# Patient Record
Sex: Female | Born: 1980
Health system: Southern US, Community
[De-identification: ages and names within clinical notes are randomized; demographics above are authoritative.]

## PROBLEM LIST (undated history)

## (undated) ENCOUNTER — Inpatient Hospital Stay (HOSPITAL_COMMUNITY): Payer: Self-pay

## (undated) DIAGNOSIS — R87629 Unspecified abnormal cytological findings in specimens from vagina: Secondary | ICD-10-CM

## (undated) DIAGNOSIS — R51 Headache: Secondary | ICD-10-CM

## (undated) DIAGNOSIS — O165 Unspecified maternal hypertension, complicating the puerperium: Secondary | ICD-10-CM

## (undated) DIAGNOSIS — E569 Vitamin deficiency, unspecified: Secondary | ICD-10-CM

## (undated) DIAGNOSIS — O26899 Other specified pregnancy related conditions, unspecified trimester: Secondary | ICD-10-CM

## (undated) DIAGNOSIS — R12 Heartburn: Secondary | ICD-10-CM

## (undated) DIAGNOSIS — D649 Anemia, unspecified: Secondary | ICD-10-CM

## (undated) DIAGNOSIS — E119 Type 2 diabetes mellitus without complications: Secondary | ICD-10-CM

## (undated) DIAGNOSIS — I498 Other specified cardiac arrhythmias: Secondary | ICD-10-CM

## (undated) DIAGNOSIS — G35 Multiple sclerosis: Secondary | ICD-10-CM

## (undated) DIAGNOSIS — K8020 Calculus of gallbladder without cholecystitis without obstruction: Secondary | ICD-10-CM

## (undated) DIAGNOSIS — I471 Supraventricular tachycardia, unspecified: Secondary | ICD-10-CM

## (undated) DIAGNOSIS — I951 Orthostatic hypotension: Secondary | ICD-10-CM

## (undated) DIAGNOSIS — D62 Acute posthemorrhagic anemia: Secondary | ICD-10-CM

## (undated) DIAGNOSIS — G90A Postural orthostatic tachycardia syndrome (POTS): Secondary | ICD-10-CM

## (undated) DIAGNOSIS — IMO0001 Reserved for inherently not codable concepts without codable children: Secondary | ICD-10-CM

## (undated) DIAGNOSIS — K589 Irritable bowel syndrome without diarrhea: Secondary | ICD-10-CM

## (undated) DIAGNOSIS — R000 Tachycardia, unspecified: Secondary | ICD-10-CM

## (undated) DIAGNOSIS — I499 Cardiac arrhythmia, unspecified: Secondary | ICD-10-CM

## (undated) DIAGNOSIS — O24419 Gestational diabetes mellitus in pregnancy, unspecified control: Secondary | ICD-10-CM

## (undated) HISTORY — DX: Irritable bowel syndrome, unspecified: K58.9

## (undated) HISTORY — DX: Calculus of gallbladder without cholecystitis without obstruction: K80.20

## (undated) HISTORY — DX: Type 2 diabetes mellitus without complications: E11.9

## (undated) HISTORY — PX: COLPOSCOPY: SHX161

## (undated) HISTORY — PX: WISDOM TOOTH EXTRACTION: SHX21

---
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
## 2000-07-03 ENCOUNTER — Encounter: Payer: Self-pay | Admitting: Emergency Medicine

## 2000-07-03 ENCOUNTER — Emergency Department (HOSPITAL_COMMUNITY): Admission: EM | Admit: 2000-07-03 | Discharge: 2000-07-03 | Payer: Self-pay | Admitting: *Deleted

## 2000-07-10 ENCOUNTER — Emergency Department (HOSPITAL_COMMUNITY): Admission: EM | Admit: 2000-07-10 | Discharge: 2000-07-10 | Payer: Self-pay | Admitting: Obstetrics and Gynecology

## 2001-08-27 HISTORY — PX: ATRIAL ABLATION SURGERY: SHX560

## 2001-10-03 ENCOUNTER — Ambulatory Visit (HOSPITAL_COMMUNITY): Admission: RE | Admit: 2001-10-03 | Discharge: 2001-10-03 | Payer: Self-pay | Admitting: Family Medicine

## 2001-10-03 ENCOUNTER — Encounter: Payer: Self-pay | Admitting: Family Medicine

## 2001-10-20 ENCOUNTER — Encounter: Admission: RE | Admit: 2001-10-20 | Discharge: 2002-01-18 | Payer: Self-pay | Admitting: Family Medicine

## 2001-12-14 ENCOUNTER — Encounter: Payer: Self-pay | Admitting: Emergency Medicine

## 2001-12-14 ENCOUNTER — Emergency Department (HOSPITAL_COMMUNITY): Admission: EM | Admit: 2001-12-14 | Discharge: 2001-12-14 | Payer: Self-pay | Admitting: Emergency Medicine

## 2002-01-20 ENCOUNTER — Other Ambulatory Visit: Admission: RE | Admit: 2002-01-20 | Discharge: 2002-01-20 | Payer: Self-pay | Admitting: Obstetrics and Gynecology

## 2002-12-24 ENCOUNTER — Other Ambulatory Visit: Admission: RE | Admit: 2002-12-24 | Discharge: 2002-12-24 | Payer: Self-pay | Admitting: *Deleted

## 2003-09-12 ENCOUNTER — Emergency Department (HOSPITAL_COMMUNITY): Admission: AD | Admit: 2003-09-12 | Discharge: 2003-09-12 | Payer: Self-pay | Admitting: Family Medicine

## 2004-01-06 ENCOUNTER — Other Ambulatory Visit: Admission: RE | Admit: 2004-01-06 | Discharge: 2004-01-06 | Payer: Self-pay | Admitting: Obstetrics and Gynecology

## 2004-02-04 ENCOUNTER — Ambulatory Visit (HOSPITAL_COMMUNITY): Admission: RE | Admit: 2004-02-04 | Discharge: 2004-02-06 | Payer: Self-pay | Admitting: Internal Medicine

## 2004-02-10 ENCOUNTER — Ambulatory Visit (HOSPITAL_COMMUNITY): Admission: RE | Admit: 2004-02-10 | Discharge: 2004-02-10 | Payer: Self-pay | Admitting: Internal Medicine

## 2005-02-18 ENCOUNTER — Inpatient Hospital Stay (HOSPITAL_COMMUNITY): Admission: AD | Admit: 2005-02-18 | Discharge: 2005-02-18 | Payer: Self-pay | Admitting: Obstetrics and Gynecology

## 2005-04-23 ENCOUNTER — Inpatient Hospital Stay (HOSPITAL_COMMUNITY): Admission: RE | Admit: 2005-04-23 | Discharge: 2005-04-26 | Payer: Self-pay | Admitting: Obstetrics and Gynecology

## 2005-04-27 ENCOUNTER — Encounter: Admission: RE | Admit: 2005-04-27 | Discharge: 2005-05-26 | Payer: Self-pay | Admitting: Obstetrics and Gynecology

## 2005-05-27 ENCOUNTER — Encounter: Admission: RE | Admit: 2005-05-27 | Discharge: 2005-06-26 | Payer: Self-pay | Admitting: Obstetrics and Gynecology

## 2005-06-27 ENCOUNTER — Encounter: Admission: RE | Admit: 2005-06-27 | Discharge: 2005-07-26 | Payer: Self-pay | Admitting: Obstetrics and Gynecology

## 2005-07-27 ENCOUNTER — Encounter: Admission: RE | Admit: 2005-07-27 | Discharge: 2005-08-26 | Payer: Self-pay | Admitting: Obstetrics and Gynecology

## 2005-08-27 ENCOUNTER — Encounter: Admission: RE | Admit: 2005-08-27 | Discharge: 2005-09-26 | Payer: Self-pay | Admitting: Obstetrics and Gynecology

## 2005-09-27 ENCOUNTER — Encounter: Admission: RE | Admit: 2005-09-27 | Discharge: 2005-10-24 | Payer: Self-pay | Admitting: Obstetrics and Gynecology

## 2005-10-25 ENCOUNTER — Encounter: Admission: RE | Admit: 2005-10-25 | Discharge: 2005-11-24 | Payer: Self-pay | Admitting: Obstetrics and Gynecology

## 2005-11-25 ENCOUNTER — Encounter: Admission: RE | Admit: 2005-11-25 | Discharge: 2005-12-24 | Payer: Self-pay | Admitting: Obstetrics and Gynecology

## 2005-12-25 ENCOUNTER — Encounter: Admission: RE | Admit: 2005-12-25 | Discharge: 2006-01-24 | Payer: Self-pay | Admitting: Obstetrics and Gynecology

## 2006-01-25 ENCOUNTER — Encounter: Admission: RE | Admit: 2006-01-25 | Discharge: 2006-02-24 | Payer: Self-pay | Admitting: Obstetrics and Gynecology

## 2006-05-05 ENCOUNTER — Emergency Department (HOSPITAL_COMMUNITY): Admission: EM | Admit: 2006-05-05 | Discharge: 2006-05-05 | Payer: Self-pay | Admitting: Emergency Medicine

## 2006-12-16 ENCOUNTER — Inpatient Hospital Stay (HOSPITAL_COMMUNITY): Admission: EM | Admit: 2006-12-16 | Discharge: 2006-12-16 | Payer: Self-pay | Admitting: Emergency Medicine

## 2007-01-02 ENCOUNTER — Emergency Department (HOSPITAL_COMMUNITY): Admission: EM | Admit: 2007-01-02 | Discharge: 2007-01-02 | Payer: Self-pay | Admitting: Emergency Medicine

## 2008-05-17 ENCOUNTER — Ambulatory Visit (HOSPITAL_COMMUNITY): Admission: RE | Admit: 2008-05-17 | Discharge: 2008-05-17 | Payer: Self-pay | Admitting: Neurology

## 2008-06-02 ENCOUNTER — Ambulatory Visit (HOSPITAL_COMMUNITY): Admission: RE | Admit: 2008-06-02 | Discharge: 2008-06-02 | Payer: Self-pay | Admitting: Neurology

## 2008-06-05 ENCOUNTER — Emergency Department (HOSPITAL_COMMUNITY): Admission: EM | Admit: 2008-06-05 | Discharge: 2008-06-05 | Payer: Self-pay | Admitting: Emergency Medicine

## 2008-06-07 ENCOUNTER — Encounter: Admission: RE | Admit: 2008-06-07 | Discharge: 2008-06-07 | Payer: Self-pay | Admitting: Neurology

## 2009-07-01 ENCOUNTER — Emergency Department (HOSPITAL_COMMUNITY): Admission: EM | Admit: 2009-07-01 | Discharge: 2009-07-02 | Payer: Self-pay | Admitting: Emergency Medicine

## 2009-08-16 ENCOUNTER — Ambulatory Visit (HOSPITAL_COMMUNITY): Admission: RE | Admit: 2009-08-16 | Discharge: 2009-08-16 | Payer: Self-pay | Admitting: Gastroenterology

## 2010-07-11 ENCOUNTER — Ambulatory Visit (HOSPITAL_COMMUNITY): Admission: RE | Admit: 2010-07-11 | Discharge: 2010-07-11 | Payer: Self-pay | Admitting: Neurology

## 2010-10-16 ENCOUNTER — Other Ambulatory Visit (HOSPITAL_COMMUNITY): Payer: Self-pay | Admitting: Neurology

## 2010-10-16 DIAGNOSIS — R42 Dizziness and giddiness: Secondary | ICD-10-CM

## 2010-10-20 ENCOUNTER — Ambulatory Visit (HOSPITAL_COMMUNITY)
Admission: RE | Admit: 2010-10-20 | Discharge: 2010-10-20 | Disposition: A | Discharge status: Home / Self Care | Payer: 59 | Source: Ambulatory Visit | Attending: Neurology | Admitting: Neurology

## 2010-10-20 DIAGNOSIS — R42 Dizziness and giddiness: Secondary | ICD-10-CM

## 2010-10-20 DIAGNOSIS — G35 Multiple sclerosis: Secondary | ICD-10-CM | POA: Insufficient documentation

## 2010-10-20 DIAGNOSIS — R209 Unspecified disturbances of skin sensation: Secondary | ICD-10-CM | POA: Insufficient documentation

## 2010-10-20 DIAGNOSIS — M5020 Other cervical disc displacement, unspecified cervical region: Secondary | ICD-10-CM | POA: Insufficient documentation

## 2010-10-20 MED ORDER — GADOBENATE DIMEGLUMINE 529 MG/ML IV SOLN
13.0000 mL | Freq: Once | INTRAVENOUS | Status: AC | PRN
  Administered 2010-10-20: 13 mL via INTRAVENOUS

## 2010-10-20 MED ORDER — GADOBENATE DIMEGLUMINE 529 MG/ML IV SOLN: 13.0000 mL | Freq: Once | INTRAVENOUS | Status: DC | PRN

## 2010-11-29 LAB — CBC
HCT: 41.6 % (ref 36.0–46.0)
Hemoglobin: 14.4 g/dL (ref 12.0–15.0)
MCHC: 34.7 g/dL (ref 30.0–36.0)
MCV: 94.7 fL (ref 78.0–100.0)
Platelets: 144 10*3/uL — ABNORMAL LOW (ref 150–400)
RBC: 4.39 MIL/uL (ref 3.87–5.11)
RDW: 12.6 % (ref 11.5–15.5)
WBC: 7.8 10*3/uL (ref 4.0–10.5)

## 2010-11-29 LAB — URINALYSIS, ROUTINE W REFLEX MICROSCOPIC
Bilirubin Urine: NEGATIVE
Glucose, UA: NEGATIVE mg/dL
Hgb urine dipstick: NEGATIVE
Nitrite: NEGATIVE
Protein, ur: NEGATIVE mg/dL
Specific Gravity, Urine: 1.018 (ref 1.005–1.030)
Urobilinogen, UA: 1 mg/dL (ref 0.0–1.0)
pH: 5.5 (ref 5.0–8.0)

## 2010-11-29 LAB — DIFFERENTIAL
Basophils Absolute: 0 10*3/uL (ref 0.0–0.1)
Basophils Relative: 0 % (ref 0–1)
Eosinophils Absolute: 0.2 10*3/uL (ref 0.0–0.7)
Eosinophils Relative: 2 % (ref 0–5)
Lymphocytes Relative: 13 % (ref 12–46)
Lymphs Abs: 1 10*3/uL (ref 0.7–4.0)
Monocytes Absolute: 0.5 10*3/uL (ref 0.1–1.0)
Monocytes Relative: 7 % (ref 3–12)
Neutro Abs: 6.1 10*3/uL (ref 1.7–7.7)
Neutrophils Relative %: 78 % — ABNORMAL HIGH (ref 43–77)

## 2010-11-29 LAB — COMPREHENSIVE METABOLIC PANEL
ALT: 14 U/L (ref 0–35)
AST: 18 U/L (ref 0–37)
Albumin: 4.1 g/dL (ref 3.5–5.2)
Alkaline Phosphatase: 49 U/L (ref 39–117)
BUN: 8 mg/dL (ref 6–23)
CO2: 27 mEq/L (ref 19–32)
Calcium: 8.7 mg/dL (ref 8.4–10.5)
Chloride: 101 mEq/L (ref 96–112)
Creatinine, Ser: 0.78 mg/dL (ref 0.4–1.2)
GFR calc non Af Amer: >60 mL/min (ref >60)
Glucose, Bld: 108 mg/dL — ABNORMAL HIGH (ref 70–99)
Potassium: 3.8 mEq/L (ref 3.5–5.1)
Sodium: 135 mEq/L (ref 135–145)
Total Bilirubin: 2.1 mg/dL — ABNORMAL HIGH (ref 0.3–1.2)
Total Protein: 6.9 g/dL (ref 6.0–8.3)

## 2010-11-29 LAB — URINE MICROSCOPIC-ADD ON

## 2010-11-29 LAB — PREGNANCY, URINE

## 2011-01-12 NOTE — Discharge Summary (Signed)
NAME:  Karen Powers, Karen Powers NO.:  [id number]   MEDICAL RECORD NO.:  [id number]          PATIENT TYPE:  INP   LOCATION:  9118                          FACILITY:  [facility]   PHYSICIAN:  Lenoard Aden, M.D.DATE OF BIRTH:  12/28/1980   DATE OF ADMISSION:  04/23/2005  DATE OF DISCHARGE:  04/26/2005                                 DISCHARGE SUMMARY   The patient underwent an uncomplicated primary cesarean section for  persistent breech at term on April 21, 2005. Postoperative course  uncomplicated.  Tolerated diet well.  Discharged home on day #3.  Discharge  teaching done.  Tylox, prenatal vitamins and iron given.  Hospital course  uncomplicated.  Follow up in the office in four to six weeks.      Lenoard Aden, M.D.  Electronically Signed     RJT/MEDQ  D:  06/24/2005  T:  06/25/2005  Job:  [id number]

## 2011-01-12 NOTE — Op Note (Signed)
NAME:  Karen Powers, Karen Powers NO.:  [id number]   MEDICAL RECORD NO.:  [id number]                   PATIENT TYPE:  OIB   LOCATION:  2027                                 FACILITY:  [facility]   PHYSICIAN:  Duke Salvia, M.D.               DATE OF BIRTH:  02-May-1981   DATE OF PROCEDURE:  02/04/2004  DATE OF DISCHARGE:                                 OPERATIVE REPORT   PREOPERATIVE DIAGNOSIS:  Long RP tachycardia.   POSTOPERATIVE DIAGNOSIS:  No inducible arrhythmias.   PROCEDURE:  Invasive electrophysiological study, isoproterenol infusion and  arrhythmia mapping.   SURGEON:  Duke Salvia, M.D.   Following obtaining informed consent, the patient was brought to the  electrophysiology laboratory and placed on the fluoroscopic table in supine  position.  After routine prep and drape, cardiac catheterization was  performed with local anesthesia and conscious sedation.  Noninvasive blood  pressure monitoring, transcutaneous oxygen saturation monitoring and  nontidal CO2 monitoring were performed and remained continued stable  throughout the procedure.   Following the procedure, the catheters were removed, hemostasis was obtained  and the patient was transferred to the floor in stable condition.   CATHETERS:  1. The catheters used were 5-French catheter inserted into the left femoral     vein via the AV junction to measure the His electrogram.  2. A 9-French ESI endocardial mapping array was inserted via the left     femoral vein to the right atrium.  3. A 6-French octapolar catheter was inserted via the right femoral vein to     the coronary sinus.  4. A 7-French 5-mm deflectable tip ablation catheter was then inserted into     the right atrium for creation of a geometry and mapping.  5. A 5-French His catheter was subsequently removed to the right ventricular     apex.   Service leads I, AVF and V1 were monitored continuously throughout the  procedure.   Following insertion of the catheters, a stimulation protocol was  initiated incremental atrial pacing.   Incremental ventricular pacing.   Single atrial extrastimuli base cycling length of 600 milliseconds.   Double atriotomy stimuli the presence/absence of isoproterenol at pacing  cycle length of 403, 50 milliseconds.   Burst atrial pacing from 300 to 200 milliseconds.   Single ventricular extrastimuli pace cycle length of 400 milliseconds.   RESULTS:  1. Service electrocardiogram basic intervals.     A. Sinus.     B. Cycle length 584 milliseconds.     C. P-R interval 140 milliseconds.     D. QRS duration is 77 milliseconds.     E. Q-T interval is 353 milliseconds.     F. P-wave duration is 115 milliseconds.     G. Bundle branch block was absent.     H. Pre-excitation was absent.  2. A-H interval was 57 milliseconds.  3. H-V interval was 44 milliseconds.  4. His duration was 14 milliseconds.   AV NODAL FUNCTION:  1. The AV Wenckebach cycle length was 350 milliseconds.  2. VA Wenckebach cycle length was less than 400 milliseconds.  3. The AV nodal cycle length for a fraction appeared base cycle 600     milliseconds with 250 milliseconds and AV conduction was continuous, both     single and double atrial extrastimuli.  4. No accessory pathway was evident.   ARRHYTHMIAS INDUCED:  No sustained arrhythmia could be induced in the  presence or in the absence of isoproterenol with the above-mentioned  stimulation protocol with isoproterenol graded from 1 to 4 mcg per minute.   IMPRESSION:  1. Arrhythmias were induced, appeared to have quite limited to 3-4 beats and     appeared to have an origin in this sinus node area.  This was     demonstrated by both electrogram mapping as well as endocardial array     mapping.  2. Inappropriate sinus tachycardias.  3. Normal atrial function without inducible arrhythmia.  4. Normal AV nodal function without dual AV nodal physiology.  5.  No accessory pathways.  6. Normal __________ system function.  7. No normal ventricular response to program stimulation.   CONCLUSION:  The results of the electrophysiologic testing failed to  identify an arrhythmia mechanism, the patient's tachy palpitations  notwithstanding the fact that the event monitor had shown the transition of  heart rates from cycle length of about 800 to 450 milliseconds over 5 to 10  seconds.  No specific abnormality could be identified on the testing.   RECOMMENDATIONS:  1. Salt and water repletion.  2. Assess for dysautonomia.  3. Beta blockers as necessary.                                               Duke Salvia, M.D.    SCK/MEDQ  D:  02/04/2004  T:  02/05/2004  Job:  [id number]   cc:   Cristy Hilts. Jacinto Halim, M.D.  [address]  [place]  [place] [address]  Fax: [phone]   Electrophysiology Lab

## 2011-01-12 NOTE — Op Note (Signed)
NAME:  Karen Powers, Karen Powers NO.:  [id number]   MEDICAL RECORD NO.:  [id number]                   PATIENT TYPE:  OIB   LOCATION:  2899                                 FACILITY:  [facility]   PHYSICIAN:  Duke Salvia, M.D.               DATE OF BIRTH:  04-Jun-1981   DATE OF PROCEDURE:  02/11/2004  DATE OF DISCHARGE:  02/10/2004                                 OPERATIVE REPORT   POSTOPERATIVE DIAGNOSES:  1. Syncope.  2. Tachypalpitations.   POSTOPERATIVE DIAGNOSIS:  Postural orthostatic tachycardia.   Following equilibration in the supine position with blood pressures about  110 with a pulse of about 75, the patient was subjected to head-up tilt  table testing.  After about 10 to 12 minutes, her heart rate had escalated  to the 120 range without significant change in blood pressure.  The  patient's heart rate then gradually returned to normal.  These symptoms  associated with the tachycardia reproduced her clinical symptoms.   IMPRESSION:  Postural orthostatic tachycardia.   RECOMMENDATIONS:  Salt and water repletion and consideration of beta  blockade.                                               Duke Salvia, M.D.    SCK/MEDQ  D:  02/10/2004  T:  02/11/2004  Job:  [id number]   cc:   Cristy Hilts. Jacinto Halim, M.D.  [address]  [place]  [place] [address]  Fax: [phone]

## 2011-01-12 NOTE — Op Note (Signed)
NAME:  Karen Powers, Karen Powers NO.:  [id number]   MEDICAL RECORD NO.:  [id number]          PATIENT TYPE:  INP   LOCATION:  9118                          [facility]   PHYSICIAN:  Lenoard Aden, M.D.DATE OF BIRTH:  01/05/1981   DATE OF PROCEDURE:  DATE OF DISCHARGE:                                 OPERATIVE REPORT   PREOPERATIVE DIAGNOSIS:  A 39-week intrauterine pregnancy with breech mal  presentation.   POSTOPERATIVE DIAGNOSIS:  A 39-week intrauterine pregnancy with breech mal  presentation.   PROCEDURE:  Primary low segment transverse cesarean section.   ASSISTANT:  Pershing Cox, M.D.   ANESTHESIA:  Spinal by Dr. Arby Barrette.   ESTIMATED BLOOD LOSS:  1000 mL.   COMPLICATIONS:  None.   DRAINS:  Foley.   DISPOSITION:  Patient went to recovery in good condition.   FINDINGS:  Full-term living female 8 pounds 14 ounces in frank breech  position.  Cord blood collected.  Placenta posterior.  Normal tubes, normal  ovaries.   BRIEF OPERATIVE NOTE:  Being apprised of the risks of anesthesia, infection,  bleeding, intra-abdominal injury with need for repair, the delayed risks,  and immediate complications to include bowel and bladder injury, the patient  is brought to the operating room.  She is administered a spinal anesthetic  without complications.  Prepped and draped in the usual sterile fashion.  Foley catheter placed.  After achieving adequate anesthesia, dilute Marcaine  solution placed in the area of the Pfannenstiel skin incision, which was  made with the scalpel and carried down to the fascia which was nicked in the  midline and opened transversely using the Mayo scissors.  Rectus muscles  dissected sharply in the midline and bladder flap created using sharp  dissection and dissected sharply off the lower uterine segment.  Curved  hysterotomy incision made.  Atraumatic delivery using the usual maneuvers  with a full-term living female handed to the  pediatricians in attendance.  Apgars 8 and 9.  Cord blood collected.  Placenta remained intact.  Three-  vessel cord noted.  Uterus is closed in two layers after being curetted and  a dry laparotomy pad used.  It is closed in two layers using the 0 Monocryl  suture.  Normal tubes and ovaries noted as previously  noted.  The bladder flap was inspected and found to be hemostatic.  Irrigation accomplished.  The fascia closed using a 0 Monocryl.  Skin closed  with staples.  The patient tolerated the procedure well and was transferred  to the recovery room in good condition.      Lenoard Aden, M.D.  Electronically Signed     RJT/MEDQ  D:  04/23/2005  T:  04/23/2005  Job:  [id number]   cc:   Marvel Plan

## 2011-01-12 NOTE — H&P (Signed)
NAME:  Karen Powers, Karen Powers NO.:  [id number]   MEDICAL RECORD NO.:  [id number]          PATIENT TYPE:  INP   LOCATION:  NA                            [facility]   PHYSICIAN:  Lenoard Aden, M.D.DATE OF BIRTH:  1981/01/09   DATE OF ADMISSION:  DATE OF DISCHARGE:                                HISTORY & PHYSICAL   CHIEF COMPLAINT:  Breech presentation at 39 weeks, desire for primary  cesarean section.   HISTORY OF PRESENT ILLNESS:  The patient is a 30 year old white female,  gravida 2, para 2, EDD April 28, 2005, at 39+ weeks with persistent  breech presentation at term for primary cesarean section.   MEDICATIONS:  Prenatal vitamins and prednisone.   PAST MEDICAL HISTORY:  Remarkable for supraventricular tachycardia currently  on no medications.  History of third trimester pregnancy-related itching  currently on prednisone Dosepak.  The patient has a personal of an SAB with  no D&C.  She is a nonsmoker and nondrinker.  She denies domestic or physical  violence.  She has a personal history of cardiac ablation.  She has a family  history of alcohol abuse, colon and lung cancer, cerebral hemorrhage,  multiple sclerosis, adult onset diabetes mellitus.  She has a personal  history of postural orthostatic hypotension.   PRENATAL LABORATORY DATA:  Blood type O negative, Rh antibody negative,  rubella immune, hepatitis and HIV nonreactive, GBS is negative.   PREGNANCY COURSE:  Complicated by preterm contractions and persistent breech  presentation.   PHYSICAL EXAMINATION:  GENERAL:  She is a well-developed, well-nourished  white female in no acute distress.  HEENT:  Normal.  LUNGS:  Clear.  HEART:  Regular rate and rhythm.  ABDOMEN:  Soft, gravid, and nontender.  Estimated fetal weight of 7-1/2 to 8  pounds.  PELVIC:  Cervix is closed, 2 cm long, breech, and -2.  EXTREMITIES:  No cords.  NEUROLOGY:  Nonfocal.   IMPRESSION:  1.  39-week obstetric.  2.   Persistent breech presentation.  3.  History of postural orthostatic tachycardia, status post cardiac      ablation.  4.  Rh negative.  5.  History of third trimester itching, currently on prednisone.   PLAN:  Proceed with primary cesarean section.  Risks, benefits discussed.  Risks of anesthesia, infection, bleeding, injury to abdominal organs with  need for repair noted, delayed versus immediate complications to include  bowel and bladder injury noted.  The patient acknowledges and declines  external cephalic version and wishes to proceed.      Lenoard Aden, M.D.  Electronically Signed     RJT/MEDQ  D:  04/22/2005  T:  04/22/2005  Job:  [id number]

## 2011-03-22 ENCOUNTER — Ambulatory Visit: Payer: No Typology Code available for payment source | Attending: Family Medicine | Admitting: Physical Therapy

## 2011-03-22 DIAGNOSIS — IMO0001 Reserved for inherently not codable concepts without codable children: Secondary | ICD-10-CM | POA: Insufficient documentation

## 2011-03-22 DIAGNOSIS — M545 Low back pain, unspecified: Secondary | ICD-10-CM | POA: Insufficient documentation

## 2011-03-22 DIAGNOSIS — M2560 Stiffness of unspecified joint, not elsewhere classified: Secondary | ICD-10-CM | POA: Insufficient documentation

## 2011-03-22 DIAGNOSIS — R5381 Other malaise: Secondary | ICD-10-CM | POA: Insufficient documentation

## 2011-03-26 ENCOUNTER — Ambulatory Visit: Payer: No Typology Code available for payment source | Admitting: Physical Therapy

## 2011-03-29 ENCOUNTER — Ambulatory Visit: Payer: No Typology Code available for payment source | Attending: Family Medicine | Admitting: Physical Therapy

## 2011-03-29 DIAGNOSIS — R5381 Other malaise: Secondary | ICD-10-CM | POA: Insufficient documentation

## 2011-03-29 DIAGNOSIS — M2560 Stiffness of unspecified joint, not elsewhere classified: Secondary | ICD-10-CM | POA: Insufficient documentation

## 2011-03-29 DIAGNOSIS — IMO0001 Reserved for inherently not codable concepts without codable children: Secondary | ICD-10-CM | POA: Insufficient documentation

## 2011-03-29 DIAGNOSIS — M545 Low back pain, unspecified: Secondary | ICD-10-CM | POA: Insufficient documentation

## 2011-04-02 ENCOUNTER — Ambulatory Visit: Payer: No Typology Code available for payment source | Admitting: *Deleted

## 2011-04-05 ENCOUNTER — Ambulatory Visit: Payer: No Typology Code available for payment source | Admitting: Physical Therapy

## 2011-04-09 ENCOUNTER — Ambulatory Visit: Payer: No Typology Code available for payment source | Admitting: Physical Therapy

## 2011-04-12 ENCOUNTER — Ambulatory Visit: Payer: No Typology Code available for payment source | Admitting: Physical Therapy

## 2011-04-16 ENCOUNTER — Ambulatory Visit: Payer: No Typology Code available for payment source | Admitting: Physical Therapy

## 2011-04-19 ENCOUNTER — Encounter: Payer: Commercial Managed Care - PPO | Admitting: Physical Therapy

## 2011-05-28 LAB — OLIGOCLONAL BANDS, CSF + SERM
Albumin Index: 4 ratio (ref 0.0–9.0)
Albumin, CSF: 17 mg/dL (ref 0–35)
Albumin, Serum(Neph): 4260 mg/dL (ref 3500–5200)
CSF Oligoclonal Bands: POSITIVE
IgG Index, CSF: 1.41 ratio — ABNORMAL HIGH (ref 0.28–0.66)
IgG, CSF: 5.6 mg/dL (ref 0.0–6.0)
IgG, Serum: 995 mg/dL (ref 768–1632)
IgG/Albumin Ratio, CSF: 0.33 ratio — ABNORMAL HIGH (ref 0.09–0.25)
MS CNS IgG Synthesis Rate: 15.4 mg/d — ABNORMAL HIGH (ref <=8.0)

## 2011-05-28 LAB — VDRL, CSF: VDRL Quant, CSF: NONREACTIVE

## 2011-05-28 LAB — CSF CELL COUNT WITH DIFFERENTIAL
Eosinophils, CSF: 0
Other Cells, CSF: 0
RBC Count, CSF: 270 — ABNORMAL HIGH
Tube #: 2
WBC, CSF: 4

## 2011-05-28 LAB — PROTEIN AND GLUCOSE, CSF
Glucose, CSF: 44
Total  Protein, CSF: 39

## 2011-08-20 ENCOUNTER — Emergency Department (INDEPENDENT_AMBULATORY_CARE_PROVIDER_SITE_OTHER): Payer: 59

## 2011-08-20 ENCOUNTER — Encounter: Payer: Self-pay | Admitting: Emergency Medicine

## 2011-08-20 ENCOUNTER — Emergency Department (HOSPITAL_COMMUNITY)
Admission: EM | Admit: 2011-08-20 | Discharge: 2011-08-20 | Disposition: A | Discharge status: Home / Self Care | Payer: 59 | Source: Home / Self Care | Attending: Family Medicine | Admitting: Family Medicine

## 2011-08-20 DIAGNOSIS — J111 Influenza due to unidentified influenza virus with other respiratory manifestations: Secondary | ICD-10-CM

## 2011-08-20 HISTORY — DX: Supraventricular tachycardia: I47.1

## 2011-08-20 HISTORY — DX: Multiple sclerosis: G35

## 2011-08-20 HISTORY — DX: Supraventricular tachycardia, unspecified: I47.10

## 2011-08-20 MED ORDER — GUAIFENESIN-CODEINE 100-10 MG/5ML PO SYRP: 10.0000 mL | ORAL_SOLUTION | Freq: Three times a day (TID) | ORAL | Status: AC | PRN

## 2011-08-20 MED ORDER — AZITHROMYCIN 250 MG PO TABS: ORAL_TABLET | ORAL | Status: AC

## 2011-08-20 NOTE — ED Notes (Signed)
PT HERE WITH COLD SX THAT HAS PROGRESSED WITH COUGH WITH GREENISH PHLEGM,R FLANK PAIN FROM COUGHING AND SORE THROAT,FEVER.TEMP ON ARRIVAL 100.5 H/R 167

## 2011-08-20 NOTE — ED Provider Notes (Signed)
History     CSN: [id number]  Arrival date & time 08/20/11  4098   First MD Initiated Contact with Patient 08/20/11 1032      Chief Complaint  Patient presents with  . URI  . Influenza    (Consider location/radiation/quality/duration/timing/severity/associated sxs/prior treatment) Patient is a 30 y.o. female presenting with URI and flu symptoms. The history is provided by the patient.  URI The primary symptoms include fever and cough. The current episode started 3 to 5 days ago. This is a new problem. The problem has not changed since onset. Symptoms associated with the illness include chills, congestion and rhinorrhea.  Influenza    Past Medical History  Diagnosis Date  . SVT (supraventricular tachycardia)   . MS (multiple sclerosis)     Past Surgical History  Procedure Date  . Atrial ablation surgery 2003    FAILED    No family history on file.  History  Substance Use Topics  . Smoking status: Never Smoker   . Smokeless tobacco: Not on file  . Alcohol Use: No    OB History    Grav Para Term Preterm Abortions TAB SAB Ect Mult Living                  Review of Systems  Constitutional: Positive for fever and chills.  HENT: Positive for congestion, rhinorrhea and postnasal drip.   Eyes: Negative.   Respiratory: Positive for cough.   Cardiovascular: Negative.   Gastrointestinal: Negative.   Skin: Negative.     Allergies  Review of patient's allergies indicates no known allergies.  Home Medications   Current Outpatient Rx  Name Route Sig Dispense Refill  . GLATIRAMER ACETATE 20 MG/ML [place] KIT Subcutaneous Inject 20 mg into the skin daily. LAST INJECTION X 2WKS AGO     . METHYLPHENIDATE HCL 10 MG PO TABS Oral Take 10 mg by mouth 2 (two) times daily.      . AZITHROMYCIN 250 MG PO TABS  Take as directed on pack 6 each 0  . GUAIFENESIN-CODEINE 100-10 MG/5ML PO SYRP Oral Take 10 mLs by mouth 3 (three) times daily as needed for cough. 180 mL 0    BP  104/75  Pulse 167  Temp(Src) 100.5 F (38.1 C) (Oral)  Resp 20  SpO2 99%  LMP 08/01/2011  Physical Exam  Nursing note and vitals reviewed. Constitutional: She is oriented to person, place, and time. She appears well-developed and well-nourished.  HENT:  Head: Normocephalic.  Right Ear: External ear normal.  Left Ear: External ear normal.  Mouth/Throat: Oropharynx is clear and moist.  Eyes: Conjunctivae are normal. Pupils are equal, round, and reactive to light.  Neck: Normal range of motion. Neck supple.  Cardiovascular: Normal rate, normal heart sounds and intact distal pulses.   Pulmonary/Chest: Effort normal. She has rales.  Abdominal: Soft. Bowel sounds are normal.  Lymphadenopathy:    She has no cervical adenopathy.  Neurological: She is alert and oriented to person, place, and time.  Skin: Skin is warm and dry.    ED Course  Procedures (including critical care time)  Labs Reviewed - No data to display Dg Chest 2 View  08/20/2011  *RADIOLOGY REPORT*  Clinical Data: Cough, fever and chest pain.  CHEST - 2 VIEW  Comparison: None.  Findings: Lungs are clear.  Heart size is normal.  No pneumothorax or effusion.  IMPRESSION: Negative chest.  Original Report Authenticated By: Bernadene Bell. D'ALESSIO, M.D.     1. Bronchitis with  influenza       MDM  X-rays reviewed and report per radiologist.         Barkley Bruns, MD 08/20/11 [phone]

## 2011-09-07 ENCOUNTER — Ambulatory Visit (INDEPENDENT_AMBULATORY_CARE_PROVIDER_SITE_OTHER): Payer: Self-pay | Admitting: Pharmacist

## 2011-09-07 ENCOUNTER — Encounter: Payer: Self-pay | Admitting: Pharmacist

## 2011-09-07 DIAGNOSIS — N390 Urinary tract infection, site not specified: Secondary | ICD-10-CM | POA: Insufficient documentation

## 2011-09-07 DIAGNOSIS — I471 Supraventricular tachycardia: Secondary | ICD-10-CM | POA: Insufficient documentation

## 2011-09-07 DIAGNOSIS — G35 Multiple sclerosis: Secondary | ICD-10-CM | POA: Insufficient documentation

## 2011-09-07 DIAGNOSIS — G43909 Migraine, unspecified, not intractable, without status migrainosus: Secondary | ICD-10-CM | POA: Insufficient documentation

## 2011-09-07 NOTE — Assessment & Plan Note (Signed)
Following medication review, no suggestions for change.  Complete medication list provided to patient.  Total time in face to face medication review: 30 minutes.  Patient seen with: Nick Reid, PharmD Candidate and Eddie Saito, Pharmacy Resident 

## 2011-09-07 NOTE — Progress Notes (Signed)
  Subjective:    Patient ID: Karen Powers, female    DOB: 03-28-1981, 31 y.o.   MRN: [id number]  HPI  Patient arrives in good spirits for medication review.  Reports seeing Dr. Elias Else [facility]) as primary care provider, Dr. Yates Decamp as cardiologist, Dr. Harlen Labs as neurologist.  Reports being diagnosed with MS in 2009 and states she is under acceptable level of control.     Review of Systems     Objective:   Physical Exam        Assessment & Plan:  Following medication review, no suggestions for change.  Complete medication list provided to patient.  Total time in face to face medication review: 30 minutes.  Patient seen with: Tamala Bari, PharmD Candidate and Albertine Grates, Pharmacy Resident

## 2011-09-07 NOTE — Progress Notes (Signed)
  Subjective:    Patient ID: Karen Powers, female    DOB: [birth year], 31 y.o.   MRN: [id number]  HPI Reviewed and agree with Dr. Macky Lower management    Review of Systems     Objective:   Physical Exam        Assessment & Plan:

## 2012-12-25 ENCOUNTER — Emergency Department (HOSPITAL_COMMUNITY)
Admission: EM | Admit: 2012-12-25 | Discharge: 2012-12-26 | Disposition: A | Discharge status: Home / Self Care | Payer: 59 | Attending: Emergency Medicine | Admitting: Emergency Medicine

## 2012-12-25 ENCOUNTER — Emergency Department (HOSPITAL_COMMUNITY): Payer: 59

## 2012-12-25 ENCOUNTER — Encounter (HOSPITAL_COMMUNITY): Payer: Self-pay | Admitting: Emergency Medicine

## 2012-12-25 DIAGNOSIS — Z79899 Other long term (current) drug therapy: Secondary | ICD-10-CM | POA: Insufficient documentation

## 2012-12-25 DIAGNOSIS — Z8669 Personal history of other diseases of the nervous system and sense organs: Secondary | ICD-10-CM | POA: Insufficient documentation

## 2012-12-25 DIAGNOSIS — R509 Fever, unspecified: Secondary | ICD-10-CM | POA: Insufficient documentation

## 2012-12-25 DIAGNOSIS — Z7982 Long term (current) use of aspirin: Secondary | ICD-10-CM | POA: Insufficient documentation

## 2012-12-25 DIAGNOSIS — R300 Dysuria: Secondary | ICD-10-CM | POA: Insufficient documentation

## 2012-12-25 DIAGNOSIS — Z3202 Encounter for pregnancy test, result negative: Secondary | ICD-10-CM | POA: Insufficient documentation

## 2012-12-25 DIAGNOSIS — I498 Other specified cardiac arrhythmias: Secondary | ICD-10-CM | POA: Insufficient documentation

## 2012-12-25 LAB — URINALYSIS, ROUTINE W REFLEX MICROSCOPIC
Bilirubin Urine: NEGATIVE
Glucose, UA: NEGATIVE mg/dL
Ketones, ur: NEGATIVE mg/dL
Nitrite: NEGATIVE
Protein, ur: 30 mg/dL — AB
Specific Gravity, Urine: 1.019 (ref 1.005–1.030)
Urobilinogen, UA: 0.2 mg/dL (ref 0.0–1.0)
pH: 5.5 (ref 5.0–8.0)

## 2012-12-25 LAB — BASIC METABOLIC PANEL
BUN: 6 mg/dL (ref 6–23)
CO2: 22 mEq/L (ref 19–32)
Calcium: 8.8 mg/dL (ref 8.4–10.5)
Chloride: 98 mEq/L (ref 96–112)
Creatinine, Ser: 0.74 mg/dL (ref 0.50–1.10)
GFR calc Af Amer: >90 mL/min (ref >90)
GFR calc non Af Amer: >90 mL/min (ref >90)
Glucose, Bld: 91 mg/dL (ref 70–99)
Potassium: 3.4 mEq/L — ABNORMAL LOW (ref 3.5–5.1)
Sodium: 133 mEq/L — ABNORMAL LOW (ref 135–145)

## 2012-12-25 LAB — URINE MICROSCOPIC-ADD ON

## 2012-12-25 LAB — CBC WITH DIFFERENTIAL/PLATELET
Basophils Absolute: 0 10*3/uL (ref 0.0–0.1)
Basophils Relative: 0 % (ref 0–1)
Eosinophils Absolute: 0 10*3/uL (ref 0.0–0.7)
Eosinophils Relative: 0 % (ref 0–5)
HCT: 38.5 % (ref 36.0–46.0)
Hemoglobin: 13.4 g/dL (ref 12.0–15.0)
Lymphocytes Relative: 13 % (ref 12–46)
Lymphs Abs: 0.7 10*3/uL (ref 0.7–4.0)
MCH: 31.6 pg (ref 26.0–34.0)
MCHC: 34.8 g/dL (ref 30.0–36.0)
MCV: 90.8 fL (ref 78.0–100.0)
Monocytes Absolute: 0.7 10*3/uL (ref 0.1–1.0)
Monocytes Relative: 12 % (ref 3–12)
Neutro Abs: 4.3 10*3/uL (ref 1.7–7.7)
Neutrophils Relative %: 75 % (ref 43–77)
Platelets: 138 10*3/uL — ABNORMAL LOW (ref 150–400)
RBC: 4.24 MIL/uL (ref 3.87–5.11)
RDW: 13 % (ref 11.5–15.5)
WBC: 5.8 10*3/uL (ref 4.0–10.5)

## 2012-12-25 LAB — LACTIC ACID, PLASMA: Lactic Acid, Venous: 0.9 mmol/L (ref 0.5–2.2)

## 2012-12-25 LAB — PREGNANCY, URINE: Preg Test, Ur: NEGATIVE

## 2012-12-25 MED ORDER — ACETAMINOPHEN 325 MG PO TABS
650.0000 mg | ORAL_TABLET | Freq: Once | ORAL | Status: AC
  Administered 2012-12-25: 650 mg via ORAL
  Filled 2012-12-25: qty 2

## 2012-12-25 MED ORDER — DEXTROSE 5 % IV SOLN
1.0000 g | Freq: Once | INTRAVENOUS | Status: AC
  Administered 2012-12-25: 1 g via INTRAVENOUS
  Filled 2012-12-25: qty 10

## 2012-12-25 MED ORDER — SODIUM CHLORIDE 0.9 % IV SOLN
1000.0000 mL | INTRAVENOUS | Status: DC
  Administered 2012-12-26: 1000 mL via INTRAVENOUS

## 2012-12-25 MED ORDER — SODIUM CHLORIDE 0.9 % IV SOLN
1000.0000 mL | Freq: Once | INTRAVENOUS | Status: AC
  Administered 2012-12-25: 1000 mL via INTRAVENOUS

## 2012-12-25 MED ORDER — CEPHALEXIN 500 MG PO CAPS: 500.0000 mg | ORAL_CAPSULE | Freq: Four times a day (QID) | ORAL | Status: DC

## 2012-12-25 NOTE — ED Notes (Signed)
Pt.came in with complaint of fever of 102.72F , stated that she was seen by her Primary MD and was diagnosed of UTI  Two days ago and on antibiotic.

## 2012-12-25 NOTE — ED Provider Notes (Addendum)
History     CSN: [id number]  Arrival date & time 12/25/12  2043   First MD Initiated Contact with Patient 12/25/12 2052      Chief Complaint  Patient presents with  . Fever  . Urinary Tract Infection    (Consider location/radiation/quality/duration/timing/severity/associated sxs/prior treatment) HPI 32 y.o. Female with history of ms symptoms of numbness who presents today with 6 days history of uti symptoms.  Patient started macrobid on Sunday evening and Monday.  Tuesday saw np at [facility] and changed to cipro and given im rocephin- urine culture done.  Told today that culture was negative.  Second dose of rocephin today in dr. Damian Leavell office and continues cipro now changed to seven day course.  This evening felt warm and had temp at home to 100.2, nausea, lightheaded, headache.  Feels like lymph nodes tender and swollen in groin.  No rashes, mild cough, taking po well, patient works as Psychologist, sport and exercise at [employer].  Past Medical History  Diagnosis Date  . SVT (supraventricular tachycardia)   . MS (multiple sclerosis)     Past Surgical History  Procedure Laterality Date  . Atrial ablation surgery  2003    FAILED    No family history on file.  History  Substance Use Topics  . Smoking status: Never Smoker   . Smokeless tobacco: Never Used  . Alcohol Use: No     Comment: occasional    OB History   Grav Para Term Preterm Abortions TAB SAB Ect Mult Living                  Review of Systems  All other systems reviewed and are negative.    Allergies  Review of patient's allergies indicates no known allergies.  Home Medications   Current Outpatient Rx  Name  Route  Sig  Dispense  Refill  . aspirin-acetaminophen-caffeine (EXCEDRIN MIGRAINE) 250-250-65 MG per tablet   Oral   Take 1 tablet by mouth every 6 (six) hours as needed (headache).         . diltiazem (CARDIZEM) 30 MG tablet   Oral   Take 1 tablet (30 mg total) by mouth 4 (four) times daily as needed.        Marland Kitchen glatiramer (COPAXONE) 20 MG/ML injection   Subcutaneous   Inject 20 mg into the skin daily. LAST INJECTION X 2WKS AGO          . methylphenidate (RITALIN) 10 MG tablet   Oral   Take 10 mg by mouth 2 (two) times daily.           . nitrofurantoin, macrocrystal-monohydrate, (MACROBID) 100 MG capsule   Oral   Take 1 capsule (100 mg total) by mouth 2 (two) times daily.         . norgestimate-ethinyl estradiol (SPRINTEC 28) 0.25-35 MG-MCG tablet   Oral   Take 1 tablet by mouth daily.         . pindolol (VISKEN) 10 MG tablet   Oral   Take 1 tablet (10 mg total) by mouth 2 (two) times daily.           BP 102/69  Pulse 125  Temp(Src) 100.6 F (38.1 C) (Oral)  Resp 16  Wt 130 lb (58.968 kg)  BMI 19.19 kg/m2  SpO2 97%  LMP 12/01/2012  Physical Exam  Nursing note and vitals reviewed. Constitutional: She is oriented to person, place, and time. She appears well-developed and well-nourished.  HENT:  Head: Normocephalic and  atraumatic.  Eyes: Conjunctivae and EOM are normal. Pupils are equal, round, and reactive to light.  Neck: Normal range of motion. Neck supple.  Cardiovascular: Normal rate, regular rhythm, normal heart sounds and intact distal pulses.   Pulmonary/Chest: Effort normal and breath sounds normal.  Abdominal: Soft.  Musculoskeletal: Normal range of motion.  Neurological: She is alert and oriented to person, place, and time. She has normal reflexes.  Skin: Skin is warm and dry.  Psychiatric: She has a normal mood and affect. Her behavior is normal.    ED Course  Procedures (including critical care time)  Labs Reviewed  CBC WITH DIFFERENTIAL  BASIC METABOLIC PANEL  PREGNANCY, URINE  URINALYSIS, ROUTINE W REFLEX MICROSCOPIC   No results found.   No diagnosis found.  Results for orders placed during the hospital encounter of 12/25/12  CBC WITH DIFFERENTIAL      Result Value Range   WBC 5.8  4.0 - 10.5 K/uL   RBC 4.24  3.87 - 5.11  MIL/uL   Hemoglobin 13.4  12.0 - 15.0 g/dL   HCT 91.4  78.2 - 95.6 %   MCV 90.8  78.0 - 100.0 fL   MCH 31.6  26.0 - 34.0 pg   MCHC 34.8  30.0 - 36.0 g/dL   RDW 21.3  08.6 - 57.8 %   Platelets 138 (*) 150 - 400 K/uL   Neutrophils Relative 75  43 - 77 %   Neutro Abs 4.3  1.7 - 7.7 K/uL   Lymphocytes Relative 13  12 - 46 %   Lymphs Abs 0.7  0.7 - 4.0 K/uL   Monocytes Relative 12  3 - 12 %   Monocytes Absolute 0.7  0.1 - 1.0 K/uL   Eosinophils Relative 0  0 - 5 %   Eosinophils Absolute 0.0  0.0 - 0.7 K/uL   Basophils Relative 0  0 - 1 %   Basophils Absolute 0.0  0.0 - 0.1 K/uL  BASIC METABOLIC PANEL      Result Value Range   Sodium 133 (*) 135 - 145 mEq/L   Potassium 3.4 (*) 3.5 - 5.1 mEq/L   Chloride 98  96 - 112 mEq/L   CO2 22  19 - 32 mEq/L   Glucose, Bld 91  70 - 99 mg/dL   BUN 6  6 - 23 mg/dL   Creatinine, Ser 4.69  0.50 - 1.10 mg/dL   Calcium 8.8  8.4 - 62.9 mg/dL   GFR calc non Af Amer >90  >90 mL/min   GFR calc Af Amer >90  >90 mL/min  PREGNANCY, URINE      Result Value Range   Preg Test, Ur NEGATIVE  NEGATIVE  URINALYSIS, ROUTINE W REFLEX MICROSCOPIC      Result Value Range   Color, Urine AMBER (*) YELLOW   APPearance CLOUDY (*) CLEAR   Specific Gravity, Urine 1.019  1.005 - 1.030   pH 5.5  5.0 - 8.0   Glucose, UA NEGATIVE  NEGATIVE mg/dL   Hgb urine dipstick SMALL (*) NEGATIVE   Bilirubin Urine NEGATIVE  NEGATIVE   Ketones, ur NEGATIVE  NEGATIVE mg/dL   Protein, ur 30 (*) NEGATIVE mg/dL   Urobilinogen, UA 0.2  0.0 - 1.0 mg/dL   Nitrite NEGATIVE  NEGATIVE   Leukocytes, UA TRACE (*) NEGATIVE  LACTIC ACID, PLASMA      Result Value Range   Lactic Acid, Venous 0.9  0.5 - 2.2 mmol/L  URINE MICROSCOPIC-ADD ON  Result Value Range   Squamous Epithelial / LPF FEW (*) RARE   WBC, UA 7-10  <3 WBC/hpf   RBC / HPF 3-6  <3 RBC/hpf   Urine-Other MUCOUS PRESENT       MDM  Patient with urinary pain and reports negative urine culture.  Urine here with 3-6 rbc.   Fever tonight but does not appear septic.  CT done to rule out stone as cause of pain and nidus infection.    Patient given fluid and antipyretics and hr down and fever resolved.    No evidence of stone or acute abnormality on ct.  Discussed with Dr. Lynann Bologna.  Will change to keflex.   Hilario Quarry, MD 12/25/12 4098  Hilario Quarry, MD 01/07/13 [phone]

## 2012-12-26 ENCOUNTER — Encounter (HOSPITAL_COMMUNITY): Payer: Self-pay | Admitting: *Deleted

## 2012-12-26 ENCOUNTER — Emergency Department (HOSPITAL_COMMUNITY)
Admission: EM | Admit: 2012-12-26 | Discharge: 2012-12-26 | Disposition: A | Discharge status: Home / Self Care | Payer: 59 | Attending: Emergency Medicine | Admitting: Emergency Medicine

## 2012-12-26 DIAGNOSIS — Z79899 Other long term (current) drug therapy: Secondary | ICD-10-CM | POA: Insufficient documentation

## 2012-12-26 DIAGNOSIS — R319 Hematuria, unspecified: Secondary | ICD-10-CM | POA: Insufficient documentation

## 2012-12-26 DIAGNOSIS — G35 Multiple sclerosis: Secondary | ICD-10-CM | POA: Insufficient documentation

## 2012-12-26 DIAGNOSIS — R21 Rash and other nonspecific skin eruption: Secondary | ICD-10-CM | POA: Insufficient documentation

## 2012-12-26 DIAGNOSIS — R110 Nausea: Secondary | ICD-10-CM | POA: Insufficient documentation

## 2012-12-26 DIAGNOSIS — R509 Fever, unspecified: Secondary | ICD-10-CM | POA: Insufficient documentation

## 2012-12-26 DIAGNOSIS — I498 Other specified cardiac arrhythmias: Secondary | ICD-10-CM | POA: Insufficient documentation

## 2012-12-26 DIAGNOSIS — R300 Dysuria: Secondary | ICD-10-CM | POA: Insufficient documentation

## 2012-12-26 LAB — URINE MICROSCOPIC-ADD ON

## 2012-12-26 LAB — URINALYSIS, ROUTINE W REFLEX MICROSCOPIC
Bilirubin Urine: NEGATIVE
Glucose, UA: NEGATIVE mg/dL
Ketones, ur: >80 mg/dL — AB
Leukocytes, UA: NEGATIVE
Nitrite: NEGATIVE
Protein, ur: 30 mg/dL — AB
Specific Gravity, Urine: 1.019 (ref 1.005–1.030)
Urobilinogen, UA: 0.2 mg/dL (ref 0.0–1.0)
pH: 6.5 (ref 5.0–8.0)

## 2012-12-26 MED ORDER — IBUPROFEN 800 MG PO TABS
800.0000 mg | ORAL_TABLET | Freq: Once | ORAL | Status: AC
  Administered 2012-12-26: 800 mg via ORAL
  Filled 2012-12-26: qty 1

## 2012-12-26 MED ORDER — HYDROCODONE-ACETAMINOPHEN 5-325 MG PO TABS: 1.0000 | ORAL_TABLET | ORAL | Status: DC | PRN

## 2012-12-26 MED ORDER — ONDANSETRON 4 MG PO TBDP
4.0000 mg | ORAL_TABLET | Freq: Once | ORAL | Status: AC
  Administered 2012-12-26: 4 mg via ORAL
  Filled 2012-12-26: qty 1

## 2012-12-26 MED ORDER — UROGESIC-BLUE 81.6 MG PO TABS: 1.0000 | ORAL_TABLET | Freq: Four times a day (QID) | ORAL | Status: DC | PRN

## 2012-12-26 MED ORDER — ONDANSETRON 4 MG PO TBDP: 4.0000 mg | ORAL_TABLET | Freq: Three times a day (TID) | ORAL | Status: DC | PRN

## 2012-12-26 NOTE — ED Notes (Signed)
Pt c/o dysuria and hematuria since discharge on yesterday. Reports has been treated with PO antibiotics and IV antibiotics without improvement.

## 2012-12-26 NOTE — ED Provider Notes (Signed)
History     CSN: [id number]  Arrival date & time 12/26/12  1551   First MD Initiated Contact with Patient 12/26/12 1556      Chief Complaint  Patient presents with  . Dysuria  . Rash    (Consider location/radiation/quality/duration/timing/severity/associated sxs/prior treatment) HPI Comments: Pt presents to the ED for dysuria x 6 following dx of UTI.  Initially started on macrobid, culture negative.  Symptoms persisted, IM rocephin given at FU and PO abx switched to cipro which gave her a rash.  Evaluated in the ED last night for the same, CT negative for urinary calculi or complications, labs WNL, u/a small blood and trace leuks.  1g IV Rocephin given last night and started on PO Keflex.  Pt continues to endorse severe dysuria, hematuria, and now has intermittent nausea without vomiting.  Continues to have fevers at home, highest recorded 102. Works at [employer] and urology consult was placed for her but she has not been seen yet.  Denies any vaginal discharge, new sexual partners, or concern for STDs at this time.  Has taken pyridium without relief of dysuria- states this normally works for her.  Denies any sweats, chills, pelvic pain, or flank pain.  The history is provided by the patient.    Past Medical History  Diagnosis Date  . SVT (supraventricular tachycardia)   . MS (multiple sclerosis)     Past Surgical History  Procedure Laterality Date  . Atrial ablation surgery  2003    FAILED    No family history on file.  History  Substance Use Topics  . Smoking status: Never Smoker   . Smokeless tobacco: Never Used  . Alcohol Use: No     Comment: occasional    OB History   Grav Para Term Preterm Abortions TAB SAB Ect Mult Living                  Review of Systems  Genitourinary: Positive for dysuria.  All other systems reviewed and are negative.    Allergies  Review of patient's allergies indicates no known allergies.  Home Medications   Current  Outpatient Rx  Name  Route  Sig  Dispense  Refill  . cephALEXin (KEFLEX) 500 MG capsule   Oral   Take 1 capsule (500 mg total) by mouth 4 (four) times daily.   40 capsule   0   . glatiramer (COPAXONE) 20 MG/ML injection   Subcutaneous   Inject 20 mg into the skin daily. LAST INJECTION X 2WKS AGO          . HYDROcodone-acetaminophen (NORCO/VICODIN) 5-325 MG per tablet   Oral   Take 2 tablets by mouth every 6 (six) hours as needed for pain (pain).         . methylphenidate (RITALIN) 10 MG tablet   Oral   Take 10 mg by mouth 2 (two) times daily.           . metoprolol (LOPRESSOR) 50 MG tablet   Oral   Take 50 mg by mouth 2 (two) times daily.         . norgestimate-ethinyl estradiol (SPRINTEC 28) 0.25-35 MG-MCG tablet   Oral   Take 1 tablet by mouth daily.           BP 132/92  Pulse 118  Temp(Src) 101.6 F (38.7 C) (Oral)  Resp 20  SpO2 96%  LMP 12/01/2012  Physical Exam  Nursing note and vitals reviewed. Constitutional: She is oriented to  person, place, and time. She appears well-developed and well-nourished.  HENT:  Head: Normocephalic and atraumatic.  Mouth/Throat: Oropharynx is clear and moist.  Eyes: Conjunctivae and EOM are normal. Pupils are equal, round, and reactive to light.  Neck: Normal range of motion.  Cardiovascular: Normal rate, regular rhythm and normal heart sounds.   Pulmonary/Chest: Effort normal and breath sounds normal.  Abdominal: Soft. Bowel sounds are normal.  Genitourinary: There is no lesion on the right labia. There is no lesion on the left labia.  Musculoskeletal: Normal range of motion.  Neurological: She is alert and oriented to person, place, and time.  Skin: Skin is warm and dry. Rash noted. Rash is macular.  Diffuse macular rash, non-pruritic  Psychiatric: She has a normal mood and affect.    ED Course  Procedures (including critical care time)  Labs Reviewed  URINALYSIS, ROUTINE W REFLEX MICROSCOPIC - Abnormal;  Notable for the following:    APPearance CLOUDY (*)    Hgb urine dipstick MODERATE (*)    Ketones, ur >80 (*)    Protein, ur 30 (*)    All other components within normal limits  URINE MICROSCOPIC-ADD ON - Abnormal; Notable for the following:    Squamous Epithelial / LPF FEW (*)    All other components within normal limits  URINE CULTURE   Ct Abdomen Pelvis Wo Contrast  12/25/2012  *RADIOLOGY REPORT*  Clinical Data: Hematuria.  Fever.  Urinary tract infection.  Renal calculi.  CT ABDOMEN AND PELVIS WITHOUT CONTRAST  Technique:  Multidetector CT imaging of the abdomen and pelvis was performed following the standard protocol without intravenous contrast.  Comparison: None.  Findings: Lung Bases: Dependent atelectasis.  Liver:  Unenhanced CT was performed per clinician order.  Lack of IV contrast limits sensitivity and specificity, especially for evaluation of abdominal/pelvic solid viscera.  Grossly normal.  Spleen:  Normal.  Gallbladder:  Distended, likely secondary to fasting state.  Common bile duct:  5 mm, upper limits normal for age.  Pancreas:  Normal.  Adrenal glands:  Normal.  Kidneys:  No renal calculi.  The ureters appear within normal limits.  Phleboliths in the pelvis.  Distal ureters are difficult to visualize due to paucity of abdominal fat.  Stomach:  Grossly normal.  Small bowel:  Normal.  No mesenteric adenopathy identified.  Colon:   Appendix not identified.  No inflammatory changes colon. The rectum appears within normal limits.  Pelvic Genitourinary:  Normal appearance of the urinary bladder. No calculi.  A physiologic amount of free fluid.  Uterus and adnexa appear within normal limits.  There may be a small amount of fluid in the lower uterine segment.  Bones:  No aggressive osseous lesions.  Vasculature: Normal.  IMPRESSION: No acute abnormality.  Findings discussed with Dr. Rosalia Hammers at the time of interpretation.   Original Report Authenticated By: Andreas Newport, M.D.      1. Dysuria    2. Hematuria       MDM   31 y.o.F presenting to the ED for recurrent dysuria. Patient with complete workup last night including CT scan which was negative for urinary calculi. Labs largely within normal limits, no leukocytosis.   UA without definitive infection, small blood.  1g IV rocephin given and prescribed keflex.  Mildly febrile on arrival, 101.6.  UA repeated today, moderate blood. No definitive infection, culture pending.  Fever resolved with PO Motrin. Instructed to continue Keflex, followup with [facility]- most likely will need cystoscopy as a source for her  dysuria and hematuria has not been identified. Rx urogesic blue, vicodin, and zofran.  Discussed plan with pt- she agreed.  Return precautions advised.       Garlon Hatchet, PA-C 12/27/12 0106

## 2012-12-28 LAB — URINE CULTURE
Colony Count: NO GROWTH
Culture: NO GROWTH

## 2012-12-29 NOTE — ED Provider Notes (Signed)
Medical screening examination/treatment/procedure(s) were performed by non-physician practitioner and as supervising physician I was immediately available for consultation/collaboration.   Lyanne Co, MD 12/29/12 0001

## 2013-01-01 LAB — CULTURE, BLOOD (ROUTINE X 2)
Culture: NO GROWTH
Culture: NO GROWTH

## 2013-02-16 ENCOUNTER — Emergency Department (HOSPITAL_COMMUNITY)
Admission: EM | Admit: 2013-02-16 | Discharge: 2013-02-16 | Disposition: A | Discharge status: Home / Self Care | Payer: 59 | Source: Home / Self Care | Attending: Emergency Medicine | Admitting: Emergency Medicine

## 2013-02-16 ENCOUNTER — Encounter (HOSPITAL_COMMUNITY): Payer: Self-pay | Admitting: Emergency Medicine

## 2013-02-16 DIAGNOSIS — L98491 Non-pressure chronic ulcer of skin of other sites limited to breakdown of skin: Secondary | ICD-10-CM

## 2013-02-16 DIAGNOSIS — L98499 Non-pressure chronic ulcer of skin of other sites with unspecified severity: Secondary | ICD-10-CM

## 2013-02-16 MED ORDER — FLUCONAZOLE 150 MG PO TABS: 150.0000 mg | ORAL_TABLET | Freq: Once | ORAL | Status: DC

## 2013-02-16 MED ORDER — VALACYCLOVIR HCL 1 G PO TABS: 1000.0000 mg | ORAL_TABLET | Freq: Two times a day (BID) | ORAL | Status: DC

## 2013-02-16 MED ORDER — MUPIROCIN 2 % EX OINT: TOPICAL_OINTMENT | Freq: Three times a day (TID) | CUTANEOUS | Status: DC

## 2013-02-16 MED ORDER — CEPHALEXIN 500 MG PO CAPS: 500.0000 mg | ORAL_CAPSULE | Freq: Three times a day (TID) | ORAL | Status: DC

## 2013-02-16 NOTE — ED Notes (Signed)
Blisters under left arm.  History of the same. Patient believes this to be shingles.  Patient reports blisters 2-3 days.  Patient reports the blisters filled with fluid to the point of looking like one large blister.  Reports blister popped and has been draining for one day.  Patient had tegaderm on site, removed .

## 2013-02-16 NOTE — ED Provider Notes (Signed)
Chief Complaint:   Chief Complaint  Patient presents with  . Blister    History of Present Illness:   Karen Powers is a 32 year old female with a three-day history of a painful blister in her left axilla. She had a similar blister there a month ago and 3 years ago. She was told she might have shingles but no cultures were obtained. She took a course of Valtrex and this got better and dried up. She denies any fever or chills. Has been draining a little bit of yellowish drainage. She denies any other skin lesions.  Review of Systems:  Other than noted above, the patient denies any of the following symptoms: Systemic:  No fever, chills, sweats, weight loss, or fatigue. ENT:  No nasal congestion, rhinorrhea, sore throat, swelling of lips, tongue or throat. Resp:  No cough, wheezing, or shortness of breath. Skin:  No rash, itching, nodules, or suspicious lesions.  PMFSH:  Past medical history, family history, social history, meds, and allergies were reviewed. She has a history of SVT and multiple sclerosis. She takes Ritalin, Copaxone, and Sprintec.  Physical Exam:   Vital signs:  BP 126/70  Pulse 84  Temp(Src) 98.1 F (36.7 C) (Oral)  Resp 16  SpO2 100%  LMP 01/24/2013 Gen:  Alert, oriented, in no distress. ENT:  Pharynx clear, no intraoral lesions, moist mucous membranes. Lungs:  Clear to auscultation. Skin:  There is a 1 cm ulcerated lesion in the left axilla. This is mildly tender to palpation. There is no surrounding erythema or induration. Skin is otherwise clear.  Course in Urgent Care Center:   Routine cultures were obtained for this as well as viral cultures for herpes simplex and herpes zoster.  Assessment:  The encounter diagnosis was Skin ulcer, limited to breakdown of skin.  This does not appear to be shingles. It could be herpes simplex since it has been a recurring issue or could be a nonspecific skin ulceration. Cultures are pending, and we'll go ahead and treat with  Valtrex and cephalexin both.  Plan:   1.  The following meds were prescribed:   Discharge Medication List as of 02/16/2013 10:46 AM    START taking these medications   Details  !! cephALEXin (KEFLEX) 500 MG capsule Take 1 capsule (500 mg total) by mouth 3 (three) times daily., Starting 02/16/2013, Until Discontinued, Normal    fluconazole (DIFLUCAN) 150 MG tablet Take 1 tablet (150 mg total) by mouth once., Starting 02/16/2013, Normal    mupirocin ointment (BACTROBAN) 2 % Apply topically 3 (three) times daily., Starting 02/16/2013, Until Discontinued, Normal    valACYclovir (VALTREX) 1000 MG tablet Take 1 tablet (1,000 mg total) by mouth 2 (two) times daily., Starting 02/16/2013, Until Discontinued, Normal     !! - Potential duplicate medications found. Please discuss with provider.     2.  The patient was instructed in symptomatic care and handouts were given. 3.  The patient was told to return if becoming worse in any way, if no better in 3 or 4 days, and given some red flag symptoms such as fever or worsening pain that would indicate earlier return. 4.  Follow up here as necessary.     Reuben Likes, MD 02/16/13 2123

## 2013-02-18 LAB — HERPES SIMPLEX VIRUS CULTURE

## 2013-02-18 LAB — WOUND CULTURE

## 2013-02-18 LAB — VARICELLA-ZOSTER BY PCR: Varicella-Zoster, PCR: NOT DETECTED

## 2013-02-18 NOTE — ED Notes (Signed)
Herpes culture: Herpes detected, insufficient antigen to determine type, Varicella Zoster neg., Wound culture L axilla: Mod. Staph. Aureus.  Pt. treated with Keflex and Mupirocin oint.  Message sent to Dr. Lorenz Coaster. Vassie Moselle 02/18/2013

## 2013-02-19 ENCOUNTER — Telehealth (HOSPITAL_COMMUNITY): Payer: Self-pay | Admitting: Emergency Medicine

## 2013-02-19 LAB — MISCELLANEOUS TEST

## 2013-02-19 MED ORDER — VALACYCLOVIR HCL 1 G PO TABS: ORAL_TABLET | ORAL | Status: DC

## 2013-02-19 NOTE — Telephone Encounter (Signed)
Message copied by Reuben Likes on Thu Feb 19, 2013  4:07 PM ------      Message from: Vassie Moselle      Created: Wed Feb 18, 2013 10:17 PM      Regarding: labs       Herpes detected but insufficient antigen to determine type.  Varicella Zoster was neg.  Do you want me to notify the pt.?      Vassie Moselle      02/18/2013       ------

## 2013-02-19 NOTE — ED Notes (Signed)
The patient's cultures were positive for herpes simplex, although there was insufficient material for determining the type and also for methicillin susceptible Staphylococcus aureus. The patient was called and informed of these results. She states she's doing better. Prescription was called in for Valtrex 1000 mg #7 one half tablet twice a day for future outbreaks with 12 refills.  Reuben Likes, MD 02/19/13 [phone]

## 2013-05-21 LAB — OB RESULTS CONSOLE HEPATITIS B SURFACE ANTIGEN: Hepatitis B Surface Ag: NEGATIVE

## 2013-05-21 LAB — OB RESULTS CONSOLE ANTIBODY SCREEN: Antibody Screen: NEGATIVE

## 2013-05-21 LAB — OB RESULTS CONSOLE RPR: RPR: NONREACTIVE

## 2013-05-21 LAB — OB RESULTS CONSOLE RUBELLA ANTIBODY, IGM: Rubella: IMMUNE

## 2013-05-21 LAB — OB RESULTS CONSOLE ABO/RH: RH Type: NEGATIVE

## 2013-05-21 LAB — OB RESULTS CONSOLE HIV ANTIBODY (ROUTINE TESTING): HIV: NONREACTIVE

## 2013-08-27 DIAGNOSIS — E119 Type 2 diabetes mellitus without complications: Secondary | ICD-10-CM

## 2013-08-27 HISTORY — DX: Type 2 diabetes mellitus without complications: E11.9

## 2013-10-07 ENCOUNTER — Encounter: Payer: 59 | Attending: Obstetrics and Gynecology

## 2013-10-07 VITALS — Ht 69.5 in | Wt 161.7 lb

## 2013-10-07 DIAGNOSIS — O99810 Abnormal glucose complicating pregnancy: Secondary | ICD-10-CM | POA: Insufficient documentation

## 2013-10-08 NOTE — Progress Notes (Signed)
  Patient was seen on 10/07/13 for Gestational Diabetes self-management class at the [facility]. The following learning objectives were met by the patient during this course:   States the definition of Gestational Diabetes  States why dietary management is important in controlling blood glucose  Describes the effects of carbohydrates on blood glucose levels  Demonstrates ability to create a balanced meal plan  Demonstrates carbohydrate counting   States when to check blood glucose levels  Demonstrates proper blood glucose monitoring techniques  States the effect of stress and exercise on blood glucose levels  States the importance of limiting caffeine and abstaining from alcohol and smoking  Plan:  Aim for 2 Carb Choices per meal (30 grams) +/- 1 either way for breakfast Aim for 3 Carb Choices per meal (45 grams) +/- 1 either way from lunch and dinner Aim for 1-2 Carbs per snack Begin reading food labels for Total Carbohydrate and sugar grams of foods Consider  increasing your activity level by walking daily as tolerated Begin checking BG before breakfast and 1-2 hours after first bit of breakfast, lunch and dinner after  as directed by MD  Take medication  as directed by MD  Blood glucose monitor given: Accu Chek Nano BG Monitoring Kit Lot # [id number] Exp: 09-26-14 Blood glucose reading: 95  Patient instructed to monitor glucose levels: FBS: 60 - <90 2 hour: <120  Patient received the following handouts:  Nutrition Diabetes and Pregnancy  Carbohydrate Counting List  Meal Planning worksheet  Patient will be seen for follow-up as needed.

## 2013-11-14 ENCOUNTER — Inpatient Hospital Stay (HOSPITAL_COMMUNITY)
Admission: AD | Admit: 2013-11-14 | Discharge: 2013-11-14 | Disposition: A | Discharge status: Home / Self Care | Payer: 59 | Source: Ambulatory Visit | Attending: Obstetrics | Admitting: Obstetrics

## 2013-11-14 ENCOUNTER — Encounter (HOSPITAL_COMMUNITY): Payer: Self-pay | Admitting: Family

## 2013-11-14 DIAGNOSIS — O4700 False labor before 37 completed weeks of gestation, unspecified trimester: Secondary | ICD-10-CM | POA: Insufficient documentation

## 2013-11-14 DIAGNOSIS — O34219 Maternal care for unspecified type scar from previous cesarean delivery: Secondary | ICD-10-CM | POA: Insufficient documentation

## 2013-11-14 NOTE — MAU Note (Addendum)
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 Denies LOF, VB. Reports +FM.

## 2013-11-14 NOTE — Progress Notes (Signed)
33 yo G3P1011 at 34'2 presents with contractions through the night. Pt works night shift at [employer], on feet all night, less water intake and some increased contractions. No VB, no LOF, good FM.   PSH: c/s x 1 for breech  PE: Filed Vitals:   11/14/13 0808  BP: 125/85  Pulse: 103  Temp: 98.3 F (36.8 C)  Resp: 15   Gen: well, no distress, sleeping Abd: NT, gravid, LE: NT, no edema Gu: cvx softening, 10% effaced, post, closed, breech by [place]  Toco: irritibility FH: 130's, + accels, no decels, 10 beat var  A/P: Not in labor, planning RCS, Recc increased water intake for uterine irritibility. Reactive fetal status.  Rosaland Shiffman A. 11/14/2013 8:52 AM

## 2013-11-20 ENCOUNTER — Other Ambulatory Visit: Payer: Self-pay | Admitting: Obstetrics and Gynecology

## 2013-12-07 ENCOUNTER — Encounter (HOSPITAL_COMMUNITY): Payer: Self-pay | Admitting: Pharmacist

## 2013-12-15 ENCOUNTER — Encounter (HOSPITAL_COMMUNITY): Payer: Self-pay

## 2013-12-16 ENCOUNTER — Encounter (HOSPITAL_COMMUNITY): Payer: Self-pay

## 2013-12-17 ENCOUNTER — Encounter (HOSPITAL_COMMUNITY): Payer: Self-pay

## 2013-12-17 ENCOUNTER — Encounter (HOSPITAL_COMMUNITY)
Admission: RE | Admit: 2013-12-17 | Discharge: 2013-12-17 | Disposition: A | Discharge status: Home / Self Care | Payer: 59 | Source: Ambulatory Visit | Attending: Obstetrics and Gynecology | Admitting: Obstetrics and Gynecology

## 2013-12-17 DIAGNOSIS — Z01812 Encounter for preprocedural laboratory examination: Secondary | ICD-10-CM

## 2013-12-17 HISTORY — DX: Headache: R51

## 2013-12-17 HISTORY — DX: Heartburn: R12

## 2013-12-17 HISTORY — DX: Other specified pregnancy related conditions, unspecified trimester: O26.899

## 2013-12-17 LAB — CBC
HCT: 35 % — ABNORMAL LOW (ref 36.0–46.0)
Hemoglobin: 11.4 g/dL — ABNORMAL LOW (ref 12.0–15.0)
MCH: 32.1 pg (ref 26.0–34.0)
MCHC: 32.6 g/dL (ref 30.0–36.0)
MCV: 98.6 fL (ref 78.0–100.0)
Platelets: 170 10*3/uL (ref 150–400)
RBC: 3.55 MIL/uL — ABNORMAL LOW (ref 3.87–5.11)
RDW: 12.7 % (ref 11.5–15.5)
WBC: 10.2 10*3/uL (ref 4.0–10.5)

## 2013-12-17 LAB — BASIC METABOLIC PANEL
BUN: 6 mg/dL (ref 6–23)
CO2: 23 mEq/L (ref 19–32)
Calcium: 9.5 mg/dL (ref 8.4–10.5)
Chloride: 101 mEq/L (ref 96–112)
Creatinine, Ser: 0.54 mg/dL (ref 0.50–1.10)
GFR calc Af Amer: >90 mL/min (ref >90)
GFR calc non Af Amer: >90 mL/min (ref >90)
Glucose, Bld: 111 mg/dL — ABNORMAL HIGH (ref 70–99)
Potassium: 3.7 mEq/L (ref 3.7–5.3)
Sodium: 138 mEq/L (ref 137–147)

## 2013-12-17 LAB — ABO/RH: ABO/RH(D): O NEG

## 2013-12-17 LAB — RPR

## 2013-12-17 NOTE — Patient Instructions (Addendum)
   Your procedure is scheduled on:  Saturday, April 25  Enter through the [employer] [facility] at: 9 AM Pick up the phone at the desk and dial [phone] and inform us of your arrival.  Please call this number if you have any problems the morning of surgery: [phone]  Remember: Do not eat or drink after midnight: Friday Take these medicines the morning of surgery with a SIP OF WATER:  None  Do not wear jewelry, make-up, or FINGER nail polish No metal in your hair or on your body. Do not wear lotions, powders, perfumes.  You may wear deodorant.  Do not bring valuables to the hospital. Contacts, dentures or bridgework may not be worn into surgery.  Leave suitcase in the car. After Surgery it may be brought to your room. For patients being admitted to the hospital, checkout time is 11:00am the day of discharge.  Home with husband Karen Powers  cell [phone].

## 2013-12-18 ENCOUNTER — Other Ambulatory Visit (HOSPITAL_COMMUNITY): Payer: 59

## 2013-12-18 NOTE — H&P (Signed)
Karen Powers, Karen Powers NO.:  [id number]  MEDICAL RECORD NO.:  [id number]  LOCATION:  PERIO                         FACILITY:  [facility]  PHYSICIAN:  Lovenia Kim, M.D.DATE OF BIRTH:  1981/07/02  DATE OF ADMISSION:  11/14/2013 DATE OF DISCHARGE:                             HISTORY & PHYSICAL   CHIEF COMPLAINT:  Previous C-section with oligohydramnios and breech presentation for repeat C-section at 73 weeks.  HISTORY OF PRESENT ILLNESS:  A 33 year old white female, G2, P1, at [redacted] weeks gestation for repeat C-section as noted  MEDICATIONS:  Zofran and prenatal vitamins.  SOCIAL HISTORY:  She is a nonsmoker, nondrinker.  She denies domestic or physical violence.  FAMILY HISTORY:  Cancer, diabetes, and coronary artery disease.  PRENATAL COURSE:  Complicated by low amniotic fluid and breech presentation.  PHYSICAL EXAMINATION:  GENERAL:  A well-developed, well-nourished white female, in no acute distress. HEENT:  Normal. NECK:  Supple.  Full range of motion. LUNGS:  Clear. HEART:  Regular rate and rhythm. ABDOMEN:  Soft, gravid, nontender.  No CVA tenderness.  Neuro: nonfocal  IMPRESSION: 1. A 39-week intrauterine pregnancy. 2. Persistent breech presentation. 3. Oligohydramnios.  PLAN:  Proceed with low transverse repeat cesarean section. Risks of anesthesia, infection, bleeding, injury to surrounding organs with possible need for repair were discussed.  Delayed versus immediate complications to include bowel and bladder injury were noted.  The patient acknowledges and wishes to proceed. Consentent    Lovenia Kim, M.D.    RJT/MEDQ  D:  12/18/2013  T:  12/18/2013  Job:  [id number]

## 2013-12-19 ENCOUNTER — Encounter (HOSPITAL_COMMUNITY)
Admission: RE | Disposition: A | Discharge status: Home / Self Care | Payer: Self-pay | Source: Ambulatory Visit | Attending: Obstetrics and Gynecology

## 2013-12-19 ENCOUNTER — Encounter (HOSPITAL_COMMUNITY): Payer: 59 | Admitting: Anesthesiology

## 2013-12-19 ENCOUNTER — Encounter (HOSPITAL_COMMUNITY): Payer: Self-pay | Admitting: *Deleted

## 2013-12-19 ENCOUNTER — Inpatient Hospital Stay (HOSPITAL_COMMUNITY)
Admission: RE | Admit: 2013-12-19 | Discharge: 2013-12-22 | DRG: 765 | Disposition: A | Discharge status: Home / Self Care | Payer: 59 | Source: Ambulatory Visit | Attending: Obstetrics and Gynecology | Admitting: Obstetrics and Gynecology

## 2013-12-19 ENCOUNTER — Inpatient Hospital Stay (HOSPITAL_COMMUNITY): Payer: 59 | Admitting: Anesthesiology

## 2013-12-19 DIAGNOSIS — I472 Ventricular tachycardia, unspecified: Secondary | ICD-10-CM | POA: Diagnosis present

## 2013-12-19 DIAGNOSIS — Z98891 History of uterine scar from previous surgery: Secondary | ICD-10-CM

## 2013-12-19 DIAGNOSIS — O34219 Maternal care for unspecified type scar from previous cesarean delivery: Principal | ICD-10-CM | POA: Diagnosis present

## 2013-12-19 DIAGNOSIS — O321XX0 Maternal care for breech presentation, not applicable or unspecified: Secondary | ICD-10-CM | POA: Diagnosis present

## 2013-12-19 DIAGNOSIS — I4729 Other ventricular tachycardia: Secondary | ICD-10-CM | POA: Diagnosis present

## 2013-12-19 DIAGNOSIS — O4100X0 Oligohydramnios, unspecified trimester, not applicable or unspecified: Secondary | ICD-10-CM | POA: Diagnosis present

## 2013-12-19 DIAGNOSIS — O99814 Abnormal glucose complicating childbirth: Secondary | ICD-10-CM | POA: Diagnosis present

## 2013-12-19 DIAGNOSIS — G35 Multiple sclerosis: Secondary | ICD-10-CM | POA: Diagnosis present

## 2013-12-19 DIAGNOSIS — O9903 Anemia complicating the puerperium: Secondary | ICD-10-CM | POA: Diagnosis not present

## 2013-12-19 DIAGNOSIS — D62 Acute posthemorrhagic anemia: Secondary | ICD-10-CM | POA: Diagnosis not present

## 2013-12-19 LAB — GLUCOSE, CAPILLARY: Glucose-Capillary: 69 mg/dL — ABNORMAL LOW (ref 70–99)

## 2013-12-19 LAB — PREPARE RBC (CROSSMATCH)

## 2013-12-19 SURGERY — Surgical Case
Anesthesia: Spinal

## 2013-12-19 MED ORDER — KETOROLAC TROMETHAMINE 30 MG/ML IJ SOLN: 30.0000 mg | Freq: Four times a day (QID) | INTRAMUSCULAR | Status: DC | PRN

## 2013-12-19 MED ORDER — SCOPOLAMINE 1 MG/3DAYS TD PT72
1.0000 | MEDICATED_PATCH | Freq: Once | TRANSDERMAL | Status: DC
  Filled 2013-12-19: qty 1

## 2013-12-19 MED ORDER — SIMETHICONE 80 MG PO CHEW
80.0000 mg | CHEWABLE_TABLET | ORAL | Status: DC
  Administered 2013-12-20 – 2013-12-21 (×3): 80 mg via ORAL
  Filled 2013-12-19 (×3): qty 1

## 2013-12-19 MED ORDER — NALBUPHINE HCL 10 MG/ML IJ SOLN
5.0000 mg | INTRAMUSCULAR | Status: DC | PRN
  Administered 2013-12-19: 10 mg via SUBCUTANEOUS
  Filled 2013-12-19: qty 1

## 2013-12-19 MED ORDER — NALOXONE HCL 0.4 MG/ML IJ SOLN: 0.4000 mg | INTRAMUSCULAR | Status: DC | PRN

## 2013-12-19 MED ORDER — MORPHINE SULFATE 0.5 MG/ML IJ SOLN
INTRAMUSCULAR | Status: AC
  Filled 2013-12-19: qty 10

## 2013-12-19 MED ORDER — LACTATED RINGERS IV SOLN
INTRAVENOUS | Status: DC | PRN
  Administered 2013-12-19: 12:00:00 via INTRAVENOUS

## 2013-12-19 MED ORDER — SIMETHICONE 80 MG PO CHEW: 80.0000 mg | CHEWABLE_TABLET | ORAL | Status: DC | PRN

## 2013-12-19 MED ORDER — CEFAZOLIN SODIUM-DEXTROSE 2-3 GM-% IV SOLR
INTRAVENOUS | Status: DC | PRN
  Administered 2013-12-19: 2 g via INTRAVENOUS

## 2013-12-19 MED ORDER — ONDANSETRON HCL 4 MG/2ML IJ SOLN
INTRAMUSCULAR | Status: AC
  Filled 2013-12-19: qty 2

## 2013-12-19 MED ORDER — SODIUM CHLORIDE 0.9 % IJ SOLN: 3.0000 mL | INTRAMUSCULAR | Status: DC | PRN

## 2013-12-19 MED ORDER — DIPHENHYDRAMINE HCL 25 MG PO CAPS: 25.0000 mg | ORAL_CAPSULE | Freq: Four times a day (QID) | ORAL | Status: DC | PRN

## 2013-12-19 MED ORDER — MORPHINE SULFATE (PF) 0.5 MG/ML IJ SOLN
INTRAMUSCULAR | Status: DC | PRN
  Administered 2013-12-19: .1 mg via INTRATHECAL

## 2013-12-19 MED ORDER — BUPIVACAINE IN DEXTROSE 0.75-8.25 % IT SOLN
INTRATHECAL | Status: DC | PRN
  Administered 2013-12-19: 1.5 mL via INTRATHECAL

## 2013-12-19 MED ORDER — DIPHENHYDRAMINE HCL 50 MG/ML IJ SOLN: 25.0000 mg | INTRAMUSCULAR | Status: DC | PRN

## 2013-12-19 MED ORDER — OXYCODONE-ACETAMINOPHEN 5-325 MG PO TABS
1.0000 | ORAL_TABLET | ORAL | Status: DC | PRN
  Administered 2013-12-20 (×4): 1 via ORAL
  Administered 2013-12-20 – 2013-12-22 (×8): 2 via ORAL
  Filled 2013-12-19 (×3): qty 1
  Filled 2013-12-19 (×5): qty 2
  Filled 2013-12-19: qty 1
  Filled 2013-12-19 (×4): qty 2

## 2013-12-19 MED ORDER — WITCH HAZEL-GLYCERIN EX PADS: 1.0000 "application " | MEDICATED_PAD | CUTANEOUS | Status: DC | PRN

## 2013-12-19 MED ORDER — LACTATED RINGERS IV SOLN
INTRAVENOUS | Status: DC
  Administered 2013-12-19: 11:00:00 via INTRAVENOUS
  Administered 2013-12-19: 100 mL/h via INTRAVENOUS
  Administered 2013-12-19: 11:00:00 via INTRAVENOUS
  Administered 2013-12-19: 100 mL/h via INTRAVENOUS

## 2013-12-19 MED ORDER — LANOLIN HYDROUS EX OINT: 1.0000 "application " | TOPICAL_OINTMENT | CUTANEOUS | Status: DC | PRN

## 2013-12-19 MED ORDER — NALOXONE HCL 1 MG/ML IJ SOLN: 1.0000 ug/kg/h | INTRAVENOUS | Status: DC | PRN

## 2013-12-19 MED ORDER — SCOPOLAMINE 1 MG/3DAYS TD PT72
1.0000 | MEDICATED_PATCH | Freq: Once | TRANSDERMAL | Status: DC
  Administered 2013-12-19: 1.5 mg via TRANSDERMAL

## 2013-12-19 MED ORDER — SENNOSIDES-DOCUSATE SODIUM 8.6-50 MG PO TABS
2.0000 | ORAL_TABLET | ORAL | Status: DC
  Administered 2013-12-20 – 2013-12-21 (×3): 2 via ORAL
  Filled 2013-12-19 (×3): qty 2

## 2013-12-19 MED ORDER — OXYTOCIN 10 UNIT/ML IJ SOLN
INTRAMUSCULAR | Status: AC
  Filled 2013-12-19: qty 4

## 2013-12-19 MED ORDER — METOCLOPRAMIDE HCL 5 MG/ML IJ SOLN: 10.0000 mg | Freq: Three times a day (TID) | INTRAMUSCULAR | Status: DC | PRN

## 2013-12-19 MED ORDER — OXYTOCIN 40 UNITS IN LACTATED RINGERS INFUSION - SIMPLE MED: 62.5000 mL/h | INTRAVENOUS | Status: AC

## 2013-12-19 MED ORDER — KETOROLAC TROMETHAMINE 60 MG/2ML IM SOLN
60.0000 mg | Freq: Once | INTRAMUSCULAR | Status: AC | PRN
  Administered 2013-12-19: 60 mg via INTRAMUSCULAR

## 2013-12-19 MED ORDER — PRENATAL MULTIVITAMIN CH
1.0000 | ORAL_TABLET | Freq: Every day | ORAL | Status: DC
  Administered 2013-12-20 – 2013-12-22 (×3): 1 via ORAL
  Filled 2013-12-19 (×3): qty 1

## 2013-12-19 MED ORDER — CEFAZOLIN SODIUM-DEXTROSE 2-3 GM-% IV SOLR: INTRAVENOUS | Status: DC | PRN

## 2013-12-19 MED ORDER — MENTHOL 3 MG MT LOZG: 1.0000 | LOZENGE | OROMUCOSAL | Status: DC | PRN

## 2013-12-19 MED ORDER — PHENYLEPHRINE HCL 10 MG/ML IJ SOLN
INTRAMUSCULAR | Status: AC
  Filled 2013-12-19: qty 1

## 2013-12-19 MED ORDER — FENTANYL CITRATE 0.05 MG/ML IJ SOLN
INTRAMUSCULAR | Status: DC | PRN
  Administered 2013-12-19: 12.5 ug via INTRATHECAL

## 2013-12-19 MED ORDER — KETOROLAC TROMETHAMINE 30 MG/ML IJ SOLN
30.0000 mg | Freq: Four times a day (QID) | INTRAMUSCULAR | Status: DC | PRN
  Filled 2013-12-19: qty 1

## 2013-12-19 MED ORDER — IBUPROFEN 600 MG PO TABS
600.0000 mg | ORAL_TABLET | Freq: Four times a day (QID) | ORAL | Status: DC
  Administered 2013-12-19 – 2013-12-22 (×11): 600 mg via ORAL
  Filled 2013-12-19 (×11): qty 1

## 2013-12-19 MED ORDER — ONDANSETRON HCL 4 MG PO TABS: 4.0000 mg | ORAL_TABLET | ORAL | Status: DC | PRN

## 2013-12-19 MED ORDER — DIPHENHYDRAMINE HCL 25 MG PO CAPS: 25.0000 mg | ORAL_CAPSULE | ORAL | Status: DC | PRN

## 2013-12-19 MED ORDER — CEFAZOLIN SODIUM-DEXTROSE 2-3 GM-% IV SOLR: 2.0000 g | INTRAVENOUS | Status: DC

## 2013-12-19 MED ORDER — LACTATED RINGERS IV SOLN: INTRAVENOUS | Status: DC | PRN

## 2013-12-19 MED ORDER — METHYLERGONOVINE MALEATE 0.2 MG PO TABS: 0.2000 mg | ORAL_TABLET | ORAL | Status: DC | PRN

## 2013-12-19 MED ORDER — ONDANSETRON HCL 4 MG/2ML IJ SOLN: 4.0000 mg | Freq: Three times a day (TID) | INTRAMUSCULAR | Status: DC | PRN

## 2013-12-19 MED ORDER — ZOLPIDEM TARTRATE 5 MG PO TABS: 5.0000 mg | ORAL_TABLET | Freq: Every evening | ORAL | Status: DC | PRN

## 2013-12-19 MED ORDER — LACTATED RINGERS IV SOLN: INTRAVENOUS | Status: DC

## 2013-12-19 MED ORDER — METHYLERGONOVINE MALEATE 0.2 MG/ML IJ SOLN: 0.2000 mg | INTRAMUSCULAR | Status: DC | PRN

## 2013-12-19 MED ORDER — FENTANYL CITRATE 0.05 MG/ML IJ SOLN
INTRAMUSCULAR | Status: AC
  Filled 2013-12-19: qty 2

## 2013-12-19 MED ORDER — SIMETHICONE 80 MG PO CHEW
80.0000 mg | CHEWABLE_TABLET | Freq: Three times a day (TID) | ORAL | Status: DC
  Administered 2013-12-20 – 2013-12-22 (×7): 80 mg via ORAL
  Filled 2013-12-19 (×8): qty 1

## 2013-12-19 MED ORDER — FENTANYL CITRATE 0.05 MG/ML IJ SOLN: 25.0000 ug | INTRAMUSCULAR | Status: DC | PRN

## 2013-12-19 MED ORDER — BUPIVACAINE HCL (PF) 0.25 % IJ SOLN
INTRAMUSCULAR | Status: AC
  Filled 2013-12-19: qty 30

## 2013-12-19 MED ORDER — MEPERIDINE HCL 25 MG/ML IJ SOLN: 6.2500 mg | INTRAMUSCULAR | Status: DC | PRN

## 2013-12-19 MED ORDER — TETANUS-DIPHTH-ACELL PERTUSSIS 5-2.5-18.5 LF-MCG/0.5 IM SUSP: 0.5000 mL | Freq: Once | INTRAMUSCULAR | Status: DC

## 2013-12-19 MED ORDER — PHENYLEPHRINE 8 MG IN D5W 100 ML (0.08MG/ML) PREMIX OPTIME
INJECTION | INTRAVENOUS | Status: DC | PRN
  Administered 2013-12-19: 60 ug/min via INTRAVENOUS

## 2013-12-19 MED ORDER — DIBUCAINE 1 % RE OINT: 1.0000 "application " | TOPICAL_OINTMENT | RECTAL | Status: DC | PRN

## 2013-12-19 MED ORDER — OXYTOCIN 10 UNIT/ML IJ SOLN
40.0000 [IU] | INTRAVENOUS | Status: DC | PRN
  Administered 2013-12-19: 40 [IU] via INTRAVENOUS

## 2013-12-19 MED ORDER — NALBUPHINE HCL 10 MG/ML IJ SOLN
5.0000 mg | INTRAMUSCULAR | Status: DC | PRN
  Administered 2013-12-19: 10 mg via INTRAVENOUS
  Filled 2013-12-19 (×2): qty 1

## 2013-12-19 MED ORDER — KETOROLAC TROMETHAMINE 60 MG/2ML IM SOLN
INTRAMUSCULAR | Status: AC
Start: 2013-12-19 — End: 2013-12-19
  Administered 2013-12-19: 60 mg via INTRAMUSCULAR
  Filled 2013-12-19: qty 2

## 2013-12-19 MED ORDER — DIPHENHYDRAMINE HCL 50 MG/ML IJ SOLN
12.5000 mg | INTRAMUSCULAR | Status: DC | PRN
  Administered 2013-12-19: 12.5 mg via INTRAVENOUS
  Filled 2013-12-19: qty 1

## 2013-12-19 MED ORDER — BUPIVACAINE HCL (PF) 0.25 % IJ SOLN
INTRAMUSCULAR | Status: DC | PRN
  Administered 2013-12-19: 10 mL

## 2013-12-19 MED ORDER — ONDANSETRON HCL 4 MG/2ML IJ SOLN
INTRAMUSCULAR | Status: DC | PRN
  Administered 2013-12-19: 4 mg via INTRAVENOUS

## 2013-12-19 MED ORDER — ONDANSETRON HCL 4 MG/2ML IJ SOLN: 4.0000 mg | INTRAMUSCULAR | Status: DC | PRN

## 2013-12-19 SURGICAL SUPPLY — 36 items
CLAMP CORD UMBIL (MISCELLANEOUS) ×2 IMPLANT
CLOTH BEACON ORANGE TIMEOUT ST (SAFETY) ×2 IMPLANT
CONTAINER PREFILL 10% NBF 15ML (MISCELLANEOUS) IMPLANT
DRAPE LG THREE QUARTER DISP (DRAPES) ×2 IMPLANT
DRSG OPSITE POSTOP 4X10 (GAUZE/BANDAGES/DRESSINGS) ×2 IMPLANT
DURAPREP 26ML APPLICATOR (WOUND CARE) ×2 IMPLANT
ELECT REM PT RETURN 9FT ADLT (ELECTROSURGICAL) ×2
ELECTRODE REM PT RTRN 9FT ADLT (ELECTROSURGICAL) ×1 IMPLANT
EXTRACTOR VACUUM M CUP 4 TUBE (SUCTIONS) IMPLANT
GLOVE BIO SURGEON STRL SZ 6.5 (GLOVE) ×2 IMPLANT
GLOVE BIO SURGEON STRL SZ7.5 (GLOVE) ×2 IMPLANT
GLOVE BIOGEL PI IND STRL 8.5 (GLOVE) ×3 IMPLANT
GLOVE BIOGEL PI INDICATOR 8.5 (GLOVE) ×3
GLOVE SURG SS PI 6.5 STRL IVOR (GLOVE) ×2 IMPLANT
GOWN STRL REUS W/TWL LRG LVL3 (GOWN DISPOSABLE) ×8 IMPLANT
KIT ABG SYR 3ML LUER SLIP (SYRINGE) IMPLANT
NEEDLE HYPO 25X1 1.5 SAFETY (NEEDLE) ×2 IMPLANT
NEEDLE HYPO 25X5/8 SAFETYGLIDE (NEEDLE) IMPLANT
NEEDLE SPNL 20GX3.5 QUINCKE YW (NEEDLE) IMPLANT
NS IRRIG 1000ML POUR BTL (IV SOLUTION) ×2 IMPLANT
PACK C SECTION WH (CUSTOM PROCEDURE TRAY) ×2 IMPLANT
STAPLER VISISTAT 35W (STAPLE) IMPLANT
SUT MNCRL 0 VIOLET CTX 36 (SUTURE) ×2 IMPLANT
SUT MNCRL AB 3-0 PS2 27 (SUTURE) ×2 IMPLANT
SUT MON AB 2-0 CT1 27 (SUTURE) ×2 IMPLANT
SUT MON AB-0 CT1 36 (SUTURE) ×4 IMPLANT
SUT MONOCRYL 0 CTX 36 (SUTURE) ×2
SUT PLAIN 0 NONE (SUTURE) IMPLANT
SUT PLAIN 2 0 (SUTURE) ×1
SUT PLAIN 2 0 XLH (SUTURE) IMPLANT
SUT PLAIN ABS 2-0 CT1 27XMFL (SUTURE) ×1 IMPLANT
SYR 20CC LL (SYRINGE) IMPLANT
SYR CONTROL 10ML LL (SYRINGE) ×2 IMPLANT
TOWEL OR 17X24 6PK STRL BLUE (TOWEL DISPOSABLE) ×2 IMPLANT
TRAY FOLEY CATH 14FR (SET/KITS/TRAYS/PACK) ×2 IMPLANT
WATER STERILE IRR 1000ML POUR (IV SOLUTION) ×2 IMPLANT

## 2013-12-19 NOTE — Anesthesia Preprocedure Evaluation (Signed)
Anesthesia Evaluation  Patient identified by MRN, date of birth, ID band Patient awake    Reviewed: Allergy & Precautions, H&P , NPO status , Patient's Chart, lab work & pertinent test results  Airway Mallampati: II TM Distance: >3 FB Neck ROM: Full    Dental no notable dental hx.    Pulmonary neg pulmonary ROS,  breath sounds clear to auscultation  Pulmonary exam normal       Cardiovascular + dysrhythmias (s/p ablation 2014) Supra Ventricular Tachycardia Rhythm:Regular Rate:Normal     Neuro/Psych Multiple sclerosis  Neuromuscular disease negative neurological ROS  negative psych ROS   GI/Hepatic negative GI ROS, Neg liver ROS,   Endo/Other  diabetes  Renal/GU negative Renal ROS  negative genitourinary   Musculoskeletal negative musculoskeletal ROS (+)   Abdominal   Peds negative pediatric ROS (+)  Hematology negative hematology ROS (+)   Anesthesia Other Findings   Reproductive/Obstetrics negative OB ROS (+) Pregnancy                           Anesthesia Physical Anesthesia Plan  ASA: II  Anesthesia Plan: Spinal   Post-op Pain Management:    Induction:   Airway Management Planned: Natural Airway  Additional Equipment:   Intra-op Plan:   Post-operative Plan:   Informed Consent: I have reviewed the patients History and Physical, chart, labs and discussed the procedure including the risks, benefits and alternatives for the proposed anesthesia with the patient or authorized representative who has indicated his/her understanding and acceptance.   Dental advisory given  Plan Discussed with: CRNA  Anesthesia Plan Comments:         Anesthesia Quick Evaluation

## 2013-12-19 NOTE — Anesthesia Postprocedure Evaluation (Signed)
  Anesthesia Post-op Note  Patient: Karen Powers  Procedure(s) Performed: Procedure(s) (LRB): REPEAT CESAREAN SECTION (N/A)  Patient Location: PACU  Anesthesia Type: Spinal  Level of Consciousness: awake and alert   Airway and Oxygen Therapy: Patient Spontanous Breathing  Post-op Pain: mild  Post-op Assessment: Post-op Vital signs reviewed, Patient's Cardiovascular Status Stable, Respiratory Function Stable, Patent Airway and No signs of Nausea or vomiting  Last Vitals:  Filed Vitals:   12/19/13 1415  BP: 132/83  Pulse: 62  Temp: 36.7 C  Resp: 14    Post-op Vital Signs: stable   Complications: No apparent anesthesia complications

## 2013-12-19 NOTE — Anesthesia Procedure Notes (Signed)
Spinal Patient location during procedure: OR Staffing Anesthesiologist: Meribeth Vitug Performed by: anesthesiologist  Preanesthetic Checklist Completed: patient identified, site marked, surgical consent, pre-op evaluation, timeout performed, IV checked, risks and benefits discussed and monitors and equipment checked Spinal Block Patient position: sitting Prep: ChloraPrep Patient monitoring: heart rate, continuous pulse ox and blood pressure Approach: right paramedian Location: L3-4 Injection technique: single-shot Needle Needle type: Sprotte  Needle gauge: 24 G Needle length: 9 cm Additional Notes Expiration date of kit checked and confirmed. Patient tolerated procedure well, without complications.     

## 2013-12-19 NOTE — Anesthesia Postprocedure Evaluation (Signed)
  Anesthesia Post-op Note  Patient: Karen Powers  Procedure(s) Performed: Procedure(s) with comments: REPEAT CESAREAN SECTION (N/A) - EDD: 12/25/13  Patient Location: Mother/Baby  Anesthesia Type:Spinal  Level of Consciousness: awake  Airway and Oxygen Therapy: Patient Spontanous Breathing  Post-op Pain: mild  Post-op Assessment: Patient's Cardiovascular Status Stable and Respiratory Function Stable  Post-op Vital Signs: stable  Last Vitals:  Filed Vitals:   12/19/13 1740  BP: 113/67  Pulse: 69  Temp:   Resp:     Complications: No apparent anesthesia complications

## 2013-12-19 NOTE — Addendum Note (Signed)
Addendum created 12/19/13 1842 by Ignacia Bayley, CRNA   Modules edited: Notes Section   Notes Section:  File: [id number]

## 2013-12-19 NOTE — Transfer of Care (Signed)
Immediate Anesthesia Transfer of Care Note  Patient: Karen Powers  Procedure(s) Performed: Procedure(s) with comments: REPEAT CESAREAN SECTION (N/A) - EDD: 12/25/13  Patient Location: PACU  Anesthesia Type:Spinal  Level of Consciousness: awake  Airway & Oxygen Therapy: Patient Spontanous Breathing  Post-op Assessment: Report given to PACU RN and Post -op Vital signs reviewed and stable  Post vital signs: stable  Complications: No apparent anesthesia complications

## 2013-12-19 NOTE — Progress Notes (Signed)
Patient ID: Karen Powers, female   DOB: 1981/03/12, 33 y.o.   MRN: [id number] Patient seen and examined. Consent witnessed and signed. No changes noted. Update completed. CBC    Component Value Date/Time   WBC 10.2 12/17/2013 1130   RBC 3.55* 12/17/2013 1130   HGB 11.4* 12/17/2013 1130   HCT 35.0* 12/17/2013 1130   PLT 170 12/17/2013 1130   MCV 98.6 12/17/2013 1130   MCH 32.1 12/17/2013 1130   MCHC 32.6 12/17/2013 1130   RDW 12.7 12/17/2013 1130   LYMPHSABS 0.7 12/25/2012 2120   MONOABS 0.7 12/25/2012 2120   EOSABS 0.0 12/25/2012 2120   BASOSABS 0.0 12/25/2012 2120

## 2013-12-19 NOTE — Op Note (Signed)
Cesarean Section Procedure Note  Indications: malpresentation: Pilar Plate breech, oligohydramnios and previous uterine incision Kerr x one  Pre-operative Diagnosis: 39 week 1 day pregnancy.  Post-operative Diagnosis: same  Surgeon: Lovenia Kim   Assistants: Mel Almond, CNM  Anesthesia: Local anesthesia 0.25.% bupivacaine and Spinal anesthesia  ASA Class: 2  Procedure Details  The patient was seen in the Holding Room. The risks, benefits, complications, treatment options, and expected outcomes were discussed with the patient.  The patient concurred with the proposed plan, giving informed consent. The risks of anesthesia, infection, bleeding and possible injury to other organs discussed. Injury to bowel, bladder, or ureter with possible need for repair discussed. Possible need for transfusion with secondary risks of hepatitis or HIV acquisition discussed. Post operative complications to include but not limited to DVT, PE and Pneumonia noted. The site of surgery properly noted/marked. The patient was taken to Operating Room # 2, identified as Karen Powers and the procedure verified as C-Section Delivery. A Time Out was held and the above information confirmed.  After induction of anesthesia, the patient was draped and prepped in the usual sterile manner. A Pfannenstiel incision was made and carried down through the subcutaneous tissue to the fascia. Fascial incision was made and extended transversely using Mayo scissors. The fascia was separated from the underlying rectus tissue superiorly and inferiorly. The peritoneum was identified and entered. Peritoneal incision was extended longitudinally. The utero-vesical peritoneal reflection was incised transversely and the bladder flap was bluntly freed from the lower uterine segment. A low transverse uterine incision(Kerr hysterotomy) was made. Delivered from [place] presentation was a  female with Apgar scores of 9 at one minute and 9 at five minutes.  Bulb suctioning gently performed. Neonatal team in attendance.After the umbilical cord was clamped and cut cord blood was obtained for evaluation. The placenta was removed intact and appeared normal. The uterus was curetted with a dry lap pack. Good hemostasis was noted.The uterine outline, tubes and ovaries appeared normal. The uterine incision was closed with running locked sutures of 0 Monocryl x 2 layers. Hemostasis was observed. Lavage was not done.The parietal peritoneum was closed with a running 2-0 Monocryl suture. The fascia was then reapproximated with running sutures of 0 Monocryl. The skin was reapproximated with 3-0 monocryl after [place] closure with 2-0 plain.  Instrument, sponge, and needle counts were correct prior the abdominal closure and at the conclusion of the case.   Findings: Pilar Plate breech, fundal placenta  Estimated Blood Loss:  200 mL         Drains: foley                 Specimens: placenta                 Complications:  None; patient tolerated the procedure well.         Disposition: PACU - hemodynamically stable.         Condition: stable  Attending Attestation: I performed the procedure.

## 2013-12-19 NOTE — Lactation Note (Signed)
This note was copied from the chart of [place]. Lactation Consultation Note  Patient Name: Karen Powers Date: 12/19/2013 Reason for consult: Initial assessment  Visited with Karen Powers, baby 3 hrs old.  Baby has already fed for 60 mins.  Encouraged skin to skin, and cue based feedings.  Brochure given with instructions on IP and OP lactation services available.  Encouraged to call prn for help.  Follow up prn.    Consult Status Consult Status: Follow-up Date: 12/20/13 Follow-up type: In-patient    Karen Powers 12/19/2013, 2:54 PM

## 2013-12-20 LAB — CBC
HCT: 30.9 % — ABNORMAL LOW (ref 36.0–46.0)
Hemoglobin: 10.6 g/dL — ABNORMAL LOW (ref 12.0–15.0)
MCH: 33.3 pg (ref 26.0–34.0)
MCHC: 34.3 g/dL (ref 30.0–36.0)
MCV: 97.2 fL (ref 78.0–100.0)
Platelets: 132 10*3/uL — ABNORMAL LOW (ref 150–400)
RBC: 3.18 MIL/uL — ABNORMAL LOW (ref 3.87–5.11)
RDW: 12.4 % (ref 11.5–15.5)
WBC: 10.1 10*3/uL (ref 4.0–10.5)

## 2013-12-20 MED ORDER — RHO D IMMUNE GLOBULIN 1500 UNIT/2ML IJ SOLN
300.0000 ug | Freq: Once | INTRAMUSCULAR | Status: AC
  Administered 2013-12-20: 300 ug via INTRAVENOUS
  Filled 2013-12-20: qty 2

## 2013-12-20 MED ORDER — NALBUPHINE HCL 10 MG/ML IJ SOLN
10.0000 mg | Freq: Once | INTRAMUSCULAR | Status: AC
Start: 2013-12-20 — End: 2013-12-20
  Administered 2013-12-20: 10 mg via SUBCUTANEOUS
  Filled 2013-12-20: qty 1

## 2013-12-20 NOTE — Lactation Note (Signed)
This note was copied from the chart of [place]. Lactation Consultation Note  Patient Name: Karen Powers [id number] Date: 12/20/2013  Initial visit with baby at 80 hours of life. Mom states that BF going well. Mom states that she will call if she needs assistance. 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 Mom states baby nursing well.   Maternal Data    Feeding Feeding Type: Breast Fed Length of feed: 55 min  LATCH Score/Interventions                      Lactation Tools Discussed/Used     Consult Status      Andres Labrum 12/20/2013, 9:55 PM

## 2013-12-20 NOTE — Progress Notes (Signed)
POSTOPERATIVE DAY # 1 S/P CS - repeat / breech   S:         Reports feeling well - no concerns             Tolerating po intake / no nausea / no vomiting / + flatus / no BM             Bleeding is light             Pain controlled with motrin and percocet             Up ad lib / ambulatory/ voiding QS  Newborn breast feeding  / Circumcision planned   O:  VS: BP 100/63  Pulse 69  Temp(Src) 97.4 F (36.3 C) (Oral)  Resp 20  Wt 77.565 kg (171 lb)  SpO2 98%  LMP 03/20/2013  Breastfeeding? Unknown   LABS:               Recent Labs  12/17/13 1130 12/20/13 0559  WBC 10.2 10.1  HGB 11.4* 10.6*  PLT 170 132*               Bloodtype: --/--/O NEG (04/26 0559)  Rubella: Immune (09/25 0000)                                             I&O: Intake/Output     04/25 0701 - 04/26 0700 04/26 0701 - 04/27 0700   P.O. 240    I.V. (mL/kg) 3650 (47.1)    Total Intake(mL/kg) 3890 (50.2)    Urine (mL/kg/hr) 4625    Blood 700    Total Output 5325     Net -1435                       Physical Exam:             Alert and Oriented X3  Lungs: Clear and unlabored  Heart: regular rate and rhythm / no mumurs  Abdomen: soft, non-tender, non-distended active BS             Fundus: firm, non-tender, U-1             Dressing intact honeycomb              Incision:  approximated with sutures / no erythema / no ecchymosis / no drainage  Perineum: intact  Lochia: light spotting  Extremities: no edema, no calf pain or tenderness, negative Homans  A:        POD # 1 S/P repeat CS - breech            Mild ABL anemia - stable / no treatment  P:        Routine postoperative care              Anticipate early DC tomorrow             Newborn circumcision today    Artelia Laroche CNM, MSN, [facility] 12/20/2013, 9:56 AM

## 2013-12-21 ENCOUNTER — Encounter (HOSPITAL_COMMUNITY): Payer: Self-pay | Admitting: Obstetrics and Gynecology

## 2013-12-21 LAB — TYPE AND SCREEN
ABO/RH(D): O NEG
Antibody Screen: NEGATIVE
Unit division: 0
Unit division: 0

## 2013-12-21 LAB — RH IG WORKUP (INCLUDES ABO/RH)
ABO/RH(D): O NEG
Fetal Screen: NEGATIVE
Gestational Age(Wks): 39
Unit division: 0

## 2013-12-21 LAB — GLUCOSE, CAPILLARY: Glucose-Capillary: 73 mg/dL (ref 70–99)

## 2013-12-21 NOTE — Progress Notes (Signed)
POD # 2  Subjective: Pt reports feeling well/ Pain controlled with Motrin and Percocet Tolerating po/Voiding without problems/ No n/v/ Flatus present No HA, visual changes, or epigastric pain Activity: ad lib Bleeding is light Newborn info:  Information for the patient's newborn:  Christinea, Brizuela [[id number]]  female  / Circumcision: done/ Feeding: breast   Objective: VS:  Filed Vitals:   12/20/13 0635 12/20/13 1049 12/20/13 1728 12/21/13 0710  BP: 100/63 102/59 104/60 133/86  Pulse: 69 68 70 94  Temp: 97.4 F (36.3 C) 98.1 F (36.7 C) 97.8 F (36.6 C) 98.1 F (36.7 C)  TempSrc: Oral Oral Oral Oral  Resp: 20 18 18 18   Weight:      SpO2: 98% 93%      I&O: Intake/Output     04/26 0701 - 04/27 0700 04/27 0701 - 04/28 0700   P.O.     I.V. (mL/kg)     Total Intake(mL/kg)     Urine (mL/kg/hr) 1250 (0.7)    Blood     Total Output 1250     Net -1250            LABS:  Recent Labs  12/20/13 0559  WBC 10.1  HGB 10.6*  HCT 30.9*  PLT 132*    Blood type: --/--/O NEG (04/26 0559) Rubella: Immune (09/25 0000)     Physical Exam:  General: alert and cooperative CV: Regular rate and rhythm Resp: CTA bilaterally Abdomen: soft, nontender, normal bowel sounds Uterine Fundus: firm, below umbilicus, nontender Incision: Covered with Tegaderm and honeycomb dressing; no drainage, edema, bruising, or erythema; well approximated with suture. GU: trace dependent edema to mons and labia Lochia: minimal Ext: extremities normal, atraumatic, no cyanosis or edema and Homans sign is negative, no sign of DVT    Assessment/: POD # 2/ G3P2012/ S/P C/Section d/t breech and repeat Mild ABL anemia Doing well  Plan: Continue routine post op orders Anticipate discharge home tomorrow   Signed: Graciela Husbands, MSN, CNM 12/21/2013, 9:21 AM

## 2013-12-22 MED ORDER — IBUPROFEN 600 MG PO TABS: 600.0000 mg | ORAL_TABLET | Freq: Four times a day (QID) | ORAL | Status: DC

## 2013-12-22 MED ORDER — OXYCODONE-ACETAMINOPHEN 5-325 MG PO TABS: 1.0000 | ORAL_TABLET | ORAL | Status: DC | PRN

## 2013-12-22 NOTE — Discharge Instructions (Signed)
Breast Pumping Tips °Pumping your breast milk is a good way to stimulate milk production and have a steady supply of breast milk for your infant. Pumping is most helpful during your infant's growth spurts, when involving dad or a family member, or when you are away. There are several types of pumps available. They can be purchased at a baby or maternity store. You can begin pumping soon after delivery, but some experts believe that you should wait about four weeks to give your infant a bottle. °In general, the more you breastfeed or pump, the more milk you will have for your infant. It is also important to take good care of yourself. This will reduce stress and help your body to create a healthy supply of milk. Your caregiver or lactation consultant can give you the information and support you need in your efforts to breastfeed your infant. °PUMPING BREAST MILK  °Follow the tips below for successful breast pumping. °Take care of yourself. °· Drink enough water or fluids to keep urine clear or pale yellow. You may notice a thirsty feeling while breastfeeding. This is because your body needs more water to make breast milk. Keep a large water bottle handy. Make healthy drink choices such as unsweetened fruit juice, milk and water. Limit soda, coffee, and alcohol (wait 2 hours to feed or pump if you have an alcoholic drink.) °· Eat a healthy, well-balanced diet rich in fruits, vegetables, and whole grains. °· Exercise as recommended by your caregiver. °· Get plenty of sleep. Sleep when your infant sleeps. Ask friends and family for help if you need time to nap or rest. °· Do not smoke. Smoking can lower your milk supply and harm your infant. If you need help quitting, ask your caregiver for a program recommendation. °· Ask your caregiver about birth control options. Birth control pills may lower your milk supply. You may be advised to use condoms or other forms of birth control. °Relax and pump °Stimulating your  let-down reflex is the key to successful and effective pumping. This makes the milk in all parts of the breast flow more freely.  °· It is easier to pump breast milk (and breastfeed) while you are relaxed. Find techniques that work for you. Quiet private spaces, breast massage, soothing heat placed on the breast, music, and pictures or a tape recording of your infant may help you to relax and "let down" your milk. If you have difficulty with your let down, try smelling one of your infant's blankets or an item of clothing he or she has worn while you are pumping. °· When pumping, place the special suction cup (flange) directly over the nipple. It may be uncomfortable and cause nipple damage if it is not placed properly or is the wrong size. Applying a small amount of purified or modified lanolin to your nipple and the areola may help increase your comfort level. Also, you can change the speed and suction of many electric pumps to your comfort level. Your caregiver or lactation consultant can help you with this. °· If pumping continues to be painful, or you feel you are not getting very much milk when you pump, you may need a different type of pump. A lactation consultant can help you determine if this is the case. °· If you are with your infant, feed him or her on demand and try pumping after each feeding. This will boost your production, even if milk does not come out. You may not be able   to pump much milk at first, but keep up the routine, and this will change.  If you are working or away from your infant for several hours, try pumping for about 15 minutes every 2 to 3 hours. Pump both breasts at the same time if you can.  If your infant has a formula feeding, make sure you pump your milk around the same time to maintain your supply.  Begin pumping breast milk a few weeks before you return to work. This will help you develop techniques that work for you and will be able to store extra milk.  Find a source  of breastfeeding information that works well for you. TIPS FOR STORING BREAST MILK  Store breast milk in a sealable sterile bag, jar, or container provided with your pumping supplies.  Store milk in small amounts close to what your infant is drinking at each feeding.  Cool pumped milk in a refrigerator or cooler. Pumped milk can last at the back of the refrigerator for 3 to 8 days.  Place cooled milk at the back of the freezer for up to 3 months.  Thaw the milk in its container or bag in warm water up to 24 hours in advance. Do not use a microwave to thaw or heat milk. Do not refreeze the milk after it has been thawed.  Breast milk is safe to drink when left at room temperature (mid 70s or colder) for 4 to 8 hours. After that, throw it away.  Milk fat can separate and look funny. The color can vary slightly from day to day. This is normal. Always shake the milk before using it to mix the fat with the more watery portion. SEEK MEDICAL CARE IF:   You are having trouble pumping or feeding your infant.  You are concerned that you are not making enough milk.  You have nipple pain, soreness, or redness.  You have other questions or concerns related to you or your infant. Document Released: 01/31/2010 Document Revised: 11/05/2011 Document Reviewed: 01/31/2010 [facility] Patient Information 2014 [place]. Postpartum Depression and Baby Blues The postpartum period begins right after the birth of a baby. During this time, there is often a great amount of joy and excitement. It is also a time of considerable changes in the life of the parent(s). Regardless of how many times a mother gives birth, each child brings new challenges and dynamics to the family. It is not unusual to have feelings of excitement accompanied by confusing shifts in moods, emotions, and thoughts. All mothers are at risk of developing postpartum depression or the "baby blues." These mood changes can occur right after giving  birth, or they may occur many months after giving birth. The baby blues or postpartum depression can be mild or severe. Additionally, postpartum depression can resolve rather quickly, or it can be a long-term condition. CAUSES Elevated hormones and their rapid decline are thought to be a main cause of postpartum depression and the baby blues. There are a number of hormones that radically change during and after pregnancy. Estrogen and progesterone usually decrease immediately after delivering your baby. The level of thyroid hormone and various cortisol steroids also rapidly drop. Other factors that play a major role in these changes include major life events and genetics.  RISK FACTORS If you have any of the following risks for the baby blues or postpartum depression, know what symptoms to watch out for during the postpartum period. Risk factors that may increase the likelihood of  getting the baby blues or postpartum depression include:  Havinga personal or family history of depression.  Having depression while being pregnant.  Having premenstrual or oral contraceptive-associated mood issues.  Having exceptional life stress.  Having marital conflict.  Lacking a social support network.  Having a baby with special needs.  Having health problems such as diabetes. SYMPTOMS Baby blues symptoms include:  Brief fluctuations in mood, such as going from extreme happiness to sadness.  Decreased concentration.  Difficulty sleeping.  Crying spells, tearfulness.  Irritability.  Anxiety. Postpartum depression symptoms typically begin within the first month after giving birth. These symptoms include:  Difficulty sleeping or excessive sleepiness.  Marked weight loss.  Agitation.  Feelings of worthlessness.  Lack of interest in activity or food. Postpartum psychosis is a very concerning condition and can be dangerous. Fortunately, it is rare. Displaying any of the following symptoms is  cause for immediate medical attention. Postpartum psychosis symptoms include:  Hallucinations and delusions.  Bizarre or disorganized behavior.  Confusion or disorientation. DIAGNOSIS  A diagnosis is made by an evaluation of your symptoms. There are no medical or lab tests that lead to a diagnosis, but there are various questionnaires that a caregiver may use to identify those with the baby blues, postpartum depression, or psychosis. Often times, a screening tool called the [place] Postnatal Depression Scale is used to diagnose depression in the postpartum period.  TREATMENT The baby blues usually goes away on its own in 1 to 2 weeks. Social support is often all that is needed. You should be encouraged to get adequate sleep and rest. Occasionally, you may be given medicines to help you sleep.  Postpartum depression requires treatment as it can last several months or longer if it is not treated. Treatment may include individual or group therapy, medicine, or both to address any social, physiological, and psychological factors that may play a role in the depression. Regular exercise, a healthy diet, rest, and social support may also be strongly recommended.  Postpartum psychosis is more serious and needs treatment right away. Hospitalization is often needed. HOME CARE INSTRUCTIONS  Get as much rest as you can. Nap when the baby sleeps.  Exercise regularly. Some women find yoga and walking to be beneficial.  Eat a balanced and nourishing diet.  Do little things that you enjoy. Have a cup of tea, take a bubble bath, read your favorite magazine, or listen to your favorite music.  Avoid alcohol.  Ask for help with household chores, cooking, grocery shopping, or running errands as needed. Do not try to do everything.  Talk to people close to you about how you are feeling. Get support from your partner, family members, friends, or other new moms.  Try to stay positive in how you think. Think  about the things you are grateful for.  Do not spend a lot of time alone.  Only take medicine as directed by your caregiver.  Keep all your postpartum appointments.  Let your caregiver know if you have any concerns. SEEK MEDICAL CARE IF: You are having a reaction or problems with your medicine. SEEK IMMEDIATE MEDICAL CARE IF:  You have suicidal feelings.  You feel you may harm the baby or someone else. Document Released: 05/17/2004 Document Revised: 11/05/2011 Document Reviewed: 05/25/2013 [facility] Patient Information 2014 [place], [place]. Breastfeeding and Mastitis Mastitis is inflammation of the breast tissue. It can occur in women who are breastfeeding. This can make breastfeeding painful. Mastitis will sometimes go away on its own. Your  health care provider will help determine if treatment is needed. °CAUSES °Mastitis is often associated with a blocked milk (lactiferous) duct. This can happen when too much milk builds up in the breast. Causes of excess milk in the breast can include: °· Poor latch-on. If your baby is not latched onto the breast properly, she or he may not empty your breast completely while breastfeeding. °· Allowing too much time to pass between feedings. °· Wearing a bra or other clothing that is too tight. This puts extra pressure on the lactiferous ducts so milk does not flow through them as it should. °Mastitis can also be caused by a bacterial infection. Bacteria may enter the breast tissue through cuts or openings in the skin. In women who are breastfeeding, this may occur because of cracked or irritated skin. Cracks in the skin are often caused when your baby does not latch on properly to the breast. °SIGNS AND SYMPTOMS °· Swelling, redness, tenderness, and pain in an area of the breast. °· Swelling of the glands under the arm on the same side. °· Fever may or may not accompany mastitis. °If an infection is allowed to progress, a collection of pus (abscess) may  develop. °DIAGNOSIS  °Your health care provider can usually diagnose mastitis based on your symptoms and a physical exam. Tests may be done to help confirm the diagnosis. These may include: °· Removal of pus from the breast by applying pressure to the area. This pus can be examined in the lab to determine which bacteria are present. If an abscess has developed, the fluid in the abscess can be removed with a needle. This can also be used to confirm the diagnosis and determine the bacteria present. In most cases, pus will not be present. °· Blood tests to determine if your body is fighting a bacterial infection. °· Mammogram or ultrasound tests to rule out other problems or diseases. °TREATMENT  °Mastitis that occurs with breastfeeding will sometimes go away on its own. Your health care provider may choose to wait 24 hours after first seeing you to decide whether a prescription medicine is needed. If your symptoms are worse after 24 hours, your health care provider will likely prescribe an antibiotic to treat the mastitis. He or she will determine which bacteria are most likely causing the infection and will then select an appropriate antibiotic. This is sometimes changed based on the results of tests performed to identify the bacteria, or if there is no response to the antibiotic selected. Antibiotics are usually given by mouth. You may also be given medicine for pain. °HOME CARE INSTRUCTIONS °· Only take over-the-counter or prescription medicines for pain, fever, or discomfort as directed by your health care provider. °· If your health care provider prescribed an antibiotic, take the medicine as directed. Make sure you finish it even if you start to feel better. °· Do not wear a tight or underwire bra. Wear a soft, supportive bra. °· Increase your fluid intake, especially if you have a fever. °· Continue to empty the breast. Your health care provider can tell you whether this milk is safe for your infant or needs to  be thrown out. You may be told to stop nursing until your health care provider thinks it is safe for your baby. Use a breast pump if you are advised to stop nursing. °· Keep your nipples clean and dry. °· Empty the first breast completely before going to the other breast. If your baby is not emptying   your breasts completely for some reason, use a breast pump to empty your breasts. °· If you go back to work, pump your breasts while at work to stay in time with your nursing schedule. °· Avoid allowing your breasts to become overly filled with milk (engorged). °SEEK MEDICAL CARE IF: °· You have pus-like discharge from the breast. °· Your symptoms do not improve with the treatment prescribed by your health care provider within 2 days. °SEEK IMMEDIATE MEDICAL CARE IF: °· Your pain and swelling are getting worse. °· You have pain that is not controlled with medicine. °· You have a red line extending from the breast toward your armpit. °· You have a fever or persistent symptoms for more than 2 3 days. °· You have a fever and your symptoms suddenly get worse. °MAKE SURE YOU:  °· Understand these instructions. °· Will watch your condition. °· Will get help right away if you are not doing well or get worse. °Document Released: 12/08/2004 Document Revised: 04/15/2013 Document Reviewed: 03/19/2013 °[employer]® Patient Information ©2014 [employer]. °Breastfeeding °Deciding to breastfeed is one of the best choices you can make for you and your baby. A change in hormones during pregnancy causes your breast tissue to grow and increases the number and size of your milk ducts. These hormones also allow proteins, sugars, and fats from your blood supply to make breast milk in your milk-producing glands. Hormones prevent breast milk from being released before your baby is born as well as prompt milk flow after birth. Once breastfeeding has begun, thoughts of your baby, as well as his or her sucking or crying, can stimulate the release  of milk from your milk-producing glands.  °BENEFITS OF BREASTFEEDING °For Your Baby °· Your first milk (colostrum) helps your baby's digestive system function better.   °· There are antibodies in your milk that help your baby fight off infections.   °· Your baby has a lower incidence of asthma, allergies, and sudden infant death syndrome.   °· The nutrients in breast milk are better for your baby than infant formulas and are designed uniquely for your baby's needs.   °· Breast milk improves your baby's brain development.   °· Your baby is less likely to develop other conditions, such as childhood obesity, asthma, or type 2 diabetes mellitus.   °For You  °· Breastfeeding helps to create a very special bond between you and your baby.   °· Breastfeeding is convenient. Breast milk is always available at the correct temperature and costs nothing.   °· Breastfeeding helps to burn calories and helps you lose the weight gained during pregnancy.   °· Breastfeeding makes your uterus contract to its prepregnancy size faster and slows bleeding (lochia) after you give birth.   °· Breastfeeding helps to lower your risk of developing type 2 diabetes mellitus, osteoporosis, and breast or ovarian cancer later in life. °SIGNS THAT YOUR BABY IS HUNGRY °Early Signs of Hunger  °· Increased alertness or activity. °· Stretching. °· Movement of the head from side to side. °· Movement of the head and opening of the mouth when the corner of the mouth or cheek is stroked (rooting). °· Increased sucking sounds, smacking lips, cooing, sighing, or squeaking. °· Hand-to-mouth movements. °· Increased sucking of fingers or hands. °Late Signs of Hunger °· Fussing. °· Intermittent crying. °Extreme Signs of Hunger °Signs of extreme hunger will require calming and consoling before your baby will be able to breastfeed successfully. Do not wait for the following signs of extreme hunger to occur before you initiate breastfeeding:   °·   Restlessness. °· A  loud, strong cry. °·  Screaming. °BREASTFEEDING BASICS °Breastfeeding Initiation °· Find a comfortable place to sit or lie down, with your neck and back well supported. °· Place a pillow or rolled up blanket under your baby to bring him or her to the level of your breast (if you are seated). Nursing pillows are specially designed to help support your arms and your baby while you breastfeed. °· Make sure that your baby's abdomen is facing your abdomen.   °· Gently massage your breast. With your fingertips, massage from your chest wall toward your nipple in a circular motion. This encourages milk flow. You may need to continue this action during the feeding if your milk flows slowly. °· Support your breast with 4 fingers underneath and your thumb above your nipple. Make sure your fingers are well away from your nipple and your baby's mouth.   °· Stroke your baby's lips gently with your finger or nipple.   °· When your baby's mouth is open wide enough, quickly bring your baby to your breast, placing your entire nipple and as much of the colored area around your nipple (areola) as possible into your baby's mouth.   °· More areola should be visible above your baby's upper lip than below the lower lip.   °· Your baby's tongue should be between his or her lower gum and your breast.   °· Ensure that your baby's mouth is correctly positioned around your nipple (latched). Your baby's lips should create a seal on your breast and be turned out (everted). °· It is common for your baby to suck about 2 3 minutes in order to start the flow of breast milk. °Latching °Teaching your baby how to latch on to your breast properly is very important. An improper latch can cause nipple pain and decreased milk supply for you and poor weight gain in your baby. Also, if your baby is not latched onto your nipple properly, he or she may swallow some air during feeding. This can make your baby fussy. Burping your baby when you switch breasts  during the feeding can help to get rid of the air. However, teaching your baby to latch on properly is still the best way to prevent fussiness from swallowing air while breastfeeding. °Signs that your baby has successfully latched on to your nipple:    °· Silent tugging or silent sucking, without causing you pain.   °· Swallowing heard between every 3 4 sucks.   °·  Muscle movement above and in front of his or her ears while sucking.   °Signs that your baby has not successfully latched on to nipple:  °· Sucking sounds or smacking sounds from your baby while breastfeeding. °· Nipple pain. °If you think your baby has not latched on correctly, slip your finger into the corner of your baby's mouth to break the suction and place it between your baby's gums. Attempt breastfeeding initiation again. °Signs of Successful Breastfeeding °Signs from your baby:   °· A gradual decrease in the number of sucks or complete cessation of sucking.   °· Falling asleep.   °· Relaxation of his or her body.   °· Retention of a small amount of milk in his or her mouth.   °· Letting go of your breast by himself or herself. °Signs from you: °· Breasts that have increased in firmness, weight, and size 1 3 hours after feeding.   °· Breasts that are softer immediately after breastfeeding. °· Increased milk volume, as well as a change in milk consistency and color by the   5th day of breastfeeding.   Nipples that are not sore, cracked, or bleeding. Signs That Your [employer] is Getting Enough Milk  Wetting at least 3 diapers in a 24-hour period. The urine should be clear and pale yellow by age 581 days.  At least 3 stools in a 24-hour period by age 581 days. The stool should be soft and yellow.  At least 3 stools in a 24-hour period by age 58 days. The stool should be seedy and yellow.  No loss of weight greater than 10% of birth weight during the first 69 days of age.  Average weight gain of 4 7 ounces (120 210 mL) per week after age 58  days.  Consistent daily weight gain by age 580 days, without weight loss after the age of 2 weeks. After a feeding, your baby may spit up a small amount. This is common. BREASTFEEDING FREQUENCY AND DURATION Frequent feeding will help you make more milk and can prevent sore nipples and breast engorgement. Breastfeed when you feel the need to reduce the fullness of your breasts or when your baby shows signs of hunger. This is called "breastfeeding on demand." Avoid introducing a pacifier to your baby while you are working to establish breastfeeding (the first 4 6 weeks after your baby is born). After this time you may choose to use a pacifier. Research has shown that pacifier use during the first year of a baby's life decreases the risk of sudden infant death syndrome (SIDS). Allow your baby to feed on each breast as long as he or she wants. Breastfeed until your baby is finished feeding. When your baby unlatches or falls asleep while feeding from the first breast, offer the second breast. Because newborns are often sleepy in the first few weeks of life, you may need to awaken your baby to get him or her to feed. Breastfeeding times will vary from baby to baby. However, the following rules can serve as a guide to help you ensure that your baby is properly fed:  Newborns (babies 57 weeks of age or younger) may breastfeed every 1 3 hours.  Newborns should not go longer than 3 hours during the day or 5 hours during the night without breastfeeding.  You should breastfeed your baby a minimum of 8 times in a 24-hour period until you begin to introduce solid foods to your baby at around 35 months of age. BREAST MILK PUMPING Pumping and storing breast milk allows you to ensure that your baby is exclusively fed your breast milk, even at times when you are unable to breastfeed. This is especially important if you are going back to work while you are still breastfeeding or when you are not able to be present during  feedings. Your lactation consultant can give you guidelines on how long it is safe to store breast milk.  A breast pump is a machine that allows you to pump milk from your breast into a sterile bottle. The pumped breast milk can then be stored in a refrigerator or freezer. Some breast pumps are operated by hand, while others use electricity. Ask your lactation consultant which type will work best for you. Breast pumps can be purchased, but some hospitals and breastfeeding support groups lease breast pumps on a monthly basis. A lactation consultant can teach you how to hand express breast milk, if you prefer not to use a pump.  CARING FOR YOUR BREASTS WHILE YOU BREASTFEED Nipples can become dry, cracked, and sore while breastfeeding.  The following recommendations can help keep your breasts moisturized and healthy:  Avoid using soap on your nipples.   Wear a supportive bra. Although not required, special nursing bras and tank tops are designed to allow access to your breasts for breastfeeding without taking off your entire bra or top. Avoid wearing underwire style bras or extremely tight bras.  Air dry your nipples for 3 24minutes after each feeding.   Use only cotton bra pads to absorb leaked breast milk. Leaking of breast milk between feedings is normal.   Use lanolin on your nipples after breastfeeding. Lanolin helps to maintain your skin's normal moisture barrier. If you use pure lanolin you do not need to wash it off before feeding your baby again. Pure lanolin is not toxic to your baby. You may also hand express a few drops of breast milk and gently massage that milk into your nipples and allow the milk to air dry. In the first few weeks after giving birth, some women experience extremely full breasts (engorgement). Engorgement can make your breasts feel heavy, warm, and tender to the touch. Engorgement peaks within 3 5 days after you give birth. The following recommendations can help ease  engorgement:  Completely empty your breasts while breastfeeding or pumping. You may want to start by applying warm, moist heat (in the shower or with warm water-soaked hand towels) just before feeding or pumping. This increases circulation and helps the milk flow. If your baby does not completely empty your breasts while breastfeeding, pump any extra milk after he or she is finished.  Wear a snug bra (nursing or regular) or tank top for 1 2 days to signal your body to slightly decrease milk production.  Apply ice packs to your breasts, unless this is too uncomfortable for you.  Make sure that your baby is latched on and positioned properly while breastfeeding. If engorgement persists after 48 hours of following these recommendations, contact your health care provider or a Science writer. OVERALL HEALTH CARE RECOMMENDATIONS WHILE BREASTFEEDING  Eat healthy foods. Alternate between meals and snacks, eating 3 of each per day. Because what you eat affects your breast milk, some of the foods may make your baby more irritable than usual. Avoid eating these foods if you are sure that they are negatively affecting your baby.  Drink milk, fruit juice, and water to satisfy your thirst (about 10 glasses a day).   Rest often, relax, and continue to take your prenatal vitamins to prevent fatigue, stress, and anemia.  Continue breast self-awareness checks.  Avoid chewing and smoking tobacco.  Avoid alcohol and drug use. Some medicines that may be harmful to your baby can pass through breast milk. It is important to ask your health care provider before taking any medicine, including all over-the-counter and prescription medicine as well as vitamin and herbal supplements. It is possible to become pregnant while breastfeeding. If birth control is desired, ask your health care provider about options that will be safe for your baby. SEEK MEDICAL CARE IF:   You feel like you want to stop breastfeeding  or have become frustrated with breastfeeding.  You have painful breasts or nipples.  Your nipples are cracked or bleeding.  Your breasts are red, tender, or warm.  You have a swollen area on either breast.  You have a fever or chills.  You have nausea or vomiting.  You have drainage other than breast milk from your nipples.  Your breasts do not become full before feedings by  the 5th day after you give birth.  You feel sad and depressed.  Your baby is too sleepy to eat well.  Your baby is having trouble sleeping.   Your baby is wetting less than 3 diapers in a 24-hour period.  Your baby has less than 3 stools in a 24-hour period.  Your baby's skin or the white part of his or her eyes becomes yellow.   Your baby is not gaining weight by 8 days of age. SEEK IMMEDIATE MEDICAL CARE IF:   Your baby is overly tired (lethargic) and does not want to wake up and feed.  Your baby develops an unexplained fever. Document Released: 08/13/2005 Document Revised: 04/15/2013 Document Reviewed: 02/04/2013 [facility] Patient Information 2014 [place].

## 2013-12-22 NOTE — Discharge Summary (Signed)
POSTOPERATIVE DISCHARGE SUMMARY:  Patient ID: Karen Powers MRN: [id number] DOB/AGE: July 08, 1981 33 y.o.  Admit date: 12/19/2013 Admission Diagnoses: Repeat Cesarean Delivery for Breech   Discharge date: 12/22/2013 Discharge Diagnoses: S/P Repeat Cesarean Delivery d/t Breech  Prenatal history: [id number]   EDC : 12/25/2013, by Last Menstrual Period  Prenatal care at [facility]  Primary provider : Dr. Ronita Hipps Prenatal course complicated by oligohydramnios and persistent breech presentation  Prenatal Labs: ABO, Rh: --/--/O NEG (04/26 0559) / Rhophylac done 12/20/2013 (not done at 28 wks - Dr. Ronita Hipps notified by RN) Antibody: NEG (04/23 1130) Rubella: Immune (09/25 0000)  RPR: NON REAC (04/23 1130)  HBsAg: Negative (09/25 0000)  HIV: Non-reactive (09/25 0000)  GTT : 151, abnormal 3 hr GTT GBS:  Negative  Medical / Surgical History :  Past medical history:  Past Medical History  Diagnosis Date  . SVT (supraventricular tachycardia)     No problems since 10/2012 - no med needed  . MS (multiple sclerosis)   . Gestational diabetes mellitus, antepartum     Diet controlled  . Heartburn in pregnancy   . Headache(784.0)     otc med prn  . Neuromuscular disorder     MS    Past surgical history:  Past Surgical History  Procedure Laterality Date  . Atrial ablation surgery  2003    FAILED  . Cesarean section  2006    Breech  . Cesarean section  2006    breech  . Wisdom tooth extraction    . Cesarean section N/A 12/19/2013    Procedure: REPEAT CESAREAN SECTION;  Surgeon: Lovenia Kim, MD;  Location: [facility];  Service: Obstetrics;  Laterality: N/A;  EDD: 12/25/13    Family History: History reviewed. No pertinent family history.  Social History:  reports that she has never smoked. She has never used smokeless tobacco. She reports that she does not drink alcohol or use illicit drugs.  Allergies: Review of patient's allergies indicates no known allergies.   Current  Medications at time of admission:  Prior to Admission medications   Medication Sig Start Date End Date Taking? Authorizing Provider  acetaminophen (TYLENOL) 325 MG tablet Take 975 mg by mouth every 6 (six) hours as needed for pain.    Historical Provider, MD  diphenhydrAMINE-zinc acetate (BENADRYL) cream Apply topically daily as needed for itching.    Historical Provider, MD  ibuprofen (ADVIL,MOTRIN) 600 MG tablet Take 1 tablet (600 mg total) by mouth every 6 (six) hours. 12/22/13   Sarkis Rhines Octaviano Glow, CNM  oxyCODONE-acetaminophen (PERCOCET/ROXICET) 5-325 MG per tablet Take 1-2 tablets by mouth every 4 (four) hours as needed for severe pain (moderate - severe pain). 12/22/13   Kathrynne Kulinski Octaviano Glow, CNM  Prenatal Vit-Fe Fumarate-FA (PRENATAL MULTIVITAMIN) TABS tablet Take 1 tablet by mouth daily at 12 noon.    Historical Provider, MD  ranitidine (ZANTAC) 150 MG tablet Take 150 mg by mouth 2 (two) times daily as needed for heartburn.    Historical Provider, MD    Intrapartum Course:  Admitted for scheduled repeat cesarean delivery for oligohydramnios and persistent breech Pain management: spinal Complicated by: none Interventions required: none     Procedures: Scheduled Cesarean section delivery on 12/19/2013 with delivery of  Viable female newborn by Dr Ronita Hipps   See operative report for further details APGAR (1 MIN): 9   APGAR (5 MINS): 9    Postoperative / postpartum course:  Uncomplicated with discharge on POD 3 Complicated by: none  Discharge  Instructions:  Discharged Condition: stable  Activity: pelvic rest and postoperative restrictions x 2   Diet: routine  Medications:    Medication List         acetaminophen 325 MG tablet  Commonly known as:  TYLENOL  Take 975 mg by mouth every 6 (six) hours as needed for pain.     BENADRYL cream  Generic drug:  diphenhydrAMINE-zinc acetate  Apply topically daily as needed for itching.     ibuprofen 600 MG tablet  Commonly known as:   ADVIL,MOTRIN  Take 1 tablet (600 mg total) by mouth every 6 (six) hours.     oxyCODONE-acetaminophen 5-325 MG per tablet  Commonly known as:  PERCOCET/ROXICET  Take 1-2 tablets by mouth every 4 (four) hours as needed for severe pain (moderate - severe pain).     prenatal multivitamin Tabs tablet  Take 1 tablet by mouth daily at 12 noon.     ranitidine 150 MG tablet  Commonly known as:  ZANTAC  Take 150 mg by mouth 2 (two) times daily as needed for heartburn.        Wound Care: keep clean and dry / remove honeycomb POD 7 (12/26/2013) Postpartum Instructions: [facility] discharge booklet - instructions reviewed  Discharge to: Home  Follow up :  [facility] in 6 weeks for routine postpartum visit with Dr. Ronita Hipps                Signed: Graceann Congress MSN, CNM  12/22/2013, 10:16 AM

## 2013-12-22 NOTE — Progress Notes (Signed)
POD # 3  Subjective: Pt reports feeling well/ Pain controlled with Motrin and Percocet Tolerating po/Voiding without problems/ No n/v/ Flatus present No HA, visual changes, or epigastric pain Activity: ad lib Bleeding is light Newborn info:  Information for the patient's newborn:  Karen Powers, Karen Powers [[id number]]  female  Circumcision: done/ Feeding: breast   Objective: VS:  Filed Vitals:   12/20/13 1728 12/21/13 0710 12/21/13 1748 12/22/13 0530  BP: 104/60 133/86 123/82 116/80  Pulse: 70 94 94 76  Temp: 97.8 F (36.6 C) 98.1 F (36.7 C) 98.3 F (36.8 C) 97.8 F (36.6 C)  TempSrc: Oral Oral Axillary Oral  Resp: 18 18 18 18   Weight:      SpO2:        I&O: Intake/Output     04/27 0701 - 04/28 0700 04/28 0701 - 04/29 0700   Urine (mL/kg/hr)     Total Output       Net              LABS:   Recent Labs  12/20/13 0559  WBC 10.1  HGB 10.6*  HCT 30.9*  PLT 132*    Blood type: O NEG (04/26 0559) Rubella: Immune (09/25 0000)     Physical Exam:  General: alert and cooperative CV: Regular rate and rhythm Resp: CTA bilaterally Abdomen: soft, nontender, normal bowel sounds Uterine Fundus: firm, 3 FB below umbilicus, nontender Incision: Covered with Tegaderm and honeycomb dressing; no drainage, edema, bruising, or erythema; well approximated with suture. GU: moderate dependent edema to mons and labia Lochia: minimal Ext: extremities normal, atraumatic, no cyanosis or edema and Homans sign is negative, no sign of DVT    Assessment/: POD # 3 / G3P2012/ S/P C/Section d/t breech and repeat Mild ABL anemia Moderate Edema (of mons pubis & labia)  Doing well  Plan: Continue routine post op orders Ice pack to mons pubis and labia for edema Discharge home today   Signed: Graceann Congress, MSN, CNM 12/22/2013, 10:07 AM

## 2014-06-28 ENCOUNTER — Encounter (HOSPITAL_COMMUNITY): Payer: Self-pay | Admitting: Obstetrics and Gynecology

## 2014-09-08 ENCOUNTER — Encounter: Payer: Self-pay | Admitting: Medical

## 2014-09-08 ENCOUNTER — Ambulatory Visit (INDEPENDENT_AMBULATORY_CARE_PROVIDER_SITE_OTHER): Payer: 59 | Admitting: Medical

## 2014-09-08 VITALS — BP 120/79 | HR 82 | Temp 98.7°F | Ht 69.0 in | Wt 146.6 lb

## 2014-09-08 DIAGNOSIS — J029 Acute pharyngitis, unspecified: Secondary | ICD-10-CM

## 2014-09-08 LAB — POCT RAPID STREP A (OFFICE): Rapid Strep A Screen: NEGATIVE

## 2014-09-08 MED ORDER — AZITHROMYCIN 250 MG PO TABS: ORAL_TABLET | ORAL | Status: DC

## 2014-09-08 NOTE — Assessment & Plan Note (Signed)
Your strep test was negative. However, your physical exam and clinical presentation(family visits + occupation) is suspicious for strep and it is important to note that rapid strep test can be falsely negative. So I am going to give you azithromcyin  antibiotic today based on your exam and clinical presentation.  Rest hydrate, tylenol for fever, and warm salt water gargles.   Follow up in 7 days or as needed.   If you want you could schedule CPE with fasting labs in near future/at your convenience. Also we have female providers and if papsmears/pelvic needed those can be set up with female if you choose.

## 2014-09-08 NOTE — Progress Notes (Signed)
Pre visit review using our clinic review tool, if applicable. No additional management support is needed unless otherwise documented below in the visit note. 

## 2014-09-08 NOTE — Progress Notes (Signed)
Subjective:    Patient ID: Karen Powers, female    DOB: Jan 20, 1981, 34 y.o.   MRN: [id number]  HPI   I have reviewed pt PMH, PSH, FH, Social History and Surgical History  Pt has hx of paroxysmal svt. Last episode she had was march 2014. Ablation considered but area was to close to SA node.   Pt has MS. Pt sees neurologist Ala Bent at [facility] MS is under control presently. Pt had option of being on medication. But declined since she is breast feeding.  Pt has hx of gestational diabetes only. 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 Her check maybe low 70 rather than high.  Pt works as Marine scientist at [employer], Not working out except for work 12hrs, caffeine soda(some day none other days a lot), divorced. 34 yr old and 22 month old. Currently breast feeding(She is trying to breast feed for 1 yr)  Pt had mild sorethroat and white spots this weekend. This has been going on for one week. Pt voice better. Low level st. Pt sister in law had strep and nephew may have had strep. Mild ha. Mild cough.   Past Medical History  Diagnosis Date  . SVT (supraventricular tachycardia)     No problems since 10/2012 - no med needed  . MS (multiple sclerosis)   . Heartburn in pregnancy   . Headache(784.0)     otc med prn  . Neuromuscular disorder     MS  . Diabetes mellitus without complication     Gestational diabetes only.    History   Social History  . Marital Status: Single    Spouse Name: N/A    Number of Children: N/A  . Years of Education: N/A   Occupational History  . Not on file.   Social History Main Topics  . Smoking status: Never Smoker   . Smokeless tobacco: Never Used  . Alcohol Use: No     Comment: occasional  . Drug Use: No  . Sexual Activity: Yes    Birth Control/ Protection: None     Comment: pregnant   Other Topics Concern  . Not on file   Social History Narrative   ** Merged History Encounter **        Past Surgical History  Procedure  Laterality Date  . Atrial ablation surgery  2003    FAILED  . Cesarean section  2006    Breech  . Cesarean section  2006    breech  . Wisdom tooth extraction    . Cesarean section N/A 12/19/2013    Procedure: REPEAT CESAREAN SECTION;  Surgeon: Lovenia Kim, MD;  Location: [facility];  Service: Obstetrics;  Laterality: N/A;  EDD: 12/25/13    Family History  Problem Relation Age of Onset  . Multiple sclerosis Mother   . Emphysema Father     Allergies  Allergen Reactions  . Ciprofloxacin Rash    Current Outpatient Prescriptions on File Prior to Visit  Medication Sig Dispense Refill  . acetaminophen (TYLENOL) 325 MG tablet Take 975 mg by mouth every 6 (six) hours as needed for pain.    . diphenhydrAMINE-zinc acetate (BENADRYL) cream Apply topically daily as needed for itching.    Marland Kitchen ibuprofen (ADVIL,MOTRIN) 600 MG tablet Take 1 tablet (600 mg total) by mouth every 6 (six) hours. (Patient not taking: Reported on 09/08/2014) 30 tablet 1  . oxyCODONE-acetaminophen (PERCOCET/ROXICET) 5-325 MG per tablet Take 1-2 tablets by  mouth every 4 (four) hours as needed for severe pain (moderate - severe pain). (Patient not taking: Reported on 09/08/2014) 30 tablet 0  . Prenatal Vit-Fe Fumarate-FA (PRENATAL MULTIVITAMIN) TABS tablet Take 1 tablet by mouth daily at 12 noon.    . ranitidine (ZANTAC) 150 MG tablet Take 150 mg by mouth 2 (two) times daily as needed for heartburn.     No current facility-administered medications on file prior to visit.    BP 120/79 mmHg  Pulse 82  Temp(Src) 98.7 F (37.1 C) (Oral)  Ht 5\' 9"  (1.753 m)  Wt 146 lb 9.6 oz (66.497 kg)  BMI 21.64 kg/m2  SpO2 98%  LMP 12/07/2013       Review of Systems  Constitutional: Negative for fever, chills and fatigue.  HENT: Positive for sore throat.   Respiratory: Positive for cough. Negative for chest tightness, shortness of breath and wheezing.   Cardiovascular: Negative for chest pain and palpitations.  Gastrointestinal:  Negative for abdominal pain.  Genitourinary: Negative.   Musculoskeletal: Negative for back pain, neck pain and neck stiffness.  Neurological: Positive for headaches. Negative for dizziness, tremors, seizures, syncope, speech difficulty, weakness, light-headedness and numbness.       Faint ha.  Hematological: Negative for adenopathy. Does not bruise/bleed easily.       Objective:   Physical Exam   General  Mental Status - Alert. General Appearance - Well groomed. Not in acute distress.  Skin Rashes- No Rashes.  HEENT Head- Normal. Ear Auditory Canal - Left- Normal. Right - Normal.Tympanic Membrane- Left- Normal. Right- Normal. Eye Sclera/Conjunctiva- Left- Normal. Right- Normal. Nose & Sinuses Nasal Mucosa- Left-  Not boggy or Congested. Right-  Not  boggy or Congested. Mouth & Throat Lips: Upper Lip- Normal: no dryness, cracking, pallor, cyanosis, or vesicular eruption. Lower Lip-Normal: no dryness, cracking, pallor, cyanosis or vesicular eruption. Buccal Mucosa- Bilateral- No Aphthous ulcers. Oropharynx- No Discharge or Erythema. Tonsils: Characteristics- Bilateral- No Erythema or Congestion. Size/Enlargement- Bilateral- 2+  enlargement. Discharge- only left tonsil scant mild white dc from tonsillar crypt.  Neck Neck- Supple. No Masses. Faint mild enlarged submandibular nodes but not tender.   Chest and Lung Exam Auscultation: Breath Sounds:- even and unlabored,   Cardiovascular Auscultation:Rythm- Regular, rate and rhythm. Murmurs & Other Heart Sounds:Ausculatation of the heart reveal- No Murmurs.  Lymphatic Head & Neck General Head & Neck Lymphatics: Bilateral: Description- No Localized lymphadenopathy. See neck.         Assessment & Plan:

## 2014-09-08 NOTE — Patient Instructions (Addendum)
Your strep test was negative. However, your physical exam and clinical presentation(family visits + occupation) is suspicious for strep and it is important to note that rapid strep test can be falsely negative. So I am going to give you azithromcyin  antibiotic today based on your exam and clinical presentation.  Rest hydrate, tylenol for fever, and warm salt water gargles.   Follow up in 7 days or as needed.   If you want you could schedule CPE with fasting labs in near future/at your convenience. Also we have female providers and if papsmears/pelvic needed those can be set up with female if you choose.

## 2014-09-28 ENCOUNTER — Ambulatory Visit (INDEPENDENT_AMBULATORY_CARE_PROVIDER_SITE_OTHER): Payer: 59 | Admitting: Medical

## 2014-09-28 ENCOUNTER — Encounter: Payer: Self-pay | Admitting: Medical

## 2014-09-28 VITALS — BP 116/76 | HR 62 | Temp 97.9°F | Ht 69.0 in | Wt 148.4 lb

## 2014-09-28 DIAGNOSIS — Z0000 Encounter for general adult medical examination without abnormal findings: Secondary | ICD-10-CM

## 2014-09-28 LAB — LIPID PANEL
Cholesterol: 149 mg/dL (ref 0–200)
HDL: 68.3 mg/dL (ref >39.00)
LDL Cholesterol: 72 mg/dL (ref 0–99)
NonHDL: 80.7
Total CHOL/HDL Ratio: 2
Triglycerides: 46 mg/dL (ref 0.0–149.0)
VLDL: 9.2 mg/dL (ref 0.0–40.0)

## 2014-09-28 LAB — COMPREHENSIVE METABOLIC PANEL
ALT: 15 U/L (ref 0–35)
AST: 16 U/L (ref 0–37)
Albumin: 4.4 g/dL (ref 3.5–5.2)
Alkaline Phosphatase: 82 U/L (ref 39–117)
BUN: 10 mg/dL (ref 6–23)
CO2: 29 mEq/L (ref 19–32)
Calcium: 9 mg/dL (ref 8.4–10.5)
Chloride: 106 mEq/L (ref 96–112)
Creatinine, Ser: 0.63 mg/dL (ref 0.40–1.20)
GFR: 115.43 mL/min (ref >60.00)
Glucose, Bld: 85 mg/dL (ref 70–99)
Potassium: 3.8 mEq/L (ref 3.5–5.1)
Sodium: 140 mEq/L (ref 135–145)
Total Bilirubin: 0.7 mg/dL (ref 0.2–1.2)
Total Protein: 6.5 g/dL (ref 6.0–8.3)

## 2014-09-28 LAB — POCT URINALYSIS DIPSTICK
Bilirubin, UA: NEGATIVE
Blood, UA: NEGATIVE
Glucose, UA: NEGATIVE
Ketones, UA: NEGATIVE
Leukocytes, UA: NEGATIVE
Nitrite, UA: NEGATIVE
Spec Grav, UA: 1.015
Urobilinogen, UA: 0.2
pH, UA: 6

## 2014-09-28 LAB — CBC WITH DIFFERENTIAL/PLATELET
Basophils Absolute: 0 10*3/uL (ref 0.0–0.1)
Basophils Relative: 0.4 % (ref 0.0–3.0)
Eosinophils Absolute: 0.1 10*3/uL (ref 0.0–0.7)
Eosinophils Relative: 2.9 % (ref 0.0–5.0)
HCT: 34.3 % — ABNORMAL LOW (ref 36.0–46.0)
Hemoglobin: 11.8 g/dL — ABNORMAL LOW (ref 12.0–15.0)
Lymphocytes Relative: 45.1 % (ref 12.0–46.0)
Lymphs Abs: 2 10*3/uL (ref 0.7–4.0)
MCHC: 34.4 g/dL (ref 30.0–36.0)
MCV: 91.3 fl (ref 78.0–100.0)
Monocytes Absolute: 0.3 10*3/uL (ref 0.1–1.0)
Monocytes Relative: 6.1 % (ref 3.0–12.0)
Neutro Abs: 2.1 10*3/uL (ref 1.4–7.7)
Neutrophils Relative %: 45.5 % (ref 43.0–77.0)
Platelets: 151 10*3/uL (ref 150.0–400.0)
RBC: 3.76 Mil/uL — ABNORMAL LOW (ref 3.87–5.11)
RDW: 13.4 % (ref 11.5–15.5)
WBC: 4.5 10*3/uL (ref 4.0–10.5)

## 2014-09-28 LAB — TSH: TSH: 1.6 u[IU]/mL (ref 0.35–4.50)

## 2014-09-28 NOTE — Addendum Note (Signed)
Addended by: Vernie Shanks E on: 09/28/2014 10:30 AM   Modules accepted: Orders

## 2014-09-28 NOTE — Progress Notes (Signed)
   Subjective:    Patient ID: Karen Powers, female    DOB: 1981-07-19, 34 y.o.   MRN: [id number]  HPI   Pt in for physical exam. Pt is fasting. Pt has gyn. Pt had recent pap and breast exam.  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 Pt not eating healthy. Moderate caffeine at work. 1 child.  Pt does not smoke. No alcohol.  Pt had fluvaccine.  Pt states she is up to date on tetanus.   Pt has MS. She sees neurologist frequently. Occasional leg pain. Some just yesterday. Usually will go away within a day or 2.   Review of Systems  Constitutional: Negative for fever, chills and fatigue.  HENT: Negative.   Eyes: Negative for pain, discharge, redness and visual disturbance.  Respiratory: Negative for cough, choking, shortness of breath and wheezing.   Cardiovascular: Negative for chest pain and palpitations.  Gastrointestinal: Negative for abdominal pain, diarrhea, constipation, abdominal distention and anal bleeding.  Genitourinary: Negative for dysuria, urgency, frequency, hematuria, flank pain, enuresis, genital sores, menstrual problem and dyspareunia.  Musculoskeletal: Negative for back pain.  Neurological: Negative for dizziness, tremors, seizures, syncope, facial asymmetry, speech difficulty, weakness, light-headedness, numbness and headaches.       Mild rt leg pain faint yesterday.(this happens with her Ms)  Hematological: Negative for adenopathy. Does not bruise/bleed easily.  Psychiatric/Behavioral: Negative for behavioral problems.       Objective:   Physical Exam   General   Mental Status- Alert.  Orientation-Oriented x3. Build and Nutrition Well Nourished and Well Developed.  Skin General: Normal.  Color- Normal color. Moisture- Dry.Temperature warm. Lesions: No suspicious lesions  Head, Eyes, Ears, Nose, Thoat Ears-Normal. Auditory Canal-Bilateral-Normal. Tympanic Membrane- Bilateral-Normal. Eyes Fundi- Bilateral-Normal. Pupil- Bilateral- Direct reaction to light  normal. Nose & Sinuses- Normal. Nostril- Bilateral-Normal.  Neck Neck- No Bruits or Masses. Thyroid- Normal. No thyromegaly or nodules.    Chest and Lung Exam  Percussion: Quality and Intensity:-Percussion normal. Percussion of chest reveals- No Dullness. Palpation of the chest reveals- Non-tender. Auscultation: Breath sounds-Normal. Adventitious  Sounds:No adventitious    Cardiovascular Inspection: No Heaves. Auscultation: Heart Sounds- Normal sinus rhythm without murmur or gallop, S1 WNL and S2 WNL.  Abdomen Inspection:- Inspection Normal. Inspection of abdomen reveals- No Hernias. Palpation/Percussion: Palpation and Percussion of the abdomen reveal- Non Tender and No Palpable masses. Liver: Other Characteristics- No Hepatmegaly Spleen:Other Characteristics- No Splenomegaly. Auscultation: Auscultation of the abdomen reveals-Bowel sounds normal and No Abdominal bruits.   Neurologic Mental Status- Normal Cranial Nerves- Normal Bilaterally, Motor- Normal. Strength:5/5 normal muscle strength- All Muscles.  Meningeal Signs- None.  Musculoskeletal Global Assessment General- Joints show full range of motion without obvious deformity and Normal muscle mass. Strength 5/5 in upper and lower extremities.  Rt leg- on rom,  no obvious pain of hip or knee.  Lymphatic General lymphatics Description-No Generalized lymphadenopathy.           Assessment & Plan:

## 2014-09-28 NOTE — Patient Instructions (Addendum)
You are up to date on flu and tetanus over past year. We did ua today and labs today.  We will call you when results of the blood work area back.  For your rt thigh muscle pain which you attribute to MS, I want you to notify neurologist if pain persists. If unable to contact them please notify us. If pain in joints let me know and would get xrays.  Follow up to be determined when labs are back.  Preventive Care for Adults A healthy lifestyle and preventive care can promote health and wellness. Preventive health guidelines for women include the following key practices.  A routine yearly physical is a good way to check with your health care provider about your health and preventive screening. It is a chance to share any concerns and updates on your health and to receive a thorough exam.  Visit your dentist for a routine exam and preventive care every 6 months. Brush your teeth twice a day and floss once a day. Good oral hygiene prevents tooth decay and gum disease.  The frequency of eye exams is based on your age, health, family medical history, use of contact lenses, and other factors. Follow your health care provider's recommendations for frequency of eye exams.  Eat a healthy diet. Foods like vegetables, fruits, whole grains, low-fat dairy products, and lean protein foods contain the nutrients you need without too many calories. Decrease your intake of foods high in solid fats, added sugars, and salt. Eat the right amount of calories for you.Get information about a proper diet from your health care provider, if necessary.  Regular physical exercise is one of the most important things you can do for your health. Most adults should get at least 150 minutes of moderate-intensity exercise (any activity that increases your heart rate and causes you to sweat) each week. In addition, most adults need muscle-strengthening exercises on 2 or more days a week.  Maintain a healthy weight. The body mass  index (BMI) is a screening tool to identify possible weight problems. It provides an estimate of body fat based on height and weight. Your health care provider can find your BMI and can help you achieve or maintain a healthy weight.For adults 20 years and older:  A BMI below 18.5 is considered underweight.  A BMI of 18.5 to 24.9 is normal.  A BMI of 25 to 29.9 is considered overweight.  A BMI of 30 and above is considered obese.  Maintain normal blood lipids and cholesterol levels by exercising and minimizing your intake of saturated fat. Eat a balanced diet with plenty of fruit and vegetables. Blood tests for lipids and cholesterol should begin at age 59 and be repeated every 5 years. If your lipid or cholesterol levels are high, you are over 50, or you are at high risk for heart disease, you may need your cholesterol levels checked more frequently.Ongoing high lipid and cholesterol levels should be treated with medicines if diet and exercise are not working.  If you smoke, find out from your health care provider how to quit. If you do not use tobacco, do not start.  Lung cancer screening is recommended for adults aged 74-80 years who are at high risk for developing lung cancer because of a history of smoking. A yearly low-dose CT scan of the lungs is recommended for people who have at least a 30-pack-year history of smoking and are a current smoker or have quit within the past 15 years. A pack  year of smoking is smoking an average of 1 pack of cigarettes a day for 1 year (for example: 1 pack a day for 30 years or 2 packs a day for 15 years). Yearly screening should continue until the smoker has stopped smoking for at least 15 years. Yearly screening should be stopped for people who develop a health problem that would prevent them from having lung cancer treatment.  If you are pregnant, do not drink alcohol. If you are breastfeeding, be very cautious about drinking alcohol. If you are not pregnant  and choose to drink alcohol, do not have more than 1 drink per day. One drink is considered to be 12 ounces (355 mL) of beer, 5 ounces (148 mL) of wine, or 1.5 ounces (44 mL) of liquor.  Avoid use of street drugs. Do not share needles with anyone. Ask for help if you need support or instructions about stopping the use of drugs.  High blood pressure causes heart disease and increases the risk of stroke. Your blood pressure should be checked at least every 1 to 2 years. Ongoing high blood pressure should be treated with medicines if weight loss and exercise do not work.  If you are 10-55 years old, ask your health care provider if you should take aspirin to prevent strokes.  Diabetes screening involves taking a blood sample to check your fasting blood sugar level. This should be done once every 3 years, after age 14, if you are within normal weight and without risk factors for diabetes. Testing should be considered at a younger age or be carried out more frequently if you are overweight and have at least 1 risk factor for diabetes.  Breast cancer screening is essential preventive care for women. You should practice "breast self-awareness." This means understanding the normal appearance and feel of your breasts and may include breast self-examination. Any changes detected, no matter how small, should be reported to a health care provider. Women in their 55s and 30s should have a clinical breast exam (CBE) by a health care provider as part of a regular health exam every 1 to 3 years. After age 62, women should have a CBE every year. Starting at age 44, women should consider having a mammogram (breast X-ray test) every year. Women who have a family history of breast cancer should talk to their health care provider about genetic screening. Women at a high risk of breast cancer should talk to their health care providers about having an MRI and a mammogram every year.  Breast cancer gene (BRCA)-related cancer  risk assessment is recommended for women who have family members with BRCA-related cancers. BRCA-related cancers include breast, ovarian, tubal, and peritoneal cancers. Having family members with these cancers may be associated with an increased risk for harmful changes (mutations) in the breast cancer genes BRCA1 and BRCA2. Results of the assessment will determine the need for genetic counseling and BRCA1 and BRCA2 testing.  Routine pelvic exams to screen for cancer are no longer recommended for nonpregnant women who are considered low risk for cancer of the pelvic organs (ovaries, uterus, and vagina) and who do not have symptoms. Ask your health care provider if a screening pelvic exam is right for you.  If you have had past treatment for cervical cancer or a condition that could lead to cancer, you need Pap tests and screening for cancer for at least 20 years after your treatment. If Pap tests have been discontinued, your risk factors (such as having a  new sexual partner) need to be reassessed to determine if screening should be resumed. Some women have medical problems that increase the chance of getting cervical cancer. In these cases, your health care provider may recommend more frequent screening and Pap tests.  The HPV test is an additional test that may be used for cervical cancer screening. The HPV test looks for the virus that can cause the cell changes on the cervix. The cells collected during the Pap test can be tested for HPV. The HPV test could be used to screen women aged 8 years and older, and should be used in women of any age who have unclear Pap test results. After the age of 91, women should have HPV testing at the same frequency as a Pap test.  Colorectal cancer can be detected and often prevented. Most routine colorectal cancer screening begins at the age of 10 years and continues through age 95 years. However, your health care provider may recommend screening at an earlier age if you  have risk factors for colon cancer. On a yearly basis, your health care provider may provide home test kits to check for hidden blood in the stool. Use of a small camera at the end of a tube, to directly examine the colon (sigmoidoscopy or colonoscopy), can detect the earliest forms of colorectal cancer. Talk to your health care provider about this at age 66, when routine screening begins. Direct exam of the colon should be repeated every 5-10 years through age 73 years, unless early forms of pre-cancerous polyps or small growths are found.  People who are at an increased risk for hepatitis B should be screened for this virus. You are considered at high risk for hepatitis B if:  You were born in a country where hepatitis B occurs often. Talk with your health care provider about which countries are considered high risk.  Your parents were born in a high-risk country and you have not received a shot to protect against hepatitis B (hepatitis B vaccine).  You have HIV or AIDS.  You use needles to inject street drugs.  You live with, or have sex with, someone who has hepatitis B.  You get hemodialysis treatment.  You take certain medicines for conditions like cancer, organ transplantation, and autoimmune conditions.  Hepatitis C blood testing is recommended for all people born from [birth year] through 1965 and any individual with known risks for hepatitis C.  Practice safe sex. Use condoms and avoid high-risk sexual practices to reduce the spread of sexually transmitted infections (STIs). STIs include gonorrhea, chlamydia, syphilis, trichomonas, herpes, HPV, and human immunodeficiency virus (HIV). Herpes, HIV, and HPV are viral illnesses that have no cure. They can result in disability, cancer, and death.  You should be screened for sexually transmitted illnesses (STIs) including gonorrhea and chlamydia if:  You are sexually active and are younger than 24 years.  You are older than 24 years and your  health care provider tells you that you are at risk for this type of infection.  Your sexual activity has changed since you were last screened and you are at an increased risk for chlamydia or gonorrhea. Ask your health care provider if you are at risk.  If you are at risk of being infected with HIV, it is recommended that you take a prescription medicine daily to prevent HIV infection. This is called preexposure prophylaxis (PrEP). You are considered at risk if:  You are a heterosexual woman, are sexually active, and are at  increased risk for HIV infection.  You take drugs by injection.  You are sexually active with a partner who has HIV.  Talk with your health care provider about whether you are at high risk of being infected with HIV. If you choose to begin PrEP, you should first be tested for HIV. You should then be tested every 3 months for as long as you are taking PrEP.  Osteoporosis is a disease in which the bones lose minerals and strength with aging. This can result in serious bone fractures or breaks. The risk of osteoporosis can be identified using a bone density scan. Women ages 62 years and over and women at risk for fractures or osteoporosis should discuss screening with their health care providers. Ask your health care provider whether you should take a calcium supplement or vitamin D to reduce the rate of osteoporosis.  Menopause can be associated with physical symptoms and risks. Hormone replacement therapy is available to decrease symptoms and risks. You should talk to your health care provider about whether hormone replacement therapy is right for you.  Use sunscreen. Apply sunscreen liberally and repeatedly throughout the day. You should seek shade when your shadow is shorter than you. Protect yourself by wearing long sleeves, pants, a wide-brimmed hat, and sunglasses year round, whenever you are outdoors.  Once a month, do a whole body skin exam, using a mirror to look at  the skin on your back. Tell your health care provider of new moles, moles that have irregular borders, moles that are larger than a pencil eraser, or moles that have changed in shape or color.  Stay current with required vaccines (immunizations).  Influenza vaccine. All adults should be immunized every year.  Tetanus, diphtheria, and acellular pertussis (Td, Tdap) vaccine. Pregnant women should receive 1 dose of Tdap vaccine during each pregnancy. The dose should be obtained regardless of the length of time since the last dose. Immunization is preferred during the 27th-36th week of gestation. An adult who has not previously received Tdap or who does not know her vaccine status should receive 1 dose of Tdap. This initial dose should be followed by tetanus and diphtheria toxoids (Td) booster doses every 10 years. Adults with an unknown or incomplete history of completing a 3-dose immunization series with Td-containing vaccines should begin or complete a primary immunization series including a Tdap dose. Adults should receive a Td booster every 10 years.  Varicella vaccine. An adult without evidence of immunity to varicella should receive 2 doses or a second dose if she has previously received 1 dose. Pregnant females who do not have evidence of immunity should receive the first dose after pregnancy. This first dose should be obtained before leaving the health care facility. The second dose should be obtained 4-8 weeks after the first dose.  Human papillomavirus (HPV) vaccine. Females aged 13-26 years who have not received the vaccine previously should obtain the 3-dose series. The vaccine is not recommended for use in pregnant females. However, pregnancy testing is not needed before receiving a dose. If a female is found to be pregnant after receiving a dose, no treatment is needed. In that case, the remaining doses should be delayed until after the pregnancy. Immunization is recommended for any person with  an immunocompromised condition through the age of 42 years if she did not get any or all doses earlier. During the 3-dose series, the second dose should be obtained 4-8 weeks after the first dose. The third dose should be  obtained 24 weeks after the first dose and 16 weeks after the second dose.  Zoster vaccine. One dose is recommended for adults aged 75 years or older unless certain conditions are present.  Measles, mumps, and rubella (MMR) vaccine. Adults born before [birth year] generally are considered immune to measles and mumps. Adults born in [birth year] or later should have 1 or more doses of MMR vaccine unless there is a contraindication to the vaccine or there is laboratory evidence of immunity to each of the three diseases. A routine second dose of MMR vaccine should be obtained at least 28 days after the first dose for students attending postsecondary schools, health care workers, or international travelers. People who received inactivated measles vaccine or an unknown type of measles vaccine during 1963-1967 should receive 2 doses of MMR vaccine. People who received inactivated mumps vaccine or an unknown type of mumps vaccine before 1979 and are at high risk for mumps infection should consider immunization with 2 doses of MMR vaccine. For females of childbearing age, rubella immunity should be determined. If there is no evidence of immunity, females who are not pregnant should be vaccinated. If there is no evidence of immunity, females who are pregnant should delay immunization until after pregnancy. Unvaccinated health care workers born before [birth year] who lack laboratory evidence of measles, mumps, or rubella immunity or laboratory confirmation of disease should consider measles and mumps immunization with 2 doses of MMR vaccine or rubella immunization with 1 dose of MMR vaccine.  Pneumococcal 13-valent conjugate (PCV13) vaccine. When indicated, a person who is uncertain of her immunization history and has no  record of immunization should receive the PCV13 vaccine. An adult aged 55 years or older who has certain medical conditions and has not been previously immunized should receive 1 dose of PCV13 vaccine. This PCV13 should be followed with a dose of pneumococcal polysaccharide (PPSV23) vaccine. The PPSV23 vaccine dose should be obtained at least 8 weeks after the dose of PCV13 vaccine. An adult aged 19 years or older who has certain medical conditions and previously received 1 or more doses of PPSV23 vaccine should receive 1 dose of PCV13. The PCV13 vaccine dose should be obtained 1 or more years after the last PPSV23 vaccine dose.  Pneumococcal polysaccharide (PPSV23) vaccine. When PCV13 is also indicated, PCV13 should be obtained first. All adults aged 74 years and older should be immunized. An adult younger than age 85 years who has certain medical conditions should be immunized. Any person who resides in a nursing home or long-term care facility should be immunized. An adult smoker should be immunized. People with an immunocompromised condition and certain other conditions should receive both PCV13 and PPSV23 vaccines. People with human immunodeficiency virus (HIV) infection should be immunized as soon as possible after diagnosis. Immunization during chemotherapy or radiation therapy should be avoided. Routine use of PPSV23 vaccine is not recommended for American Indians, [address], or people younger than 65 years unless there are medical conditions that require PPSV23 vaccine. When indicated, people who have unknown immunization and have no record of immunization should receive PPSV23 vaccine. One-time revaccination 5 years after the first dose of PPSV23 is recommended for people aged 19-64 years who have chronic kidney failure, nephrotic syndrome, asplenia, or immunocompromised conditions. People who received 1-2 doses of PPSV23 before age 74 years should receive another dose of PPSV23 vaccine at age 102  years or later if at least 5 years have passed since the previous dose. Doses of PPSV23 are  not needed for people immunized with PPSV23 at or after age 24 years.  Meningococcal vaccine. Adults with asplenia or persistent complement component deficiencies should receive 2 doses of quadrivalent meningococcal conjugate (MenACWY-D) vaccine. The doses should be obtained at least 2 months apart. Microbiologists working with certain meningococcal bacteria, [employer] recruits, people at risk during an outbreak, and people who travel to or live in countries with a high rate of meningitis should be immunized. A first-year college student up through age 49 years who is living in a residence hall should receive a dose if she did not receive a dose on or after her 16th birthday. Adults who have certain high-risk conditions should receive one or more doses of vaccine.  Hepatitis A vaccine. Adults who wish to be protected from this disease, have certain high-risk conditions, work with hepatitis A-infected animals, work in hepatitis A research labs, or travel to or work in countries with a high rate of hepatitis A should be immunized. Adults who were previously unvaccinated and who anticipate close contact with an international adoptee during the first 60 days after arrival in the [place] from a country with a high rate of hepatitis A should be immunized.  Hepatitis B vaccine. Adults who wish to be protected from this disease, have certain high-risk conditions, may be exposed to blood or other infectious body fluids, are household contacts or sex partners of hepatitis B positive people, are clients or workers in certain care facilities, or travel to or work in countries with a high rate of hepatitis B should be immunized.  Haemophilus influenzae type b (Hib) vaccine. A previously unvaccinated person with asplenia or sickle cell disease or having a scheduled splenectomy should receive 1 dose of Hib vaccine. Regardless  of previous immunization, a recipient of a hematopoietic stem cell transplant should receive a 3-dose series 6-12 months after her successful transplant. Hib vaccine is not recommended for adults with HIV infection. Preventive Services / Frequency Ages 60 to 80 years  Blood pressure check.** / Every 1 to 2 years.  Lipid and cholesterol check.** / Every 5 years beginning at age 42.  Clinical breast exam.** / Every 3 years for women in their 91s and 4s.  BRCA-related cancer risk assessment.** / For women who have family members with a BRCA-related cancer (breast, ovarian, tubal, or peritoneal cancers).  Pap test.** / Every 2 years from ages 68 through 5. Every 3 years starting at age 64 through age 56 or 60 with a history of 3 consecutive normal Pap tests.  HPV screening.** / Every 3 years from ages 69 through ages 39 to 35 with a history of 3 consecutive normal Pap tests.  Hepatitis C blood test.** / For any individual with known risks for hepatitis C.  Skin self-exam. / Monthly.  Influenza vaccine. / Every year.  Tetanus, diphtheria, and acellular pertussis (Tdap, Td) vaccine.** / Consult your health care provider. Pregnant women should receive 1 dose of Tdap vaccine during each pregnancy. 1 dose of Td every 10 years.  Varicella vaccine.** / Consult your health care provider. Pregnant females who do not have evidence of immunity should receive the first dose after pregnancy.  HPV vaccine. / 3 doses over 6 months, if 71 and younger. The vaccine is not recommended for use in pregnant females. However, pregnancy testing is not needed before receiving a dose.  Measles, mumps, rubella (MMR) vaccine.** / You need at least 1 dose of MMR if you were born in 1957 or later. You  may also need a 2nd dose. For females of childbearing age, rubella immunity should be determined. If there is no evidence of immunity, females who are not pregnant should be vaccinated. If there is no evidence of immunity,  females who are pregnant should delay immunization until after pregnancy.  Pneumococcal 13-valent conjugate (PCV13) vaccine.** / Consult your health care provider.  Pneumococcal polysaccharide (PPSV23) vaccine.** / 1 to 2 doses if you smoke cigarettes or if you have certain conditions.  Meningococcal vaccine.** / 1 dose if you are age 54 to 3 years and a [employer] researcher living in a residence hall, or have one of several medical conditions, you need to get vaccinated against meningococcal disease. You may also need additional booster doses.  Hepatitis A vaccine.** / Consult your health care provider.  Hepatitis B vaccine.** / Consult your health care provider.  Haemophilus influenzae type b (Hib) vaccine.** / Consult your health care provider. Ages 71 to 33 years  Blood pressure check.** / Every 1 to 2 years.  Lipid and cholesterol check.** / Every 5 years beginning at age 72 years.  Lung cancer screening. / Every year if you are aged 27-80 years and have a 30-pack-year history of smoking and currently smoke or have quit within the past 15 years. Yearly screening is stopped once you have quit smoking for at least 15 years or develop a health problem that would prevent you from having lung cancer treatment.  Clinical breast exam.** / Every year after age 73 years.  BRCA-related cancer risk assessment.** / For women who have family members with a BRCA-related cancer (breast, ovarian, tubal, or peritoneal cancers).  Mammogram.** / Every year beginning at age 6 years and continuing for as long as you are in good health. Consult with your health care provider.  Pap test.** / Every 3 years starting at age 37 years through age 63 or 3 years with a history of 3 consecutive normal Pap tests.  HPV screening.** / Every 3 years from ages 42 years through ages 49 to 96 years with a history of 3 consecutive normal Pap tests.  Fecal occult blood test (FOBT) of stool. / Every year  beginning at age 14 years and continuing until age 66 years. You may not need to do this test if you get a colonoscopy every 10 years.  Flexible sigmoidoscopy or colonoscopy.** / Every 5 years for a flexible sigmoidoscopy or every 10 years for a colonoscopy beginning at age 74 years and continuing until age 49 years.  Hepatitis C blood test.** / For all people born from [birth year] through 1965 and any individual with known risks for hepatitis C.  Skin self-exam. / Monthly.  Influenza vaccine. / Every year.  Tetanus, diphtheria, and acellular pertussis (Tdap/Td) vaccine.** / Consult your health care provider. Pregnant women should receive 1 dose of Tdap vaccine during each pregnancy. 1 dose of Td every 10 years.  Varicella vaccine.** / Consult your health care provider. Pregnant females who do not have evidence of immunity should receive the first dose after pregnancy.  Zoster vaccine.** / 1 dose for adults aged 35 years or older.  Measles, mumps, rubella (MMR) vaccine.** / You need at least 1 dose of MMR if you were born in 1957 or later. You may also need a 2nd dose. For females of childbearing age, rubella immunity should be determined. If there is no evidence of immunity, females who are not pregnant should be vaccinated. If there is no evidence of immunity, females who  are pregnant should delay immunization until after pregnancy.  Pneumococcal 13-valent conjugate (PCV13) vaccine.** / Consult your health care provider.  Pneumococcal polysaccharide (PPSV23) vaccine.** / 1 to 2 doses if you smoke cigarettes or if you have certain conditions.  Meningococcal vaccine.** / Consult your health care provider.  Hepatitis A vaccine.** / Consult your health care provider.  Hepatitis B vaccine.** / Consult your health care provider.  Haemophilus influenzae type b (Hib) vaccine.** / Consult your health care provider. Ages 65 years and over  Blood pressure check.** / Every 1 to 2 years.  Lipid and  cholesterol check.** / Every 5 years beginning at age 4 years.  Lung cancer screening. / Every year if you are aged 55-80 years and have a 30-pack-year history of smoking and currently smoke or have quit within the past 15 years. Yearly screening is stopped once you have quit smoking for at least 15 years or develop a health problem that would prevent you from having lung cancer treatment.  Clinical breast exam.** / Every year after age 77 years.  BRCA-related cancer risk assessment.** / For women who have family members with a BRCA-related cancer (breast, ovarian, tubal, or peritoneal cancers).  Mammogram.** / Every year beginning at age 73 years and continuing for as long as you are in good health. Consult with your health care provider.  Pap test.** / Every 3 years starting at age 71 years through age 60 or 46 years with 3 consecutive normal Pap tests. Testing can be stopped between 65 and 70 years with 3 consecutive normal Pap tests and no abnormal Pap or HPV tests in the past 10 years.  HPV screening.** / Every 3 years from ages 72 years through ages 79 or 48 years with a history of 3 consecutive normal Pap tests. Testing can be stopped between 65 and 70 years with 3 consecutive normal Pap tests and no abnormal Pap or HPV tests in the past 10 years.  Fecal occult blood test (FOBT) of stool. / Every year beginning at age 65 years and continuing until age 38 years. You may not need to do this test if you get a colonoscopy every 10 years.  Flexible sigmoidoscopy or colonoscopy.** / Every 5 years for a flexible sigmoidoscopy or every 10 years for a colonoscopy beginning at age 36 years and continuing until age 76 years.  Hepatitis C blood test.** / For all people born from [birth year] through 1965 and any individual with known risks for hepatitis C.  Osteoporosis screening.** / A one-time screening for women ages 65 years and over and women at risk for fractures or osteoporosis.  Skin self-exam. /  Monthly.  Influenza vaccine. / Every year.  Tetanus, diphtheria, and acellular pertussis (Tdap/Td) vaccine.** / 1 dose of Td every 10 years.  Varicella vaccine.** / Consult your health care provider.  Zoster vaccine.** / 1 dose for adults aged 23 years or older.  Pneumococcal 13-valent conjugate (PCV13) vaccine.** / Consult your health care provider.  Pneumococcal polysaccharide (PPSV23) vaccine.** / 1 dose for all adults aged 7 years and older.  Meningococcal vaccine.** / Consult your health care provider.  Hepatitis A vaccine.** / Consult your health care provider.  Hepatitis B vaccine.** / Consult your health care provider.  Haemophilus influenzae type b (Hib) vaccine.** / Consult your health care provider. ** Family history and personal history of risk and conditions may change your health care provider's recommendations. Document Released: 10/09/2001 Document Revised: 12/28/2013 Document Reviewed: 01/08/2011 ExitCare Patient Information 2015 [employer]. This information is not intended to replace advice given to you by your health care provider. Make sure you discuss any questions you have with your health care provider.  

## 2014-09-28 NOTE — Assessment & Plan Note (Signed)
Ua done today as well as cbc, cmp, tsh, and lipid panel.

## 2014-09-28 NOTE — Progress Notes (Signed)
Pre visit review using our clinic review tool, if applicable. No additional management support is needed unless otherwise documented below in the visit note. 

## 2015-01-07 ENCOUNTER — Encounter: Payer: Self-pay | Admitting: Medical

## 2015-01-07 ENCOUNTER — Ambulatory Visit (INDEPENDENT_AMBULATORY_CARE_PROVIDER_SITE_OTHER): Payer: 59 | Admitting: Medical

## 2015-01-07 VITALS — BP 121/76 | HR 89 | Temp 98.4°F | Ht 69.0 in | Wt 142.8 lb

## 2015-01-07 DIAGNOSIS — R319 Hematuria, unspecified: Secondary | ICD-10-CM

## 2015-01-07 DIAGNOSIS — N390 Urinary tract infection, site not specified: Secondary | ICD-10-CM

## 2015-01-07 DIAGNOSIS — R350 Frequency of micturition: Secondary | ICD-10-CM | POA: Diagnosis not present

## 2015-01-07 LAB — POCT URINALYSIS DIPSTICK
Bilirubin, UA: NEGATIVE
Blood, UA: POSITIVE
Glucose, UA: NEGATIVE
Ketones, UA: NEGATIVE
Nitrite, UA: NEGATIVE
Protein, UA: 15
Spec Grav, UA: 1.01
Urobilinogen, UA: 0.2
pH, UA: 6

## 2015-01-07 MED ORDER — CEPHALEXIN 500 MG PO CAPS: 500.0000 mg | ORAL_CAPSULE | Freq: Two times a day (BID) | ORAL | Status: DC

## 2015-01-07 NOTE — Progress Notes (Signed)
Subjective:    Patient ID: Karen Powers, female    DOB: 28-Sep-1980, 34 y.o.   MRN: [id number]  HPI  Pt in today reporting urinary symptoms for one day.  Dysuria- pain on urination. Frequent urination-yes Hesitancy-no Suprapubic pressure-yes. Fever-no chills-no Nausea-no Vomiting-no CVA pain-no direct cva. Some mild lower back pain. But hx of. History of UTI-yes. Ever since MS dx. Some retention of urine and does not go. Gross hematuria-no.   Review of Systems  Constitutional: Positive for fatigue. Negative for fever and chills.       Faint.  Respiratory: Negative for cough, choking, chest tightness, shortness of breath and wheezing.   Cardiovascular: Negative for chest pain and palpitations.  Gastrointestinal: Negative for abdominal pain.  Genitourinary: Positive for dysuria, urgency and frequency. Negative for vaginal bleeding, difficulty urinating, vaginal pain and pelvic pain.       Bad odor to urine.  Musculoskeletal: Positive for back pain.  Skin: Negative for rash.  Neurological: Negative for dizziness, syncope, facial asymmetry, speech difficulty, weakness and light-headedness.  Hematological: Negative for adenopathy. Does not bruise/bleed easily.  Psychiatric/Behavioral: Negative for behavioral problems and confusion.   Past Medical History  Diagnosis Date  . SVT (supraventricular tachycardia)     No problems since 10/2012 - no med needed  . MS (multiple sclerosis)   . Heartburn in pregnancy   . Headache(784.0)     otc med prn  . Neuromuscular disorder     MS  . Diabetes mellitus without complication     Gestational diabetes only.    History   Social History  . Marital Status: Single    Spouse Name: N/A  . Number of Children: N/A  . Years of Education: N/A   Occupational History  . Not on file.   Social History Main Topics  . Smoking status: Never Smoker   . Smokeless tobacco: Never Used  . Alcohol Use: No     Comment: occasional  . Drug  Use: No  . Sexual Activity: Yes    Birth Control/ Protection: None     Comment: pregnant   Other Topics Concern  . Not on file   Social History Narrative   ** Merged History Encounter **        Past Surgical History  Procedure Laterality Date  . Atrial ablation surgery  2003    FAILED  . Cesarean section  2006    Breech  . Cesarean section  2006    breech  . Wisdom tooth extraction    . Cesarean section N/A 12/19/2013    Procedure: REPEAT CESAREAN SECTION;  Surgeon: Lovenia Kim, MD;  Location: [facility];  Service: Obstetrics;  Laterality: N/A;  EDD: 12/25/13    Family History  Problem Relation Age of Onset  . Multiple sclerosis Mother   . Emphysema Father     Allergies  Allergen Reactions  . Ciprofloxacin Rash    Current Outpatient Prescriptions on File Prior to Visit  Medication Sig Dispense Refill  . acetaminophen (TYLENOL) 325 MG tablet Take 975 mg by mouth every 6 (six) hours as needed for pain.    . diphenhydrAMINE-zinc acetate (BENADRYL) cream Apply topically daily as needed for itching.    Marland Kitchen ibuprofen (ADVIL,MOTRIN) 600 MG tablet Take 1 tablet (600 mg total) by mouth every 6 (six) hours. (Patient not taking: Reported on 01/07/2015) 30 tablet 1  . oxyCODONE-acetaminophen (PERCOCET/ROXICET) 5-325 MG per tablet Take 1-2 tablets by mouth every 4 (four) hours as needed for  severe pain (moderate - severe pain). (Patient not taking: Reported on 01/07/2015) 30 tablet 0  . Prenatal Vit-Fe Fumarate-FA (PRENATAL MULTIVITAMIN) TABS tablet Take 1 tablet by mouth daily at 12 noon.    . ranitidine (ZANTAC) 150 MG tablet Take 150 mg by mouth 2 (two) times daily as needed for heartburn.     No current facility-administered medications on file prior to visit.    BP 121/76 mmHg  Pulse 89  Temp(Src) 98.4 F (36.9 C) (Oral)  Ht 5\' 9"  (1.753 m)  Wt 142 lb 12.8 oz (64.774 kg)  BMI 21.08 kg/m2  SpO2 98%  LMP 12/11/2014  Breastfeeding? Yes      Objective:   Physical  Exam  General Appearance- Not in acute distress.  HEENT Eyes- Scleraeral/Conjuntiva-bilat- Not Yellow. Mouth & Throat- Normal.  Chest and Lung Exam Auscultation: Breath sounds:-Normal. Adventitious sounds:- No Adventitious sounds.  Cardiovascular Auscultation:Rythm - Regular. Heart Sounds -Normal heart sounds.  Abdomen Inspection:-Inspection Normal.  Palpation/Perucssion: Palpation and Percussion of the abdomen reveal- Faint suprapubic Tender, No Rebound tenderness, No rigidity(Guarding) and No Palpable abdominal masses.  Liver:-Normal.  Spleen:- Normal.   Back- no cva pain on palpation.        Assessment & Plan:

## 2015-01-07 NOTE — Addendum Note (Signed)
Addended by: Bunnie Domino on: 01/07/2015 10:38 AM   Modules accepted: Orders

## 2015-01-07 NOTE — Assessment & Plan Note (Addendum)
Your appear to have a urinary tract infection. I am prescribing cephalexin antibiotic for the probable infection. Hydrate well. I am sending out a urine culture. During the interim if your signs and symptoms worsen rather than improving please notify us. We will notify your when the culture results are back.  Follow up in 7 days or as needed.  Pt is breast feeding so decided to use cephalexin.

## 2015-01-07 NOTE — Progress Notes (Signed)
Pre visit review using our clinic review tool, if applicable. No additional management support is needed unless otherwise documented below in the visit note. 

## 2015-01-07 NOTE — Patient Instructions (Signed)
.  UTI (urinary tract infection) Your appear to have a urinary tract infection. I am prescribing cephalexin antibiotic for the probable infection. Hydrate well. I am sending out a urine culture. During the interim if your signs and symptoms worsen rather than improving please notify us. We will notify your when the culture results are back.  Follow up in 7 days or as needed.

## 2015-01-09 LAB — URINE CULTURE: Colony Count: >=100000

## 2015-04-06 ENCOUNTER — Telehealth: Payer: Self-pay | Admitting: *Deleted

## 2015-04-06 NOTE — Telephone Encounter (Signed)
Pt dropped off forms needed for [facility]. Called and informed pt that we need a copy of her immunizations, fax number was given to her. Awaiting these forms to complete paperwork. JG/CMA

## 2015-04-11 NOTE — Telephone Encounter (Signed)
Received immunization record via fax from [facility]. Forms completed as much as possible and forwarded to [facility]. JG//CMA

## 2015-04-12 NOTE — Telephone Encounter (Signed)
Pt needs to schedule a nurse visit for a TB test and vision screen. She also needs to provide [place] with her childhood immunizations. Called and left detailed message for pt. Please schedule nurse visit when she returns call.

## 2015-04-18 ENCOUNTER — Telehealth: Payer: Self-pay | Admitting: Medical

## 2015-04-18 NOTE — Telephone Encounter (Signed)
I definitely remember filling out the forms and giving to Janett Billow to call pt since there were questions I had on her ppd and vaccine page. Please discuss with Janett Billow tomorrow. Let me know what she says.

## 2015-04-18 NOTE — Telephone Encounter (Signed)
Caller name:Karen Powers Relationship to patient: Self  Can be reached: [phone]  Pharmacy:  Reason for call: pt is on her way here to pick up forms. Informed pt to wait for call back for paperwork completion.

## 2015-04-18 NOTE — Telephone Encounter (Signed)
Error

## 2015-04-19 ENCOUNTER — Ambulatory Visit: Payer: 59

## 2015-04-19 NOTE — Telephone Encounter (Signed)
Forms given to Santiago Glad. Pt needs a nurse visit

## 2015-04-19 NOTE — Telephone Encounter (Signed)
Please refer to phone note from 04/06/2015. I called pt because she needs a nurse visit. I have given the paperwork to Barnet Pall to give to Santiago Glad.

## 2015-04-19 NOTE — Telephone Encounter (Signed)
Called patient,states she will get PPD from her place of employment [employer]) advised she could attach a copy of her immunizations and titers with her aplication,patient agreed. States she will come by today to pick up paperwork.

## 2015-06-15 LAB — OB RESULTS CONSOLE GC/CHLAMYDIA
Chlamydia: NEGATIVE
Gonorrhea: NEGATIVE

## 2015-06-15 LAB — OB RESULTS CONSOLE RPR: RPR: NONREACTIVE

## 2015-06-15 LAB — OB RESULTS CONSOLE ABO/RH: RH Type: NEGATIVE

## 2015-06-15 LAB — OB RESULTS CONSOLE HIV ANTIBODY (ROUTINE TESTING): HIV: NONREACTIVE

## 2015-06-15 LAB — OB RESULTS CONSOLE HEPATITIS B SURFACE ANTIGEN: Hepatitis B Surface Ag: NEGATIVE

## 2015-06-15 LAB — OB RESULTS CONSOLE ANTIBODY SCREEN: Antibody Screen: NEGATIVE

## 2015-08-18 ENCOUNTER — Encounter: Payer: Self-pay | Admitting: Internal Medicine

## 2015-09-18 ENCOUNTER — Inpatient Hospital Stay (HOSPITAL_COMMUNITY)
Admission: AD | Admit: 2015-09-18 | Discharge: 2015-09-18 | Disposition: A | Discharge status: Home / Self Care | Payer: 59 | Source: Ambulatory Visit | Attending: Obstetrics and Gynecology | Admitting: Obstetrics and Gynecology

## 2015-09-18 ENCOUNTER — Encounter (HOSPITAL_COMMUNITY): Payer: Self-pay | Admitting: *Deleted

## 2015-09-18 DIAGNOSIS — Z3A22 22 weeks gestation of pregnancy: Secondary | ICD-10-CM | POA: Insufficient documentation

## 2015-09-18 DIAGNOSIS — E119 Type 2 diabetes mellitus without complications: Secondary | ICD-10-CM | POA: Diagnosis not present

## 2015-09-18 DIAGNOSIS — O26892 Other specified pregnancy related conditions, second trimester: Secondary | ICD-10-CM | POA: Insufficient documentation

## 2015-09-18 DIAGNOSIS — O26899 Other specified pregnancy related conditions, unspecified trimester: Secondary | ICD-10-CM

## 2015-09-18 DIAGNOSIS — R109 Unspecified abdominal pain: Secondary | ICD-10-CM | POA: Diagnosis not present

## 2015-09-18 DIAGNOSIS — G35 Multiple sclerosis: Secondary | ICD-10-CM | POA: Diagnosis not present

## 2015-09-18 HISTORY — DX: Anemia, unspecified: D64.9

## 2015-09-18 LAB — URINALYSIS, ROUTINE W REFLEX MICROSCOPIC
Bilirubin Urine: NEGATIVE
Glucose, UA: NEGATIVE mg/dL
Hgb urine dipstick: NEGATIVE
Ketones, ur: NEGATIVE mg/dL
Leukocytes, UA: NEGATIVE
Nitrite: NEGATIVE
Protein, ur: NEGATIVE mg/dL
Specific Gravity, Urine: 1.01 (ref 1.005–1.030)
pH: 5.5 (ref 5.0–8.0)

## 2015-09-18 LAB — WET PREP, GENITAL
Clue Cells Wet Prep HPF POC: NONE SEEN
Sperm: NONE SEEN
Trich, Wet Prep: NONE SEEN
Yeast Wet Prep HPF POC: NONE SEEN

## 2015-09-18 NOTE — MAU Provider Note (Signed)
History     CSN: [id number]  Arrival date and time: 09/18/15 [id number]  Provider notified: [phone] Provider on unit: [id number] Provider at bedside: [place]     Chief Complaint  Patient presents with  . Contractions   HPI  [name] is a 35 yo [phone] female at 22.[redacted] wks gestation by LMP, presenting with complaints of abdominal cramping or ?contractions all day yesterday and last night.  She reports that the tightening in her abdomen is not painful, but "just more than 6-7/hour".  She is concerned that the contractions are more than what she should be having. She reports having sexual intercourse at 0930 yesterday morning. Denies UTI sx's, N/V/D, VB or LOF. Her prenatal care is significant for: previous cesarean delivery x 2, c/s wound dehiscence (noted at last c/s), h/o GDM, cardiac arrythmia - no meds. Her primary [facility] provider at [facility] is Dr. Ronita Hipps.  Past Medical History  Diagnosis Date  . SVT (supraventricular tachycardia) ([place])     No problems since 10/2012 - no med needed  . MS (multiple sclerosis) ([place])   . Heartburn in pregnancy   . Headache(784.0)     otc med prn  . Neuromuscular disorder ([place])     MS  . Anemia   . Diabetes mellitus without complication ([place]) 123456    Gestational diabetes only.    Past Surgical History  Procedure Laterality Date  . Atrial ablation surgery  2003    FAILED  . Cesarean section  2006    Breech  . Cesarean section  2006    breech  . Wisdom tooth extraction    . Cesarean section N/A 12/19/2013    Procedure: REPEAT CESAREAN SECTION;  Surgeon: Lovenia Kim, MD;  Location: [facility];  Service: Obstetrics;  Laterality: N/A;  EDD: 12/25/13    Family History  Problem Relation Age of Onset  . Multiple sclerosis Mother   . Emphysema Father     Social History  Substance Use Topics  . Smoking status: Never Smoker   . Smokeless tobacco: Never Used  . Alcohol Use: No     Comment: occasional    Allergies:  Allergies  Allergen Reactions  .  Ciprofloxacin Rash    Prescriptions prior to admission  Medication Sig Dispense Refill Last Dose  . Prenatal Vit-Fe Fumarate-FA (PRENATAL MULTIVITAMIN) TABS tablet Take 1 tablet by mouth daily at 12 noon.   Past Week at Unknown time  . cephALEXin (KEFLEX) 500 MG capsule Take 1 capsule (500 mg total) by mouth 2 (two) times daily. (Patient not taking: Reported on 09/18/2015) 20 capsule 0 Completed Course at Unknown time  . ibuprofen (ADVIL,MOTRIN) 600 MG tablet Take 1 tablet (600 mg total) by mouth every 6 (six) hours. (Patient not taking: Reported on 01/07/2015) 30 tablet 1 Not Taking at Unknown time  . oxyCODONE-acetaminophen (PERCOCET/ROXICET) 5-325 MG per tablet Take 1-2 tablets by mouth every 4 (four) hours as needed for severe pain (moderate - severe pain). (Patient not taking: Reported on 01/07/2015) 30 tablet 0 Not Taking at Unknown time    Review of Systems  Constitutional: Negative.   HENT: Negative.   Eyes: Negative.   Respiratory: Negative.   Cardiovascular: Negative.   Gastrointestinal: Negative.   Genitourinary:       Contractions >6-7 per hour  Musculoskeletal: Negative.   Skin: Negative.   Neurological: Negative.   Endo/Heme/Allergies: Negative.   Psychiatric/Behavioral: Negative.    Results for orders placed or performed during the hospital encounter of 09/18/15 (  from the past 24 hour(s))  Urinalysis, Routine w reflex microscopic (not at [facility])     Status: None   Collection Time: 09/18/15  7:30 AM  Result Value Ref Range   Color, Urine YELLOW YELLOW   APPearance CLEAR CLEAR   Specific Gravity, Urine 1.010 1.005 - 1.030   pH 5.5 5.0 - 8.0   Glucose, UA NEGATIVE NEGATIVE mg/dL   Hgb urine dipstick NEGATIVE NEGATIVE   Bilirubin Urine NEGATIVE NEGATIVE   Ketones, ur NEGATIVE NEGATIVE mg/dL   Protein, ur NEGATIVE NEGATIVE mg/dL   Nitrite NEGATIVE NEGATIVE   Leukocytes, UA NEGATIVE NEGATIVE  Wet prep, genital     Status: Abnormal   Collection Time: 09/18/15  9:31 AM   Result Value Ref Range   Yeast Wet Prep HPF POC NONE SEEN NONE SEEN   Trich, Wet Prep NONE SEEN NONE SEEN   Clue Cells Wet Prep HPF POC NONE SEEN NONE SEEN   WBC, Wet Prep HPF POC MODERATE (A) NONE SEEN   Sperm NONE SEEN    Doppler: FHR: 138 bpm  TOCO: irregular UI, 1-2 UC's noted   Physical Exam   Blood pressure 123/69, pulse 80, temperature 98.3 F (36.8 C), temperature source Oral, resp. rate 18, height 5\' 10"  (1.778 m), weight 66.679 kg (147 lb), last menstrual period 12/11/2014.  Physical Exam  Constitutional: She is oriented to person, place, and time. She appears well-developed and well-nourished.  HENT:  Head: Normocephalic and atraumatic.  Neck: Normal range of motion.  Respiratory: Effort normal.  GI: Soft.  Genitourinary:  Gravid; S=D; Speculum exam: moderate amount of clear to yellow mucous discharge in vagina and coming out of cervical os; wet prep done  Musculoskeletal: Normal range of motion.  Neurological: She is alert and oriented to person, place, and time.  Skin: Skin is warm and dry.  Psychiatric: She has a normal mood and affect. Her behavior is normal. Judgment and thought content normal.    MAU Course  Procedures CCUA FHTs [place] Only Assessment and Plan  35 yo G4P2012 at 22.[redacted] wks gestation Abdominal Pain in Pregnancy, second trimester Previous Cesarean Delivery Cardiac Arrythmia  Discharge Home Someone will call to schedule a F/U appt for re-evaluation by Dr. Ronita Hipps  *Dr. Ronita Hipps notified of assessment and plan - agrees  Graceann Congress MSN, CNM 09/18/2015, 9:15 AM

## 2015-09-18 NOTE — Discharge Instructions (Signed)

## 2015-09-18 NOTE — MAU Note (Signed)
Pt states has been having contractions 6X/hr all night.  States tried to stay hydrated and rest but contractions persist.

## 2015-10-11 DIAGNOSIS — Z36 Encounter for antenatal screening of mother: Secondary | ICD-10-CM | POA: Diagnosis not present

## 2015-10-26 DIAGNOSIS — O360120 Maternal care for anti-D [Rh] antibodies, second trimester, not applicable or unspecified: Secondary | ICD-10-CM | POA: Diagnosis not present

## 2015-10-26 DIAGNOSIS — Z23 Encounter for immunization: Secondary | ICD-10-CM | POA: Diagnosis not present

## 2015-10-26 DIAGNOSIS — Z3A28 28 weeks gestation of pregnancy: Secondary | ICD-10-CM | POA: Diagnosis not present

## 2015-10-26 DIAGNOSIS — Z36 Encounter for antenatal screening of mother: Secondary | ICD-10-CM | POA: Diagnosis not present

## 2015-12-06 DIAGNOSIS — Z3A34 34 weeks gestation of pregnancy: Secondary | ICD-10-CM | POA: Diagnosis not present

## 2015-12-06 DIAGNOSIS — O09293 Supervision of pregnancy with other poor reproductive or obstetric history, third trimester: Secondary | ICD-10-CM | POA: Diagnosis not present

## 2015-12-16 DIAGNOSIS — Z3A36 36 weeks gestation of pregnancy: Secondary | ICD-10-CM | POA: Diagnosis not present

## 2015-12-16 DIAGNOSIS — O09293 Supervision of pregnancy with other poor reproductive or obstetric history, third trimester: Secondary | ICD-10-CM | POA: Diagnosis not present

## 2015-12-16 LAB — OB RESULTS CONSOLE GBS: GBS: NEGATIVE

## 2015-12-19 ENCOUNTER — Other Ambulatory Visit: Payer: Self-pay | Admitting: Obstetrics and Gynecology

## 2015-12-20 DIAGNOSIS — O09293 Supervision of pregnancy with other poor reproductive or obstetric history, third trimester: Secondary | ICD-10-CM | POA: Diagnosis not present

## 2015-12-20 DIAGNOSIS — Z3A36 36 weeks gestation of pregnancy: Secondary | ICD-10-CM | POA: Diagnosis not present

## 2015-12-28 ENCOUNTER — Encounter (HOSPITAL_COMMUNITY): Payer: Self-pay

## 2016-01-04 ENCOUNTER — Encounter (HOSPITAL_COMMUNITY)
Admission: RE | Admit: 2016-01-04 | Discharge: 2016-01-04 | Disposition: A | Discharge status: Home / Self Care | Payer: 59 | Source: Ambulatory Visit | Attending: Obstetrics and Gynecology | Admitting: Obstetrics and Gynecology

## 2016-01-04 ENCOUNTER — Encounter (HOSPITAL_COMMUNITY): Payer: Self-pay | Admitting: Anesthesiology

## 2016-01-04 ENCOUNTER — Encounter (HOSPITAL_COMMUNITY): Payer: Self-pay

## 2016-01-04 DIAGNOSIS — Z833 Family history of diabetes mellitus: Secondary | ICD-10-CM | POA: Diagnosis not present

## 2016-01-04 DIAGNOSIS — Z8249 Family history of ischemic heart disease and other diseases of the circulatory system: Secondary | ICD-10-CM | POA: Diagnosis not present

## 2016-01-04 DIAGNOSIS — O9081 Anemia of the puerperium: Secondary | ICD-10-CM | POA: Diagnosis not present

## 2016-01-04 DIAGNOSIS — O34211 Maternal care for low transverse scar from previous cesarean delivery: Secondary | ICD-10-CM | POA: Diagnosis not present

## 2016-01-04 DIAGNOSIS — O99354 Diseases of the nervous system complicating childbirth: Secondary | ICD-10-CM | POA: Diagnosis not present

## 2016-01-04 DIAGNOSIS — Z825 Family history of asthma and other chronic lower respiratory diseases: Secondary | ICD-10-CM | POA: Diagnosis not present

## 2016-01-04 DIAGNOSIS — G35 Multiple sclerosis: Secondary | ICD-10-CM | POA: Diagnosis not present

## 2016-01-04 DIAGNOSIS — D62 Acute posthemorrhagic anemia: Secondary | ICD-10-CM | POA: Diagnosis not present

## 2016-01-04 DIAGNOSIS — Z820 Family history of epilepsy and other diseases of the nervous system: Secondary | ICD-10-CM | POA: Diagnosis not present

## 2016-01-04 HISTORY — DX: Cardiac arrhythmia, unspecified: I49.9

## 2016-01-04 HISTORY — DX: Gestational diabetes mellitus in pregnancy, unspecified control: O24.419

## 2016-01-04 HISTORY — DX: Unspecified abnormal cytological findings in specimens from vagina: R87.629

## 2016-01-04 HISTORY — DX: Orthostatic hypotension: I95.1

## 2016-01-04 HISTORY — DX: Vitamin deficiency, unspecified: E56.9

## 2016-01-04 HISTORY — DX: Tachycardia, unspecified: R00.0

## 2016-01-04 HISTORY — DX: Postural orthostatic tachycardia syndrome (POTS): G90.A

## 2016-01-04 HISTORY — DX: Other specified cardiac arrhythmias: I49.8

## 2016-01-04 LAB — CBC
HCT: 30.3 % — ABNORMAL LOW (ref 36.0–46.0)
Hemoglobin: 10.6 g/dL — ABNORMAL LOW (ref 12.0–15.0)
MCH: 32.8 pg (ref 26.0–34.0)
MCHC: 35 g/dL (ref 30.0–36.0)
MCV: 93.8 fL (ref 78.0–100.0)
Platelets: 164 10*3/uL (ref 150–400)
RBC: 3.23 MIL/uL — ABNORMAL LOW (ref 3.87–5.11)
RDW: 13.2 % (ref 11.5–15.5)
WBC: 9.6 10*3/uL (ref 4.0–10.5)

## 2016-01-04 NOTE — H&P (Signed)
Karen Powers is a 35 y.o. female presenting for repeat csection.  Maternal Medical History:  Contractions: Onset was less than 1 hour ago.   Perceived severity is mild.    Fetal activity: Perceived fetal activity is normal.   Last perceived fetal movement was within the past hour.    Prenatal complications: no prenatal complications Prenatal Complications - Diabetes: none.    OB History    Gravida Para Term Preterm AB TAB SAB Ectopic Multiple Living   4 2 2  1  1   2      Past Medical History  Diagnosis Date  . SVT (supraventricular tachycardia) ([place])     No problems since 10/2012 - no med needed  . MS (multiple sclerosis) ([place])   . Heartburn in pregnancy   . Headache(784.0)     otc med prn  . Neuromuscular disorder ([place])     MS  . Anemia   . Vaginal Pap smear, abnormal   . Vitamin deficiency   . Dysrhythmia   . POTS (postural orthostatic tachycardia syndrome)   . Diabetes mellitus without complication ([place]) 123456    Gestational diabetes only.  . Gestational diabetes     2nd preg only   Past Surgical History  Procedure Laterality Date  . Atrial ablation surgery  2003    FAILED  . Cesarean section  2006    Breech  . Cesarean section  2006    breech  . Wisdom tooth extraction    . Cesarean section N/A 12/19/2013    Procedure: REPEAT CESAREAN SECTION;  Surgeon: Lovenia Kim, MD;  Location: [facility];  Service: Obstetrics;  Laterality: N/A;  EDD: 12/25/13  . Colposcopy     Family History: family history includes Cancer in her paternal grandfather; Diabetes in her maternal grandmother; Emphysema in her father; Heart disease in her father; Migraines in her father, mother, and sister; Multiple sclerosis in her mother; Spina bifida in her brother. Social History:  reports that she has never smoked. She has never used smokeless tobacco. She reports that she does not drink alcohol or use illicit drugs.   Prenatal Transfer Tool  Maternal Diabetes: No Genetic Screening:  Normal Maternal Ultrasounds/Referrals: Normal Fetal Ultrasounds or other Referrals:  None Maternal Substance Abuse:  No Significant Maternal Medications:  None Significant Maternal Lab Results:  None Other Comments:  None  Review of Systems  Constitutional: Negative.   All other systems reviewed and are negative.     Last menstrual period 12/11/2014, currently breastfeeding. Maternal Exam:  Uterine Assessment: Contraction strength is mild.  Contraction frequency is rare.   Abdomen: Surgical scars: low transverse.   Fetal presentation: vertex  Introitus: Normal vulva. Normal vagina.  Ferning test: not done.  Nitrazine test: not done. Amniotic fluid character: not assessed.  Pelvis: questionable for delivery.   Cervix: Cervix evaluated by digital exam.     Physical Exam  Constitutional: She is oriented to person, place, and time. She appears well-developed and well-nourished.  HENT:  Head: Normocephalic and atraumatic.  Neck: Normal range of motion. Neck supple.  Cardiovascular: Normal rate and regular rhythm.   Respiratory: Effort normal and breath sounds normal.  GI: Soft. Bowel sounds are normal.  Genitourinary: Vagina normal and uterus normal.  Musculoskeletal: Normal range of motion.  Neurological: She is alert and oriented to person, place, and time. She has normal reflexes.  Skin: Skin is warm and dry.  Psychiatric: She has a normal mood and affect.    Prenatal  labs: ABO, Rh: --/--/O NEG (05/10 1015) Antibody: POS (05/10 1015) Rubella:   RPR: Nonreactive (10/19 0000)  HBsAg: Negative (10/19 0000)  HIV: Non-reactive (10/19 0000)  GBS: Negative (04/21 0000)   Assessment/Plan: 39 week IUP Previous csection x 2 Proceed with csection Consent done   [name] J 01/04/2016, 9:47 PM

## 2016-01-04 NOTE — Anesthesia Preprocedure Evaluation (Addendum)
Anesthesia Evaluation  Patient identified by MRN, date of birth, ID band Patient awake    Reviewed: Allergy & Precautions, H&P , NPO status , Patient's Chart, lab work & pertinent test results  Airway Mallampati: I  TM Distance: >3 FB Neck ROM: Full    Dental no notable dental hx.    Pulmonary neg pulmonary ROS,    Pulmonary exam normal        Cardiovascular Normal cardiovascular exam+ dysrhythmias (s/p ablation 2014) Supra Ventricular Tachycardia      Neuro/Psych Multiple sclerosis  Neuromuscular disease negative psych ROS   GI/Hepatic negative GI ROS, Neg liver ROS,   Endo/Other  diabetes  Renal/GU negative Renal ROS  negative genitourinary   Musculoskeletal negative musculoskeletal ROS (+)   Abdominal Normal abdominal exam  (+)   Peds negative pediatric ROS (+)  Hematology   Anesthesia Other Findings   Reproductive/Obstetrics negative OB ROS (+) Pregnancy                            Anesthesia Physical  Anesthesia Plan  ASA: II  Anesthesia Plan: Spinal   Post-op Pain Management:    Induction:   Airway Management Planned:   Additional Equipment:   Intra-op Plan:   Post-operative Plan:   Informed Consent: I have reviewed the patients History and Physical, chart, labs and discussed the procedure including the risks, benefits and alternatives for the proposed anesthesia with the patient or authorized representative who has indicated his/her understanding and acceptance.     Plan Discussed with: CRNA and Surgeon  Anesthesia Plan Comments:        Anesthesia Quick Evaluation

## 2016-01-04 NOTE — Patient Instructions (Signed)
Alice  01/04/2016   Your procedure is scheduled on:  01/05/2016  Enter through the Main Entrance of [facility] at [place] up the phone at the desk and dial [phone].   Call this number if you have problems the morning of surgery: [phone]   Remember:   Do not eat food:After Midnight.  Do not drink clear liquids: After Midnight.  Take these medicines the morning of surgery with A SIP OF WATER: none   Do not wear jewelry, make-up or nail polish.  Do not wear lotions, powders, or perfumes. You may wear deodorant.  Do not shave 48 hours prior to surgery.  Do not bring valuables to the hospital.  [facility] is not   responsible for any belongings or valuables brought to the hospital.  Contacts, dentures or bridgework may not be worn into surgery.  Leave suitcase in the car. After surgery it may be brought to your room.  For patients admitted to the hospital, checkout time is 11:00 AM the day of              discharge.   Patients discharged the day of surgery will not be allowed to drive             home.  Name and phone number of your driver: none  Special Instructions:   Shower using CHG 2 nights before surgery and the night before surgery.  If you shower the day of surgery use CHG.  Use special wash - you have one bottle of CHG for all showers.  You should use approximately 1/3 of the bottle for each shower.   Please read over the following fact sheets that you were given:   Surgical Site Infection Prevention

## 2016-01-05 ENCOUNTER — Encounter (HOSPITAL_COMMUNITY): Payer: Self-pay | Admitting: *Deleted

## 2016-01-05 ENCOUNTER — Inpatient Hospital Stay (HOSPITAL_COMMUNITY): Payer: 59 | Admitting: Anesthesiology

## 2016-01-05 ENCOUNTER — Inpatient Hospital Stay (HOSPITAL_COMMUNITY)
Admission: RE | Admit: 2016-01-05 | Discharge: 2016-01-08 | DRG: 765 | Disposition: A | Discharge status: Home / Self Care | Payer: 59 | Source: Ambulatory Visit | Attending: Obstetrics and Gynecology | Admitting: Obstetrics and Gynecology

## 2016-01-05 ENCOUNTER — Encounter (HOSPITAL_COMMUNITY)
Admission: RE | Disposition: A | Discharge status: Home / Self Care | Payer: Self-pay | Source: Ambulatory Visit | Attending: Obstetrics and Gynecology

## 2016-01-05 DIAGNOSIS — D62 Acute posthemorrhagic anemia: Secondary | ICD-10-CM | POA: Diagnosis present

## 2016-01-05 DIAGNOSIS — O99354 Diseases of the nervous system complicating childbirth: Secondary | ICD-10-CM | POA: Diagnosis present

## 2016-01-05 DIAGNOSIS — Z820 Family history of epilepsy and other diseases of the nervous system: Secondary | ICD-10-CM

## 2016-01-05 DIAGNOSIS — Z3A39 39 weeks gestation of pregnancy: Secondary | ICD-10-CM

## 2016-01-05 DIAGNOSIS — O9081 Anemia of the puerperium: Secondary | ICD-10-CM | POA: Diagnosis present

## 2016-01-05 DIAGNOSIS — G35 Multiple sclerosis: Secondary | ICD-10-CM | POA: Diagnosis present

## 2016-01-05 DIAGNOSIS — Z833 Family history of diabetes mellitus: Secondary | ICD-10-CM

## 2016-01-05 DIAGNOSIS — Z98891 History of uterine scar from previous surgery: Secondary | ICD-10-CM

## 2016-01-05 DIAGNOSIS — O34211 Maternal care for low transverse scar from previous cesarean delivery: Secondary | ICD-10-CM | POA: Diagnosis not present

## 2016-01-05 DIAGNOSIS — O34219 Maternal care for unspecified type scar from previous cesarean delivery: Secondary | ICD-10-CM | POA: Diagnosis not present

## 2016-01-05 DIAGNOSIS — Z825 Family history of asthma and other chronic lower respiratory diseases: Secondary | ICD-10-CM

## 2016-01-05 DIAGNOSIS — Z8249 Family history of ischemic heart disease and other diseases of the circulatory system: Secondary | ICD-10-CM

## 2016-01-05 DIAGNOSIS — IMO0001 Reserved for inherently not codable concepts without codable children: Secondary | ICD-10-CM | POA: Diagnosis present

## 2016-01-05 DIAGNOSIS — D509 Iron deficiency anemia, unspecified: Secondary | ICD-10-CM | POA: Diagnosis present

## 2016-01-05 HISTORY — DX: Reserved for inherently not codable concepts without codable children: IMO0001

## 2016-01-05 HISTORY — DX: Acute posthemorrhagic anemia: D62

## 2016-01-05 LAB — BASIC METABOLIC PANEL
Anion gap: 9 (ref 5–15)
BUN: 7 mg/dL (ref 6–20)
CO2: 20 mmol/L — ABNORMAL LOW (ref 22–32)
Calcium: 8.3 mg/dL — ABNORMAL LOW (ref 8.9–10.3)
Chloride: 107 mmol/L (ref 101–111)
Creatinine, Ser: 0.54 mg/dL (ref 0.44–1.00)
GFR calc Af Amer: >60 mL/min (ref >60)
GFR calc non Af Amer: >60 mL/min (ref >60)
Glucose, Bld: 84 mg/dL (ref 65–99)
Potassium: 3.5 mmol/L (ref 3.5–5.1)
Sodium: 136 mmol/L (ref 135–145)

## 2016-01-05 LAB — RPR: RPR Ser Ql: NONREACTIVE

## 2016-01-05 SURGERY — Surgical Case
Anesthesia: Spinal

## 2016-01-05 MED ORDER — NALBUPHINE HCL 10 MG/ML IJ SOLN
5.0000 mg | INTRAMUSCULAR | Status: DC | PRN
  Administered 2016-01-05: 5 mg via INTRAVENOUS

## 2016-01-05 MED ORDER — METHYLERGONOVINE MALEATE 0.2 MG PO TABS: 0.2000 mg | ORAL_TABLET | ORAL | Status: DC | PRN

## 2016-01-05 MED ORDER — DIPHENHYDRAMINE HCL 25 MG PO CAPS: 25.0000 mg | ORAL_CAPSULE | ORAL | Status: DC | PRN

## 2016-01-05 MED ORDER — OXYTOCIN 40 UNITS IN LACTATED RINGERS INFUSION - SIMPLE MED: 2.5000 [IU]/h | INTRAVENOUS | Status: AC

## 2016-01-05 MED ORDER — LACTATED RINGERS IV SOLN
INTRAVENOUS | Status: DC
  Administered 2016-01-05: 18:00:00 via INTRAVENOUS

## 2016-01-05 MED ORDER — SIMETHICONE 80 MG PO CHEW
80.0000 mg | CHEWABLE_TABLET | ORAL | Status: DC
Start: 2016-01-06 — End: 2016-01-08
  Administered 2016-01-05 – 2016-01-07 (×3): 80 mg via ORAL
  Filled 2016-01-05 (×3): qty 1

## 2016-01-05 MED ORDER — FENTANYL CITRATE (PF) 100 MCG/2ML IJ SOLN
INTRAMUSCULAR | Status: DC | PRN
  Administered 2016-01-05: 20 ug via INTRATHECAL

## 2016-01-05 MED ORDER — NALOXONE HCL 2 MG/2ML IJ SOSY
1.0000 ug/kg/h | PREFILLED_SYRINGE | INTRAMUSCULAR | Status: DC | PRN
  Filled 2016-01-05: qty 2

## 2016-01-05 MED ORDER — ZOLPIDEM TARTRATE 5 MG PO TABS: 5.0000 mg | ORAL_TABLET | Freq: Every evening | ORAL | Status: DC | PRN

## 2016-01-05 MED ORDER — CEFAZOLIN SODIUM-DEXTROSE 2-3 GM-% IV SOLR
INTRAVENOUS | Status: AC
  Filled 2016-01-05: qty 50

## 2016-01-05 MED ORDER — MORPHINE SULFATE (PF) 0.5 MG/ML IJ SOLN
INTRAMUSCULAR | Status: DC | PRN
  Administered 2016-01-05: .2 mg via INTRATHECAL

## 2016-01-05 MED ORDER — ACETAMINOPHEN 325 MG PO TABS
650.0000 mg | ORAL_TABLET | ORAL | Status: DC | PRN
  Administered 2016-01-07 – 2016-01-08 (×6): 650 mg via ORAL
  Filled 2016-01-05 (×6): qty 2

## 2016-01-05 MED ORDER — OXYTOCIN 10 UNIT/ML IJ SOLN: 2.5000 [IU]/h | INTRAVENOUS | Status: DC

## 2016-01-05 MED ORDER — WITCH HAZEL-GLYCERIN EX PADS: 1.0000 "application " | MEDICATED_PAD | CUTANEOUS | Status: DC | PRN

## 2016-01-05 MED ORDER — ONDANSETRON HCL 4 MG/2ML IJ SOLN: 4.0000 mg | Freq: Three times a day (TID) | INTRAMUSCULAR | Status: DC | PRN

## 2016-01-05 MED ORDER — METHYLERGONOVINE MALEATE 0.2 MG/ML IJ SOLN: 0.2000 mg | INTRAMUSCULAR | Status: DC | PRN

## 2016-01-05 MED ORDER — ACETAMINOPHEN 500 MG PO TABS
1000.0000 mg | ORAL_TABLET | Freq: Four times a day (QID) | ORAL | Status: AC
  Administered 2016-01-05 – 2016-01-06 (×3): 1000 mg via ORAL
  Filled 2016-01-05 (×3): qty 2

## 2016-01-05 MED ORDER — KETOROLAC TROMETHAMINE 30 MG/ML IJ SOLN
30.0000 mg | Freq: Four times a day (QID) | INTRAMUSCULAR | Status: DC | PRN
Start: 2016-01-05 — End: 2016-01-05

## 2016-01-05 MED ORDER — SENNOSIDES-DOCUSATE SODIUM 8.6-50 MG PO TABS
2.0000 | ORAL_TABLET | ORAL | Status: DC
  Administered 2016-01-05 – 2016-01-07 (×3): 2 via ORAL
  Filled 2016-01-05 (×3): qty 2

## 2016-01-05 MED ORDER — MENTHOL 3 MG MT LOZG: 1.0000 | LOZENGE | OROMUCOSAL | Status: DC | PRN

## 2016-01-05 MED ORDER — ONDANSETRON HCL 4 MG/2ML IJ SOLN: 4.0000 mg | Freq: Once | INTRAMUSCULAR | Status: DC | PRN

## 2016-01-05 MED ORDER — MEPERIDINE HCL 25 MG/ML IJ SOLN: 6.2500 mg | INTRAMUSCULAR | Status: DC | PRN

## 2016-01-05 MED ORDER — NALOXONE HCL 0.4 MG/ML IJ SOLN: 0.4000 mg | INTRAMUSCULAR | Status: DC | PRN

## 2016-01-05 MED ORDER — PHENYLEPHRINE 8 MG IN D5W 100 ML (0.08MG/ML) PREMIX OPTIME
INJECTION | INTRAVENOUS | Status: AC
  Filled 2016-01-05: qty 100

## 2016-01-05 MED ORDER — OXYTOCIN 10 UNIT/ML IJ SOLN
INTRAMUSCULAR | Status: AC
  Filled 2016-01-05: qty 4

## 2016-01-05 MED ORDER — DIBUCAINE 1 % RE OINT: 1.0000 "application " | TOPICAL_OINTMENT | RECTAL | Status: DC | PRN

## 2016-01-05 MED ORDER — PHENYLEPHRINE 8 MG IN D5W 100 ML (0.08MG/ML) PREMIX OPTIME
INJECTION | INTRAVENOUS | Status: DC | PRN
  Administered 2016-01-05: 60 ug/min via INTRAVENOUS

## 2016-01-05 MED ORDER — FENTANYL CITRATE (PF) 100 MCG/2ML IJ SOLN
INTRAMUSCULAR | Status: AC
  Filled 2016-01-05: qty 2

## 2016-01-05 MED ORDER — TETANUS-DIPHTH-ACELL PERTUSSIS 5-2.5-18.5 LF-MCG/0.5 IM SUSP: 0.5000 mL | Freq: Once | INTRAMUSCULAR | Status: DC

## 2016-01-05 MED ORDER — LACTATED RINGERS IV SOLN
INTRAVENOUS | Status: DC
  Administered 2016-01-05 (×3): via INTRAVENOUS

## 2016-01-05 MED ORDER — SIMETHICONE 80 MG PO CHEW
80.0000 mg | CHEWABLE_TABLET | Freq: Three times a day (TID) | ORAL | Status: DC
  Administered 2016-01-05 – 2016-01-08 (×7): 80 mg via ORAL
  Filled 2016-01-05 (×8): qty 1

## 2016-01-05 MED ORDER — KETOROLAC TROMETHAMINE 30 MG/ML IJ SOLN: 30.0000 mg | Freq: Once | INTRAMUSCULAR | Status: DC

## 2016-01-05 MED ORDER — LACTATED RINGERS IV SOLN
Freq: Once | INTRAVENOUS | Status: AC
  Administered 2016-01-05: 07:00:00 via INTRAVENOUS

## 2016-01-05 MED ORDER — MORPHINE SULFATE (PF) 0.5 MG/ML IJ SOLN
INTRAMUSCULAR | Status: AC
  Filled 2016-01-05: qty 10

## 2016-01-05 MED ORDER — BUPIVACAINE IN DEXTROSE 0.75-8.25 % IT SOLN
INTRATHECAL | Status: DC | PRN
  Administered 2016-01-05: 1.5 mL via INTRATHECAL

## 2016-01-05 MED ORDER — SODIUM CHLORIDE 0.9% FLUSH: 3.0000 mL | INTRAVENOUS | Status: DC | PRN

## 2016-01-05 MED ORDER — DEXAMETHASONE SODIUM PHOSPHATE 4 MG/ML IJ SOLN
INTRAMUSCULAR | Status: AC
  Filled 2016-01-05: qty 1

## 2016-01-05 MED ORDER — IBUPROFEN 600 MG PO TABS
600.0000 mg | ORAL_TABLET | Freq: Four times a day (QID) | ORAL | Status: DC
  Administered 2016-01-05 – 2016-01-08 (×11): 600 mg via ORAL
  Filled 2016-01-05 (×12): qty 1

## 2016-01-05 MED ORDER — HYDROMORPHONE HCL 1 MG/ML IJ SOLN: 0.2500 mg | INTRAMUSCULAR | Status: DC | PRN

## 2016-01-05 MED ORDER — PRENATAL MULTIVITAMIN CH
1.0000 | ORAL_TABLET | Freq: Every day | ORAL | Status: DC
  Administered 2016-01-06 – 2016-01-08 (×3): 1 via ORAL
  Filled 2016-01-05 (×3): qty 1

## 2016-01-05 MED ORDER — BUPIVACAINE HCL (PF) 0.25 % IJ SOLN
INTRAMUSCULAR | Status: DC | PRN
  Administered 2016-01-05: 15 mL

## 2016-01-05 MED ORDER — NALBUPHINE HCL 10 MG/ML IJ SOLN: 5.0000 mg | Freq: Once | INTRAMUSCULAR | Status: DC | PRN

## 2016-01-05 MED ORDER — KETOROLAC TROMETHAMINE 30 MG/ML IJ SOLN
INTRAMUSCULAR | Status: AC
Start: 2016-01-05 — End: 2016-01-05
  Filled 2016-01-05: qty 1

## 2016-01-05 MED ORDER — ONDANSETRON HCL 4 MG/2ML IJ SOLN
INTRAMUSCULAR | Status: AC
  Filled 2016-01-05: qty 2

## 2016-01-05 MED ORDER — COCONUT OIL OIL
1.0000 "application " | TOPICAL_OIL | Status: DC | PRN
  Administered 2016-01-07: 1 via TOPICAL
  Filled 2016-01-05: qty 120

## 2016-01-05 MED ORDER — OXYTOCIN 10 UNIT/ML IJ SOLN
40.0000 [IU] | INTRAVENOUS | Status: DC | PRN
  Administered 2016-01-05: 40 [IU] via INTRAVENOUS

## 2016-01-05 MED ORDER — CEFAZOLIN SODIUM-DEXTROSE 2-4 GM/100ML-% IV SOLN
2.0000 g | INTRAVENOUS | Status: AC
  Administered 2016-01-05: 2 g via INTRAVENOUS
  Filled 2016-01-05: qty 100

## 2016-01-05 MED ORDER — ONDANSETRON HCL 4 MG/2ML IJ SOLN
INTRAMUSCULAR | Status: DC | PRN
  Administered 2016-01-05: 4 mg via INTRAVENOUS

## 2016-01-05 MED ORDER — SCOPOLAMINE 1 MG/3DAYS TD PT72
MEDICATED_PATCH | TRANSDERMAL | Status: AC
  Administered 2016-01-05: 1.5 mg via TRANSDERMAL
  Filled 2016-01-05: qty 1

## 2016-01-05 MED ORDER — SCOPOLAMINE 1 MG/3DAYS TD PT72: 1.0000 | MEDICATED_PATCH | Freq: Once | TRANSDERMAL | Status: DC

## 2016-01-05 MED ORDER — NALBUPHINE HCL 10 MG/ML IJ SOLN: 5.0000 mg | INTRAMUSCULAR | Status: DC | PRN

## 2016-01-05 MED ORDER — DIPHENHYDRAMINE HCL 50 MG/ML IJ SOLN: 12.5000 mg | INTRAMUSCULAR | Status: DC | PRN

## 2016-01-05 MED ORDER — SIMETHICONE 80 MG PO CHEW: 80.0000 mg | CHEWABLE_TABLET | ORAL | Status: DC | PRN

## 2016-01-05 MED ORDER — BUPIVACAINE HCL (PF) 0.25 % IJ SOLN
INTRAMUSCULAR | Status: AC
  Filled 2016-01-05: qty 20

## 2016-01-05 MED ORDER — IBUPROFEN 600 MG PO TABS
600.0000 mg | ORAL_TABLET | Freq: Four times a day (QID) | ORAL | Status: DC | PRN
  Administered 2016-01-05: 600 mg via ORAL

## 2016-01-05 MED ORDER — KETOROLAC TROMETHAMINE 30 MG/ML IJ SOLN
30.0000 mg | Freq: Four times a day (QID) | INTRAMUSCULAR | Status: DC | PRN
  Administered 2016-01-05: 30 mg via INTRAMUSCULAR

## 2016-01-05 MED ORDER — DIPHENHYDRAMINE HCL 25 MG PO CAPS: 25.0000 mg | ORAL_CAPSULE | Freq: Four times a day (QID) | ORAL | Status: DC | PRN

## 2016-01-05 MED ORDER — SCOPOLAMINE 1 MG/3DAYS TD PT72
1.0000 | MEDICATED_PATCH | Freq: Once | TRANSDERMAL | Status: DC
  Administered 2016-01-05: 1.5 mg via TRANSDERMAL

## 2016-01-05 MED ORDER — DEXAMETHASONE SODIUM PHOSPHATE 4 MG/ML IJ SOLN
INTRAMUSCULAR | Status: DC | PRN
  Administered 2016-01-05: 4 mg via INTRAVENOUS

## 2016-01-05 MED ORDER — NALBUPHINE HCL 10 MG/ML IJ SOLN
5.0000 mg | Freq: Once | INTRAMUSCULAR | Status: DC | PRN
  Filled 2016-01-05: qty 1

## 2016-01-05 SURGICAL SUPPLY — 33 items
CLAMP CORD UMBIL (MISCELLANEOUS) IMPLANT
CLOTH BEACON ORANGE TIMEOUT ST (SAFETY) ×2 IMPLANT
CONTAINER PREFILL 10% NBF 15ML (MISCELLANEOUS) IMPLANT
DRSG OPSITE POSTOP 4X10 (GAUZE/BANDAGES/DRESSINGS) ×2 IMPLANT
DURAPREP 26ML APPLICATOR (WOUND CARE) ×2 IMPLANT
ELECT REM PT RETURN 9FT ADLT (ELECTROSURGICAL) ×2
ELECTRODE REM PT RTRN 9FT ADLT (ELECTROSURGICAL) ×1 IMPLANT
EXTRACTOR VACUUM M CUP 4 TUBE (SUCTIONS) IMPLANT
GLOVE BIO SURGEON STRL SZ7.5 (GLOVE) ×2 IMPLANT
GLOVE BIOGEL PI IND STRL 7.0 (GLOVE) ×1 IMPLANT
GLOVE BIOGEL PI INDICATOR 7.0 (GLOVE) ×1
GOWN STRL REUS W/TWL LRG LVL3 (GOWN DISPOSABLE) ×4 IMPLANT
KIT ABG SYR 3ML LUER SLIP (SYRINGE) IMPLANT
NEEDLE HYPO 22GX1.5 SAFETY (NEEDLE) ×2 IMPLANT
NEEDLE HYPO 25X5/8 SAFETYGLIDE (NEEDLE) IMPLANT
NEEDLE SPNL 20GX3.5 QUINCKE YW (NEEDLE) IMPLANT
NS IRRIG 1000ML POUR BTL (IV SOLUTION) ×2 IMPLANT
PACK C SECTION WH (CUSTOM PROCEDURE TRAY) ×2 IMPLANT
PENCIL SMOKE EVAC W/HOLSTER (ELECTROSURGICAL) ×2 IMPLANT
SUT MNCRL 0 VIOLET CTX 36 (SUTURE) ×3 IMPLANT
SUT MNCRL AB 3-0 PS2 27 (SUTURE) IMPLANT
SUT MON AB 2-0 CT1 27 (SUTURE) ×2 IMPLANT
SUT MON AB-0 CT1 36 (SUTURE) ×4 IMPLANT
SUT MONOCRYL 0 CTX 36 (SUTURE) ×3
SUT PLAIN 0 NONE (SUTURE) IMPLANT
SUT PLAIN 2 0 (SUTURE)
SUT PLAIN 2 0 XLH (SUTURE) IMPLANT
SUT PLAIN ABS 2-0 CT1 27XMFL (SUTURE) IMPLANT
SUT VIC AB 4-0 KS 27 (SUTURE) ×2 IMPLANT
SYR 20CC LL (SYRINGE) IMPLANT
SYR CONTROL 10ML LL (SYRINGE) ×2 IMPLANT
TOWEL OR 17X24 6PK STRL BLUE (TOWEL DISPOSABLE) ×2 IMPLANT
TRAY FOLEY CATH SILVER 14FR (SET/KITS/TRAYS/PACK) ×2 IMPLANT

## 2016-01-05 NOTE — Progress Notes (Signed)
Patient ID: Karen Powers, female   DOB: May 17, 1981, 35 y.o.   MRN: [id number] Patient seen and examined. Consent witnessed and signed. No changes noted. Update completed.

## 2016-01-05 NOTE — Op Note (Signed)
Cesarean Section Procedure Note  Indications: previous uterine incision kerr x2  Pre-operative Diagnosis: 39 week 0 day pregnancy.  Post-operative Diagnosis: same  Surgeon: Lovenia Kim   Assistants: RNFA  Anesthesia: Local anesthesia 0.25.% bupivacaine and Spinal anesthesia  ASA Class: 2  Procedure Details  The patient was seen in the Holding Room. The risks, benefits, complications, treatment options, and expected outcomes were discussed with the patient.  The patient concurred with the proposed plan, giving informed consent. The risks of anesthesia, infection, bleeding and possible injury to other organs discussed. Injury to bowel, bladder, or ureter with possible need for repair discussed. Possible need for transfusion with secondary risks of hepatitis or HIV acquisition discussed. Post operative complications to include but not limited to DVT, PE and Pneumonia noted. The site of surgery properly noted/marked. The patient was taken to Operating Room # 9, identified as Karen Powers and the procedure verified as C-Section Delivery. A Time Out was held and the above information confirmed.  After induction of anesthesia, the patient was draped and prepped in the usual sterile manner. A Pfannenstiel incision was made and carried down through the subcutaneous tissue to the fascia. Fascial incision was made and extended transversely using Mayo scissors. The fascia was separated from the underlying rectus tissue superiorly and inferiorly. The peritoneum was identified and entered. Peritoneal incision was extended longitudinally. The utero-vesical peritoneal reflection was incised transversely and the bladder flap was bluntly freed from the lower uterine segment. A low transverse uterine incision(Kerr hysterotomy) was made. Delivered from OA presentation was a  female with Apgar scores of 9 at one minute and 9 at five minutes. Bulb suctioning gently performed. Neonatal team in attendance.After  the umbilical cord was clamped and cut cord blood was obtained for evaluation. The placenta was removed intact and appeared normal. The uterus was curetted with a dry lap pack. Good hemostasis was noted.The uterine outline, tubes and ovaries appeared normal. The uterine incision was closed with running locked sutures of 0 Monocryl x 2 layers. Hemostasis was observed. The parietal peritoneum was closed with a running 2-0 Monocryl suture. The fascia was then reapproximated with running sutures of 0 Monocryl. The skin was reapproximated with 3-0 monocryl after Brewster closure with 2-0 plain.  Instrument, sponge, and needle counts were correct prior the abdominal closure and at the conclusion of the case.   Findings: FTLM, OA,LUS window  Estimated Blood Loss:  500         Drains: foley                 Specimens: placenta                 Complications:  None; patient tolerated the procedure well.         Disposition: PACU - hemodynamically stable.         Condition: stable  Attending Attestation: I performed the procedure.

## 2016-01-05 NOTE — Anesthesia Postprocedure Evaluation (Signed)
Anesthesia Post Note  Patient: Karen Powers  Procedure(s) Performed: Procedure(s) (LRB): Repeat CESAREAN SECTION (N/A)  Patient location during evaluation: Mother Baby Anesthesia Type: Spinal Level of consciousness: awake and alert and oriented Pain management: pain level controlled Vital Signs Assessment: post-procedure vital signs reviewed and stable Respiratory status: spontaneous breathing Cardiovascular status: blood pressure returned to baseline Postop Assessment: no headache, no backache, spinal receding, patient able to bend at knees, no signs of nausea or vomiting and adequate PO intake Anesthetic complications: no     Last Vitals:  Filed Vitals:   01/05/16 1200 01/05/16 1300  BP: 131/64 131/66  Pulse: 63 70  Temp: 37.3 C 37.1 C  Resp: 16 16    Last Pain: There were no vitals filed for this visit. Pain Goal: Patients Stated Pain Goal: 0 (01/05/16 1010)               Zackariah Vanderpol

## 2016-01-05 NOTE — Transfer of Care (Signed)
Immediate Anesthesia Transfer of Care Note  Patient: Karen Powers  Procedure(s) Performed: Procedure(s) with comments: Repeat CESAREAN SECTION (N/A) - EDD: 01/12/16   Patient Location: PACU  Anesthesia Type:Spinal  Level of Consciousness: awake, alert  and oriented  Airway & Oxygen Therapy: Patient Spontanous Breathing  Post-op Assessment: Report given to RN and Post -op Vital signs reviewed and stable  Post vital signs: Reviewed and stable  Last Vitals:  Filed Vitals:   01/05/16 0613  BP: 125/94  Pulse: 87  Temp: 36.8 C  Resp: 20    Last Pain: There were no vitals filed for this visit.    Patients Stated Pain Goal: 4 ([id number])  Complications: No apparent anesthesia complications

## 2016-01-05 NOTE — Anesthesia Procedure Notes (Addendum)
Spinal Patient location during procedure: OR Start time: 01/05/2016 7:40 AM End time: 01/05/2016 7:42 AM Staffing Anesthesiologist: Lyn Hollingshead Performed by: anesthesiologist  Preanesthetic Checklist Completed: patient identified, surgical consent, pre-op evaluation, timeout performed, IV checked, risks and benefits discussed and monitors and equipment checked Spinal Block Patient position: sitting Prep: site prepped and draped and DuraPrep Patient monitoring: heart rate, cardiac monitor, continuous pulse ox and blood pressure Approach: midline Location: L3-4 Injection technique: single-shot Needle Needle type: Sprotte  Needle gauge: 24 G Needle length: 9 cm Needle insertion depth: 5 cm Assessment Sensory level: T4

## 2016-01-05 NOTE — Anesthesia Postprocedure Evaluation (Signed)
Anesthesia Post Note  Patient: Karen Powers  Procedure(s) Performed: Procedure(s) (LRB): Repeat CESAREAN SECTION (N/A)  Patient location during evaluation: PACU Anesthesia Type: Spinal Level of consciousness: awake Pain management: pain level controlled Vital Signs Assessment: post-procedure vital signs reviewed and stable Respiratory status: spontaneous breathing Cardiovascular status: stable Postop Assessment: no headache, no backache, spinal receding, patient able to bend at knees and no signs of nausea or vomiting Anesthetic complications: no     Last Vitals:  Filed Vitals:   01/05/16 1001 01/05/16 1010  BP: 117/66 120/59  Pulse: 54 53  Temp:  36.4 C  Resp: 16 16    Last Pain: There were no vitals filed for this visit. Pain Goal: Patients Stated Pain Goal: 0 (01/05/16 1010)               Natori Gudino JR,JOHN Rhyatt Muska

## 2016-01-05 NOTE — Lactation Note (Signed)
This note was copied from a baby's chart. Lactation Consultation Note Initial visit at 8 hours of age.  Mom is experienced with 2 older children breastfeeding well.  Mom reports baby has had several good feedings and denies pain with latch.  Baby is asleep, but mom wants to attempt feeding.  Mom has easily expressed colostrum, applied to baby's mouth, baby did not wake for feeding.  [facility] LC resources given and discussed.  Encouraged to feed with early cues on demand.  Early newborn behavior discussed.  Hand expression demonstrated by mom with colostrum visible.  Mom to call for assist as needed.    Patient Name: Boy Lonette Savel [id number] Date: 01/05/2016 Reason for consult: Initial assessment   Maternal Data Has patient been taught Hand Expression?: Yes Does the patient have breastfeeding experience prior to this delivery?: Yes  Feeding Feeding Type: Breast Fed Length of feed: 15 min  LATCH Score/Interventions Latch: Too sleepy or reluctant, no latch achieved, no sucking elicited.              Intervention(s): Breastfeeding basics reviewed     Lactation Tools Discussed/Used     Consult Status Consult Status: Follow-up Date: 01/06/16 Follow-up type: In-patient    Justice Britain 01/05/2016, 4:13 PM

## 2016-01-05 NOTE — Addendum Note (Signed)
Addendum  created 01/05/16 1335 by Talbot Grumbling, CRNA   Modules edited: Clinical Notes   Clinical Notes:  File: [id number]

## 2016-01-06 ENCOUNTER — Encounter (HOSPITAL_COMMUNITY): Payer: Self-pay | Admitting: Obstetrics and Gynecology

## 2016-01-06 DIAGNOSIS — IMO0001 Reserved for inherently not codable concepts without codable children: Secondary | ICD-10-CM

## 2016-01-06 DIAGNOSIS — D62 Acute posthemorrhagic anemia: Secondary | ICD-10-CM

## 2016-01-06 HISTORY — DX: Acute posthemorrhagic anemia: D62

## 2016-01-06 HISTORY — DX: Reserved for inherently not codable concepts without codable children: IMO0001

## 2016-01-06 LAB — CBC
HCT: 25.3 % — ABNORMAL LOW (ref 36.0–46.0)
Hemoglobin: 8.5 g/dL — ABNORMAL LOW (ref 12.0–15.0)
MCH: 31.7 pg (ref 26.0–34.0)
MCHC: 33.6 g/dL (ref 30.0–36.0)
MCV: 94.4 fL (ref 78.0–100.0)
Platelets: 142 10*3/uL — ABNORMAL LOW (ref 150–400)
RBC: 2.68 MIL/uL — ABNORMAL LOW (ref 3.87–5.11)
RDW: 13.3 % (ref 11.5–15.5)
WBC: 11.5 10*3/uL — ABNORMAL HIGH (ref 4.0–10.5)

## 2016-01-06 MED ORDER — POLYSACCHARIDE IRON COMPLEX 150 MG PO CAPS
150.0000 mg | ORAL_CAPSULE | Freq: Two times a day (BID) | ORAL | Status: DC
  Administered 2016-01-06 – 2016-01-08 (×5): 150 mg via ORAL
  Filled 2016-01-06 (×5): qty 1

## 2016-01-06 MED ORDER — RHO D IMMUNE GLOBULIN 1500 UNIT/2ML IJ SOSY
300.0000 ug | PREFILLED_SYRINGE | Freq: Once | INTRAMUSCULAR | Status: AC
  Administered 2016-01-06: 300 ug via INTRAVENOUS
  Filled 2016-01-06: qty 2

## 2016-01-06 MED ORDER — MAGNESIUM OXIDE 400 (241.3 MG) MG PO TABS
400.0000 mg | ORAL_TABLET | Freq: Every day | ORAL | Status: DC
  Administered 2016-01-06 – 2016-01-08 (×3): 400 mg via ORAL
  Filled 2016-01-06 (×4): qty 1

## 2016-01-06 NOTE — Lactation Note (Signed)
This note was copied from a baby's chart. Lactation Consultation Note  Patient Name: Karen Powers [id number] Date: 01/06/2016 Reason for consult: Follow-up assessment (per mom baby recently breast fed 45 mins )  Baby is 17 hours old and  Per mom cluster fed long feedings over night and my breast are feeling fuller Already .  As Keith walked in room mom was settling baby in crib .  LC encouraged mom to call for feeding assessment.     Maternal Data    Feeding Feeding Type: Breast Fed Length of feed: 45 min (per mom )  LATCH Score/Interventions                Intervention(s): Breastfeeding basics reviewed     Lactation Tools Discussed/Used WIC Program: No   Consult Status Consult Status: Follow-up (mom plans to page for feeding assessment ) Date: 01/06/16 Follow-up type: In-patient    Myer Haff 01/06/2016, 12:25 PM

## 2016-01-06 NOTE — Progress Notes (Signed)
Patient ID: Karen Powers, female   DOB: 1981/03/05, 35 y.o.   MRN: [id number] Subjective: S/P Repeat Cesarean Delivery  POD# 35 Information for the patient's newborn:  Gussie, Peralta [id number]  female  / circ planning  Reports feeling well. Feeding: breast Patient reports tolerating PO.  Breast symptoms: none Pain controlled with ibuprofen (OTC) and narcotic analgesics including Percocet Denies HA/SOB/C/P/N/V/dizziness. Flatus present. No BM. She reports vaginal bleeding as normal, without clots.  She is ambulating, urinating without difficult.     Objective:   VS:  Filed Vitals:   01/05/16 2110 01/06/16 0110 01/06/16 0510 01/06/16 0800  BP: 123/57 96/57 102/62 105/63  Pulse: 79 73 80 65  Temp: 99 F (37.2 C) 98.4 F (36.9 C) 98.1 F (36.7 C) 99.1 F (37.3 C)  TempSrc: Oral Oral Oral Oral  Resp: 18 18 18 18   SpO2: 98% 98% 99%      Intake/Output Summary (Last 24 hours) at 01/06/16 [id number] Last data filed at 01/06/16 0110  Gross per 24 hour  Intake 2747.92 ml  Output    975 ml  Net 1772.92 ml        Recent Labs  01/04/16 1015 01/06/16 0517  WBC 9.6 11.5*  HGB 10.6* 8.5*  HCT 30.3* 25.3*  PLT 164 142*     Blood type: O NEG (05/12 0517) / Infant Rh POS - Rhophylac Indicated  Rubella: Immune    Physical Exam:   General: alert, cooperative and no distress  CV: Regular rate and rhythm, S1S2 present or without murmur or extra heart sounds  Resp: clear  Abdomen: soft, nontender, normal bowel sounds  Incision: dried serous drainage present on the RT side of Honeycomb / Honeycomb dressing intact / skin well-approximated with sutures  Uterine Fundus: firm, 1 FB below umbilicus, nontender  Lochia: minimal  Ext: extremities normal, atraumatic, no cyanosis or edema, Homans sign is negative, no sign of DVT and no edema, redness or tenderness in the calves or thighs   Assessment/Plan: 35 y.o.   POD# 1.  S/P Cesarean Delivery.  Indications: repeat                 Principal Problem:   Postpartum care following cesarean delivery (5/11) Active Problems:   Previous cesarean section   Maternal iron deficiency anemia   Acute blood loss anemia  Doing well, stable.               Regular diet as tolerated D/C IV after Rhophylac administration Start Niferex 150 mg BID and Magnesium Oxide 400 mg daily Ambulate Routine post-op care  Graceann Congress, MSN, CNM 01/06/2016, 9:10 AM

## 2016-01-07 LAB — TYPE AND SCREEN
ABO/RH(D): O NEG
Antibody Screen: POSITIVE
DAT, IgG: NEGATIVE
Unit division: 0
Unit division: 0

## 2016-01-07 LAB — RH IG WORKUP (INCLUDES ABO/RH)
ABO/RH(D): O NEG
Gestational Age(Wks): 39
Unit division: 0

## 2016-01-07 NOTE — Progress Notes (Signed)
Patient ID: Karen Powers, female   DOB: [birth year], 35 y.o.   MRN: [id number] Subjective: S/P Repeat Cesarean Delivery  POD# 2 Information for the patient's newborn:  Alantra, Gaylord [id number]  female   / circ done  Reports feeling well. Feeding: breast Patient reports tolerating PO.  Breast symptoms: none Pain controlled with ibuprofen (OTC) and narcotic analgesics including Percocet Denies HA/SOB/C/P/N/V/dizziness. Flatus present. No BM. She reports vaginal bleeding as normal, without clots.  She is ambulating, urinating without difficult.     Objective:   VS:  Filed Vitals:   01/06/16 0510 01/06/16 0800 01/06/16 1812 01/07/16 0500  BP: 102/62 105/63 113/75 116/57  Pulse: 80 65 64 69  Temp: 98.1 F (36.7 C) 99.1 F (37.3 C) 99 F (37.2 C) 98.4 F (36.9 C)  TempSrc: Oral Oral Oral Oral  Resp: 18 18 16 18   SpO2: 99%       No intake or output data in the 24 hours ending 01/07/16 1022       Recent Labs  01/06/16 0517  WBC 11.5*  HGB 8.5*  HCT 25.3*  PLT 142*     Blood type: O NEG (05/12 0517) / Infant Rh POS - Rhophylac done 01/06/2016  Rubella: Immune    Physical Exam:   General: alert, cooperative and no distress  CV: Regular rate and rhythm, S1S2 present or without murmur or extra heart sounds  Resp: clear  Abdomen: soft, nontender, normal bowel sounds  Incision: dried serous drainage present on the RT side of Honeycomb / Honeycomb dressing intact / skin well-approximated with sutures  Uterine Fundus: firm, 2 FB below umbilicus, nontender  Lochia: minimal  Ext: extremities normal, atraumatic, no cyanosis or edema, Homans sign is negative, no sign of DVT and no edema, redness or tenderness in the calves or thighs   Assessment/Plan: 35 y.o.   POD# 2.  S/P Cesarean Delivery.  Indications: repeat                Principal Problem:   Postpartum care following cesarean delivery (5/11) Active Problems:   Previous cesarean section   Maternal iron  deficiency anemia   Acute blood loss anemia  Doing well, stable.               Regular diet as tolerated Continue Niferex 150 mg BID and Magnesium Oxide 400 mg daily Ambulate 2-3 times in hallway today Routine post-op care Anticipate D/C home tomorrow - declined early d/c d/t infant observation for jaundice   Laury Deep, M, MSN, CNM 01/07/2016, 9:45 AM

## 2016-01-07 NOTE — Lactation Note (Signed)
This note was copied from a baby's chart. Lactation Consultation Note  Patient Name: Karen Powers [id number] Date: 01/07/2016 Reason for consult: Follow-up assessment Baby at 60 hr of life and mom reports her milk has come in making it hard for baby to latch. She is sing the DEBP between feeding. She is storing the milk for her return to work. 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 She requested the [facility]. Given inverted nipple shells to wear between feedings. She had over supply with both of her other children. She is aware of signs/symptoms/treatment/prevention of mastitis. She has lactation handouts. She is aware of lactation services and support group.      Maternal Data    Feeding    LATCH Score/Interventions                      Lactation Tools Discussed/Used     Consult Status Consult Status: Follow-up Date: 01/08/16 Follow-up type: In-patient    Denzil Hughes 01/07/2016, 8:14 PM

## 2016-01-08 MED ORDER — MAGNESIUM OXIDE 400 (241.3 MG) MG PO TABS: 400.0000 mg | ORAL_TABLET | Freq: Every day | ORAL | Status: DC

## 2016-01-08 MED ORDER — OXYCODONE-ACETAMINOPHEN 5-325 MG PO TABS: 1.0000 | ORAL_TABLET | ORAL | Status: DC | PRN

## 2016-01-08 MED ORDER — POLYSACCHARIDE IRON COMPLEX 150 MG PO CAPS: 150.0000 mg | ORAL_CAPSULE | Freq: Two times a day (BID) | ORAL | Status: DC

## 2016-01-08 MED ORDER — IBUPROFEN 600 MG PO TABS: 600.0000 mg | ORAL_TABLET | Freq: Four times a day (QID) | ORAL | Status: DC | PRN

## 2016-01-08 NOTE — Progress Notes (Signed)
Patient ID: Karen Powers, female   DOB: 1981/02/25, 35 y.o.   MRN: [id number] Subjective: S/P Repeat Cesarean Delivery  POD# 3 Information for the patient's newborn:  [name], Cesaro [id number]  female   / circ done  Reports feeling well. Feeding: breast Patient reports tolerating PO.  Breast symptoms: none Pain controlled with ibuprofen (OTC) and narcotic analgesics including Percocet Denies HA/SOB/C/P/N/V/dizziness. Flatus present. No BM. She reports vaginal bleeding as normal, without clots.  She is ambulating, urinating without difficult.     Objective:   VS:  Filed Vitals:   01/06/16 1812 01/07/16 0500 01/07/16 1900 01/08/16 0629  BP: 113/75 116/57 123/79 122/68  Pulse: 64 69 78 77  Temp: 99 F (37.2 C) 98.4 F (36.9 C) 98.2 F (36.8 C) 98 F (36.7 C)  TempSrc: Oral Oral Oral Oral  Resp: 16 18 20 20   SpO2:   100%     No intake or output data in the 24 hours ending 01/08/16 0901       Recent Labs  01/06/16 0517  WBC 11.5*  HGB 8.5*  HCT 25.3*  PLT 142*     Blood type: O NEG (05/12 0517) / Infant Rh POS - Rhophylac done 01/06/2016  Rubella: Immune    Physical Exam:   General: alert, cooperative and no distress  CV: Regular rate and rhythm, S1S2 present or without murmur or extra heart sounds  Resp: clear  Abdomen: soft, nontender, normal bowel sounds  Incision: dried serous drainage present on the RT side of Honeycomb / Honeycomb dressing intact / skin well-approximated with sutures  Uterine Fundus: firm, 1 FB below umbilicus, nontender  Lochia: minimal  Ext: extremities normal, atraumatic, no cyanosis or edema, Homans sign is negative, no sign of DVT and no edema, redness or tenderness in the calves or thighs   Assessment/Plan: 35 y.o.   POD# 3.  S/P Cesarean Delivery.  Indications: repeat                Principal Problem:   Postpartum care following cesarean delivery (5/11) Active Problems:   Previous cesarean section   Maternal iron deficiency  anemia   Acute blood loss anemia  Doing well, stable.               Regular diet as tolerated Continue Niferex 150 mg BID and Magnesium Oxide 400 mg daily Ambulate  Routine post-op care D/C home today   Laury Deep, M, MSN, CNM 01/08/2016, 9:45 AM

## 2016-01-08 NOTE — Discharge Summary (Signed)
OB Discharge Summary     Patient Name: Karen Powers DOB: [birth year] MRN: [id number]  Date of admission: 01/05/2016 Delivering MD: Brien Few   Date of discharge: 01/08/2016  Admitting diagnosis: Previous Cesarean Section Intrauterine pregnancy: [redacted]w[redacted]d     Secondary diagnosis:  Principal Problem:   Postpartum care following cesarean delivery (5/11) Active Problems:   Previous cesarean section   Maternal iron deficiency anemia   Acute blood loss anemia  Additional problems: none     Discharge diagnosis: Term Pregnancy Delivered                                                                                                Post partum procedures:none  Augmentation: none  Complications: None  Hospital course:  Sceduled C/S   35 y.o. yo [id number] at [redacted]w[redacted]d was admitted to the hospital 01/05/2016 for scheduled cesarean section with the following indication:Elective Repeat.  Membrane Rupture Time/Date: 8:03 AM ,01/05/2016   Patient delivered a Viable infant.01/05/2016  Details of operation can be found in separate operative note.  Pateint had an uncomplicated postpartum course.  She is ambulating, tolerating a regular diet, passing flatus, and urinating well. Patient is discharged home in stable condition on  01/08/2016          Physical exam  Filed Vitals:   01/06/16 1812 01/07/16 0500 01/07/16 1900 01/08/16 0629  BP: 113/75 116/57 123/79 122/68  Pulse: 64 69 78 77  Temp: 99 F (37.2 C) 98.4 F (36.9 C) 98.2 F (36.8 C) 98 F (36.7 C)  TempSrc: Oral Oral Oral Oral  Resp: 16 18 20 20   SpO2:   100%    General: alert, cooperative and no distress Lochia: appropriate Uterine Fundus: firm, midline, U-1 Incision: Healing well with no significant drainage, No significant erythema, Dressing is clean, dry, and intact, skin well-approximated with sutures DVT Evaluation: No evidence of DVT seen on physical exam. Negative Homan's sign. No cords or calf tenderness. Trace RT  Ankle edema is present  Labs: Lab Results  Component Value Date   WBC 11.5* 01/06/2016   HGB 8.5* 01/06/2016   HCT 25.3* 01/06/2016   MCV 94.4 01/06/2016   PLT 142* 01/06/2016   CMP Latest Ref Rng 01/05/2016  Glucose 65 - 99 mg/dL 84  BUN 6 - 20 mg/dL 7  Creatinine 0.44 - 1.00 mg/dL 0.54  Sodium 135 - 145 mmol/L 136  Potassium 3.5 - 5.1 mmol/L 3.5  Chloride 101 - 111 mmol/L 107  CO2 22 - 32 mmol/L 20(L)  Calcium 8.9 - 10.3 mg/dL 8.3(L)  Total Protein 6.0 - 8.3 g/dL -  Total Bilirubin 0.2 - 1.2 mg/dL -  Alkaline Phos 39 - 117 U/L -  AST 0 - 37 U/L -  ALT 0 - 35 U/L -    Discharge instruction: per After Visit Summary and "Baby and Me Booklet".  After visit meds:    Medication List    TAKE these medications        acetaminophen 500 MG tablet  Commonly known as:  TYLENOL  Take 1,000 mg by mouth every 6 (six)  hours as needed for headache.     ibuprofen 600 MG tablet  Commonly known as:  ADVIL,MOTRIN  Take 1 tablet (600 mg total) by mouth every 6 (six) hours as needed for mild pain.     iron polysaccharides 150 MG capsule  Commonly known as:  NIFEREX  Take 1 capsule (150 mg total) by mouth 2 (two) times daily.     loratadine 10 MG tablet  Commonly known as:  CLARITIN  Take 10 mg by mouth daily as needed for allergies.     magnesium oxide 400 (241.3 Mg) MG tablet  Commonly known as:  MAG-OX  Take 1 tablet (400 mg total) by mouth daily.     oxyCODONE-acetaminophen 5-325 MG tablet  Commonly known as:  ROXICET  Take 1-2 tablets by mouth every 4 (four) hours as needed for severe pain.     prenatal multivitamin Tabs tablet  Take 1 tablet by mouth daily at 12 noon.     ranitidine 150 MG tablet  Commonly known as:  ZANTAC  Take 150 mg by mouth daily as needed for heartburn.        Diet: routine diet  Activity: Advance as tolerated. Pelvic rest for 6 weeks.   Outpatient follow up:6 weeks Follow up Appt:No future appointments. Follow up Visit:No Follow-up on  file.  Postpartum contraception: Undecided  Newborn Data: Live born female on 01/05/2016 Birth Weight: 8 lb 7 oz (3827 g) APGAR: 8, 9  Baby Feeding: Breast Disposition:home with mother   01/08/2016 Laury Deep, Jerilynn Mages, CNM

## 2016-01-08 NOTE — Discharge Instructions (Signed)
Breast Pumping Tips °If you are breastfeeding, there may be times when you cannot feed your baby directly. Returning to work or going on a trip are common examples. Pumping allows you to store breast milk and feed it to your baby later.  °You may not get much milk when you first start to pump. Your breasts should start to make more after a few days. If you pump at the times you usually feed your baby, you may be able to keep making enough milk to feed your baby without also using formula. The more often you pump, the more milk you will produce.  °WHEN SHOULD I PUMP?  °· You can begin to pump soon after delivery. However, some experts recommend waiting about 4 weeks before giving your infant a bottle to make sure breastfeeding is going well.  °· If you plan to return to work, begin pumping a few weeks before. This will help you develop techniques that work best for you. It also lets you build up a supply of breast milk.   °· When you are with your infant, feed on demand and pump after each feeding.   °· When you are away from your infant for several hours, pump for about 15 minutes every 2-3 hours. Pump both breasts at the same time if you can.   °· If your infant has a formula feeding, make sure to pump around the same time.     °· If you drink any alcohol, wait 2 hours before pumping.   °HOW DO I PREPARE TO PUMP? °Your let-down reflex is the natural reaction to stimulation that makes your breast milk flow. It is easier to stimulate this reflex when you are relaxed. Find relaxation techniques that work for you. If you have difficulty with your let-down reflex, try these methods:  °· Smell one of your infant's blankets or an item of clothing.   °· Look at a picture or video of your infant.   °· Sit in a quiet, private space.   °· Massage the breast you plan to pump.   °· Place soothing warmth on the breast.   °· Play relaxing music.   °WHAT ARE SOME GENERAL BREAST PUMPING TIPS? °· Wash your hands before you pump. You  do not need to wash your nipples or breasts. °· There are three ways to pump. °· You can use your hand to massage and compress your breast. °· You can use a handheld manual pump. °· You can use an electric pump.   °· Make sure the suction cup (flange) on the breast pump is the right size. Place the flange directly over the nipple. If it is the wrong size or placed the wrong way, it may be painful and cause nipple damage.   °· If pumping is uncomfortable, apply a small amount of purified or modified lanolin to your nipple and areola. °· If you are using an electric pump, adjust the speed and suction power to be more comfortable. °· If pumping is painful or if you are not getting very much milk, you may need a different type of pump. A lactation consultant can help you determine what type of pump to use.   °· Keep a full water bottle near you at all times. Drinking lots of fluid helps you make more milk.  °· You can store your milk to use later. Pumped breast milk can be stored in a sealable, sterile container or plastic bag. Label all stored breast milk with the date you pumped it. °· Milk can stay out at room temperature for up to 8 hours. °·   You can store your milk in the refrigerator for up to 8 days. °· You can store your milk in the freezer for 3 months. Thaw frozen milk using warm water. Do not put it in the microwave. °· Do not smoke. Smoking can lower your milk supply and harm your infant. If you need help quitting, ask your health care provider to recommend a program.   °WHEN SHOULD I CALL MY HEALTH CARE PROVIDER OR A LACTATION CONSULTANT? °· You are having trouble pumping. °· You are concerned that you are not making enough milk. °· You have nipple pain, soreness, or redness. °· You want to use birth control. Birth control pills may lower your milk supply. Talk to your health care provider about your options. °  °This information is not intended to replace advice given to you by your health care provider.  Make sure you discuss any questions you have with your health care provider. °  °Document Released: 01/31/2010 Document Revised: 08/18/2013 Document Reviewed: 06/05/2013 °[employer]vier Interactive Patient Education ©2016 [employer] °Postpartum Depression and Baby Blues °The postpartum period begins right after the birth of a baby. During this time, there is often a great amount of joy and excitement. It is also a time of many changes in the life of the parents. Regardless of how many times a mother gives birth, each child brings new challenges and dynamics to the family. It is not unusual to have feelings of excitement along with confusing shifts in moods, emotions, and thoughts. All mothers are at risk of developing postpartum depression or the "baby blues." These mood changes can occur right after giving birth, or they may occur many months after giving birth. The baby blues or postpartum depression can be mild or severe. Additionally, postpartum depression can go away rather quickly, or it can be a long-term condition.  °CAUSES °Raised hormone levels and the rapid drop in those levels are thought to be a main cause of postpartum depression and the baby blues. A number of hormones change during and after pregnancy. Estrogen and progesterone usually decrease right after the delivery of your baby. The levels of thyroid hormone and various cortisol steroids also rapidly drop. Other factors that play a role in these mood changes include major life events and genetics.  °RISK FACTORS °If you have any of the following risks for the baby blues or postpartum depression, know what symptoms to watch out for during the postpartum period. Risk factors that may increase the likelihood of getting the baby blues or postpartum depression include: °· Having a personal or family history of depression.   °· Having depression while being pregnant.   °· Having premenstrual mood issues or mood issues related to oral  contraceptives. °· Having a lot of life stress.   °· Having marital conflict.   °· Lacking a social support network.   °· Having a baby with special needs.   °· Having health problems, such as diabetes.   °SIGNS AND SYMPTOMS °Symptoms of baby blues include: °· Brief changes in mood, such as going from extreme happiness to sadness. °· Decreased concentration.   °· Difficulty sleeping.   °· Crying spells, tearfulness.   °· Irritability.   °· Anxiety.   °Symptoms of postpartum depression typically begin within the first month after giving birth. These symptoms include: °· Difficulty sleeping or excessive sleepiness.   °· Marked weight loss.   °· Agitation.   °· Feelings of worthlessness.   °· Lack of interest in activity or food.   °Postpartum psychosis is a very serious condition and can be dangerous. Fortunately, it is   rare. Displaying any of the following symptoms is cause for immediate medical attention. Symptoms of postpartum psychosis include:  °· Hallucinations and delusions.   °· Bizarre or disorganized behavior.   °· Confusion or disorientation.   °DIAGNOSIS  °A diagnosis is made by an evaluation of your symptoms. There are no medical or lab tests that lead to a diagnosis, but there are various questionnaires that a health care provider may use to identify those with the baby blues, postpartum depression, or psychosis. Often, a screening tool called the Edinburgh Postnatal Depression Scale is used to diagnose depression in the postpartum period.  °TREATMENT °The baby blues usually goes away on its own in 1-2 weeks. Social support is often all that is needed. You will be encouraged to get adequate sleep and rest. Occasionally, you may be given medicines to help you sleep.  °Postpartum depression requires treatment because it can last several months or longer if it is not treated. Treatment may include individual or group therapy, medicine, or both to address any social, physiological, and psychological factors  that may play a role in the depression. Regular exercise, a healthy diet, rest, and social support may also be strongly recommended.  °Postpartum psychosis is more serious and needs treatment right away. Hospitalization is often needed. °HOME CARE INSTRUCTIONS °· Get as much rest as you can. Nap when the baby sleeps.   °· Exercise regularly. Some women find yoga and walking to be beneficial.   °· Eat a balanced and nourishing diet.   °· Do little things that you enjoy. Have a cup of tea, take a bubble bath, read your favorite magazine, or listen to your favorite music. °· Avoid alcohol.   °· Ask for help with household chores, cooking, grocery shopping, or running errands as needed. Do not try to do everything.   °· Talk to people close to you about how you are feeling. Get support from your partner, family members, friends, or other new moms. °· Try to stay positive in how you think. Think about the things you are grateful for.   °· Do not spend a lot of time alone.   °· Only take over-the-counter or prescription medicine as directed by your health care provider. °· Keep all your postpartum appointments.   °· Let your health care provider know if you have any concerns.   °SEEK MEDICAL CARE IF: °You are having a reaction to or problems with your medicine. °SEEK IMMEDIATE MEDICAL CARE IF: °· You have suicidal feelings.   °· You think you may harm the baby or someone else. °MAKE SURE YOU: °· Understand these instructions. °· Will watch your condition. °· Will get help right away if you are not doing well or get worse. °  °This information is not intended to replace advice given to you by your health care provider. Make sure you discuss any questions you have with your health care provider. °  °Document Released: 05/17/2004 Document Revised: 08/18/2013 Document Reviewed: 05/25/2013 °Elsevier Interactive Patient Education ©2016 [employer] °Postpartum Care After Cesarean Delivery °After you deliver your newborn  (postpartum period), the usual stay in the hospital is 24-72 hours. If there were problems with your labor or delivery, or if you have other medical problems, you might be in the hospital longer.  °While you are in the hospital, you will receive help and instructions on how to care for yourself and your newborn during the postpartum period.  °While you are in the hospital: °· It is normal for you to have pain or discomfort from the incision in your   abdomen. Be sure to tell your nurses when you are having pain, where the pain is located, and what makes the pain worse. °· If you are breastfeeding, you may feel uncomfortable contractions of your uterus for a couple of weeks. This is normal. The contractions help your uterus get back to normal size. °· It is normal to have some bleeding after delivery. °· For the first 1-3 days after delivery, the flow is red and the amount may be similar to a period. °· It is common for the flow to start and stop. °· In the first few days, you may pass some small clots. Let your nurses know if you begin to pass large clots or your flow increases. °· Do not  flush blood clots down the toilet before having the nurse look at them. °· During the next 3-10 days after delivery, your flow should become more watery and pink or brown-tinged in color. °· Ten to fourteen days after delivery, your flow should be a small amount of yellowish-white discharge. °· The amount of your flow will decrease over the first few weeks after delivery. Your flow may stop in 6-8 weeks. Most women have had their flow stop by 12 weeks after delivery. °· You should change your sanitary pads frequently. °· Wash your hands thoroughly with soap and water for at least 20 seconds after changing pads, using the toilet, or before holding or feeding your newborn. °· Your intravenous (IV) tubing will be removed when you are drinking enough fluids. °· The urine drainage tube (urinary catheter) that was inserted before delivery  may be removed within 6-8 hours after delivery or when feeling returns to your legs. You should feel like you need to empty your bladder within the first 6-8 hours after the catheter has been removed. °· In case you become weak, lightheaded, or faint, call your nurse before you get out of bed for the first time and before you take a shower for the first time. °· Within the first few days after delivery, your breasts may begin to feel tender and full. This is called engorgement. Breast tenderness usually goes away within 48-72 hours after engorgement occurs. You may also notice milk leaking from your breasts. If you are not breastfeeding, do not stimulate your breasts. Breast stimulation can make your breasts produce more milk. °· Spending as much time as possible with your newborn is very important. During this time, you and your newborn can feel close and get to know each other. Having your newborn stay in your room (rooming in) will help to strengthen the bond with your newborn. It will give you time to get to know your newborn and become comfortable caring for your newborn. °· Your hormones change after delivery. Sometimes the hormone changes can temporarily cause you to feel sad or tearful. These feelings should not last more than a few days. If these feelings last longer than that, you should talk to your caregiver. °· If desired, talk to your caregiver about methods of family planning or contraception. °· Talk to your caregiver about immunizations. Your caregiver may want you to have the following immunizations before leaving the hospital: °· Tetanus, diphtheria, and pertussis (Tdap) or tetanus and diphtheria (Td) immunization. It is very important that you and your family (including grandparents) or others caring for your newborn are up-to-date with the Tdap or Td immunizations. The Tdap or Td immunization can help protect your newborn from getting ill. °· Rubella immunization. °· Varicella (chickenpox)    immunization. °· Influenza immunization. You should receive this annual immunization if you did not receive the immunization during your pregnancy. °  °This information is not intended to replace advice given to you by your health care provider. Make sure you discuss any questions you have with your health care provider. °  °Document Released: 05/07/2012 Document Reviewed: 05/07/2012 °[employer]vier Interactive Patient Education ©2016 [employer] °Breastfeeding and Mastitis °Mastitis is inflammation of the breast tissue. It can occur in women who are breastfeeding. This can make breastfeeding painful. Mastitis will sometimes go away on its own. Your health care provider will help determine if treatment is needed. °CAUSES °Mastitis is often associated with a blocked milk (lactiferous) duct. This can happen when too much milk builds up in the breast. Causes of excess milk in the breast can include: °· Poor latch-on. If your baby is not latched onto the breast properly, she or he may not empty your breast completely while breastfeeding. °· Allowing too much time to pass between feedings. °· Wearing a bra or other clothing that is too tight. This puts extra pressure on the lactiferous ducts so milk does not flow through them as it should. °Mastitis can also be caused by a bacterial infection. Bacteria may enter the breast tissue through cuts or openings in the skin. In women who are breastfeeding, this may occur because of cracked or irritated skin. Cracks in the skin are often caused when your baby does not latch on properly to the breast. °SIGNS AND SYMPTOMS °· Swelling, redness, tenderness, and pain in an area of the breast. °· Swelling of the glands under the arm on the same side. °· Fever may or may not accompany mastitis. °If an infection is allowed to progress, a collection of pus (abscess) may develop. °DIAGNOSIS  °Your health care provider can usually diagnose mastitis based on your symptoms and a physical exam.  Tests may be done to help confirm the diagnosis. These may include: °· Removal of pus from the breast by applying pressure to the area. This pus can be examined in the lab to determine which bacteria are present. If an abscess has developed, the fluid in the abscess can be removed with a needle. This can also be used to confirm the diagnosis and determine the bacteria present. In most cases, pus will not be present. °· Blood tests to determine if your body is fighting a bacterial infection. °· Mammogram or ultrasound tests to rule out other problems or diseases. °TREATMENT  °Mastitis that occurs with breastfeeding will sometimes go away on its own. Your health care provider may choose to wait 24 hours after first seeing you to decide whether a prescription medicine is needed. If your symptoms are worse after 24 hours, your health care provider will likely prescribe an antibiotic medicine to treat the mastitis. He or she will determine which bacteria are most likely causing the infection and will then select an appropriate antibiotic medicine. This is sometimes changed based on the results of tests performed to identify the bacteria, or if there is no response to the antibiotic medicine selected. Antibiotic medicines are usually given by mouth. You may also be given medicine for pain. °HOME CARE INSTRUCTIONS °· Only take over-the-counter or prescription medicines for pain, fever, or discomfort as directed by your health care provider. °· If your health care provider prescribed an antibiotic medicine, take the medicine as directed. Make sure you finish it even if you start to feel better. °· Do not wear a   tight or underwire bra. Wear a soft, supportive bra. °· Increase your fluid intake, especially if you have a fever. °· Continue to empty the breast. Your health care provider can tell you whether this milk is safe for your infant or needs to be thrown out. You may be told to stop nursing until your health care  provider thinks it is safe for your baby. Use a breast pump if you are advised to stop nursing. °· Keep your nipples clean and dry. °· Empty the first breast completely before going to the other breast. If your baby is not emptying your breasts completely for some reason, use a breast pump to empty your breasts. °· If you go back to work, pump your breasts while at work to stay in time with your nursing schedule. °· Avoid allowing your breasts to become overly filled with milk (engorged). °SEEK MEDICAL CARE IF: °· You have pus-like discharge from the breast. °· Your symptoms do not improve with the treatment prescribed by your health care provider within 2 days. °SEEK IMMEDIATE MEDICAL CARE IF: °· Your pain and swelling are getting worse. °· You have pain that is not controlled with medicine. °· You have a red line extending from the breast toward your armpit. °· You have a fever or persistent symptoms for more than 2-3 days. °· You have a fever and your symptoms suddenly get worse. °MAKE SURE YOU:  °· Understand these instructions. °· Will watch your condition. °· Will get help right away if you are not doing well or get worse. °  °This information is not intended to replace advice given to you by your health care provider. Make sure you discuss any questions you have with your health care provider. °  °Document Released: 12/08/2004 Document Revised: 08/18/2013 Document Reviewed: 03/19/2013 °[employer]vier Interactive Patient Education ©2016 [employer] °Breastfeeding °Deciding to breastfeed is one of the best choices you can make for you and your baby. A change in hormones during pregnancy causes your breast tissue to grow and increases the number and size of your milk ducts. These hormones also allow proteins, sugars, and fats from your blood supply to make breast milk in your milk-producing glands. Hormones prevent breast milk from being released before your baby is born as well as prompt milk flow after birth. Once  breastfeeding has begun, thoughts of your baby, as well as his or her sucking or crying, can stimulate the release of milk from your milk-producing glands.  °BENEFITS OF BREASTFEEDING °For Your Baby °· Your first milk (colostrum) helps your baby's digestive system function better. °· There are antibodies in your milk that help your baby fight off infections. °· Your baby has a lower incidence of asthma, allergies, and sudden infant death syndrome. °· The nutrients in breast milk are better for your baby than infant formulas and are designed uniquely for your baby's needs. °· Breast milk improves your baby's brain development. °· Your baby is less likely to develop other conditions, such as childhood obesity, asthma, or type 2 diabetes mellitus. °For You °· Breastfeeding helps to create a very special bond between you and your baby. °· Breastfeeding is convenient. Breast milk is always available at the correct temperature and costs nothing. °· Breastfeeding helps to burn calories and helps you lose the weight gained during pregnancy. °· Breastfeeding makes your uterus contract to its prepregnancy size faster and slows bleeding (lochia) after you give birth.   °· Breastfeeding helps to lower your risk of developing type   2 diabetes mellitus, osteoporosis, and breast or ovarian cancer later in life. °SIGNS THAT YOUR BABY IS HUNGRY °Early Signs of Hunger °· Increased alertness or activity. °· Stretching. °· Movement of the head from side to side. °· Movement of the head and opening of the mouth when the corner of the mouth or cheek is stroked (rooting). °· Increased sucking sounds, smacking lips, cooing, sighing, or squeaking. °· Hand-to-mouth movements. °· Increased sucking of fingers or hands. °Late Signs of Hunger °· Fussing. °· Intermittent crying. °Extreme Signs of Hunger °Signs of extreme hunger will require calming and consoling before your baby will be able to breastfeed successfully. Do not wait for the  following signs of extreme hunger to occur before you initiate breastfeeding: °· Restlessness. °· A loud, strong cry. °· Screaming. °BREASTFEEDING BASICS °Breastfeeding Initiation °· Find a comfortable place to sit or lie down, with your neck and back well supported. °· Place a pillow or rolled up blanket under your baby to bring him or her to the level of your breast (if you are seated). Nursing pillows are specially designed to help support your arms and your baby while you breastfeed. °· Make sure that your baby's abdomen is facing your abdomen. °· Gently massage your breast. With your fingertips, massage from your chest wall toward your nipple in a circular motion. This encourages milk flow. You may need to continue this action during the feeding if your milk flows slowly. °· Support your breast with 4 fingers underneath and your thumb above your nipple. Make sure your fingers are well away from your nipple and your baby's mouth. °· Stroke your baby's lips gently with your finger or nipple. °· When your baby's mouth is open wide enough, quickly bring your baby to your breast, placing your entire nipple and as much of the colored area around your nipple (areola) as possible into your baby's mouth. °· More areola should be visible above your baby's upper lip than below the lower lip. °· Your baby's tongue should be between his or her lower gum and your breast. °· Ensure that your baby's mouth is correctly positioned around your nipple (latched). Your baby's lips should create a seal on your breast and be turned out (everted). °· It is common for your baby to suck about 2-3 minutes in order to start the flow of breast milk. °Latching °Teaching your baby how to latch on to your breast properly is very important. An improper latch can cause nipple pain and decreased milk supply for you and poor weight gain in your baby. Also, if your baby is not latched onto your nipple properly, he or she may swallow some air during  feeding. This can make your baby fussy. Burping your baby when you switch breasts during the feeding can help to get rid of the air. However, teaching your baby to latch on properly is still the best way to prevent fussiness from swallowing air while breastfeeding. °Signs that your baby has successfully latched on to your nipple: °· Silent tugging or silent sucking, without causing you pain. °· Swallowing heard between every 3-4 sucks. °· Muscle movement above and in front of his or her ears while sucking. °Signs that your baby has not successfully latched on to nipple: °· Sucking sounds or smacking sounds from your baby while breastfeeding. °· Nipple pain. °If you think your baby has not latched on correctly, slip your finger into the corner of your baby's mouth to break the suction and place it   between your baby's gums. Attempt breastfeeding initiation again. °Signs of Successful Breastfeeding °Signs from your baby: °· A gradual decrease in the number of sucks or complete cessation of sucking. °· Falling asleep. °· Relaxation of his or her body. °· Retention of a small amount of milk in his or her mouth. °· Letting go of your breast by himself or herself. °Signs from you: °· Breasts that have increased in firmness, weight, and size 1-3 hours after feeding. °· Breasts that are softer immediately after breastfeeding. °· Increased milk volume, as well as a change in milk consistency and color by the fifth day of breastfeeding. °· Nipples that are not sore, cracked, or bleeding. °Signs That Your Baby is Getting Enough Milk °· Wetting at least 3 diapers in a 24-hour period. The urine should be clear and pale yellow by age 5 days. °· At least 3 stools in a 24-hour period by age 5 days. The stool should be soft and yellow. °· At least 3 stools in a 24-hour period by age 7 days. The stool should be seedy and yellow. °· No loss of weight greater than 10% of birth weight during the first 3 days of age. °· Average weight  gain of 4-7 ounces (113-198 g) per week after age 4 days. °· Consistent daily weight gain by age 5 days, without weight loss after the age of 2 weeks. °After a feeding, your baby may spit up a small amount. This is common. °BREASTFEEDING FREQUENCY AND DURATION °Frequent feeding will help you make more milk and can prevent sore nipples and breast engorgement. Breastfeed when you feel the need to reduce the fullness of your breasts or when your baby shows signs of hunger. This is called "breastfeeding on demand." Avoid introducing a pacifier to your baby while you are working to establish breastfeeding (the first 4-6 weeks after your baby is born). After this time you may choose to use a pacifier. Research has shown that pacifier use during the first year of a baby's life decreases the risk of sudden infant death syndrome (SIDS). °Allow your baby to feed on each breast as long as he or she wants. Breastfeed until your baby is finished feeding. When your baby unlatches or falls asleep while feeding from the first breast, offer the second breast. Because newborns are often sleepy in the first few weeks of life, you may need to awaken your baby to get him or her to feed. °Breastfeeding times will vary from baby to baby. However, the following rules can serve as a guide to help you ensure that your baby is properly fed: °· Newborns (babies 4 weeks of age or younger) may breastfeed every 1-3 hours. °· Newborns should not go longer than 3 hours during the day or 5 hours during the night without breastfeeding. °· You should breastfeed your baby a minimum of 8 times in a 24-hour period until you begin to introduce solid foods to your baby at around 6 months of age. °BREAST MILK PUMPING °Pumping and storing breast milk allows you to ensure that your baby is exclusively fed your breast milk, even at times when you are unable to breastfeed. This is especially important if you are going back to work while you are still  breastfeeding or when you are not able to be present during feedings. Your lactation consultant can give you guidelines on how long it is safe to store breast milk. °A breast pump is a machine that allows you to pump milk   from your breast into a sterile bottle. The pumped breast milk can then be stored in a refrigerator or freezer. Some breast pumps are operated by hand, while others use electricity. Ask your lactation consultant which type will work best for you. Breast pumps can be purchased, but some hospitals and breastfeeding support groups lease breast pumps on a monthly basis. A lactation consultant can teach you how to hand express breast milk, if you prefer not to use a pump. °CARING FOR YOUR BREASTS WHILE YOU BREASTFEED °Nipples can become dry, cracked, and sore while breastfeeding. The following recommendations can help keep your breasts moisturized and healthy: °· Avoid using soap on your nipples. °· Wear a supportive bra. Although not required, special nursing bras and tank tops are designed to allow access to your breasts for breastfeeding without taking off your entire bra or top. Avoid wearing underwire-style bras or extremely tight bras. °· Air dry your nipples for 3-4 minutes after each feeding. °· Use only cotton bra pads to absorb leaked breast milk. Leaking of breast milk between feedings is normal. °· Use lanolin on your nipples after breastfeeding. Lanolin helps to maintain your skin's normal moisture barrier. If you use pure lanolin, you do not need to wash it off before feeding your baby again. Pure lanolin is not toxic to your baby. You may also hand express a few drops of breast milk and gently massage that milk into your nipples and allow the milk to air dry. °In the first few weeks after giving birth, some women experience extremely full breasts (engorgement). Engorgement can make your breasts feel heavy, warm, and tender to the touch. Engorgement peaks within 3-5 days after you give  birth. The following recommendations can help ease engorgement: °· Completely empty your breasts while breastfeeding or pumping. You may want to start by applying warm, moist heat (in the shower or with warm water-soaked hand towels) just before feeding or pumping. This increases circulation and helps the milk flow. If your baby does not completely empty your breasts while breastfeeding, pump any extra milk after he or she is finished. °· Wear a snug bra (nursing or regular) or tank top for 1-2 days to signal your body to slightly decrease milk production. °· Apply ice packs to your breasts, unless this is too uncomfortable for you. °· Make sure that your baby is latched on and positioned properly while breastfeeding. °If engorgement persists after 48 hours of following these recommendations, contact your health care provider or a lactation consultant. °OVERALL HEALTH CARE RECOMMENDATIONS WHILE BREASTFEEDING °· Eat healthy foods. Alternate between meals and snacks, eating 3 of each per day. Because what you eat affects your breast milk, some of the foods may make your baby more irritable than usual. Avoid eating these foods if you are sure that they are negatively affecting your baby. °· Drink milk, fruit juice, and water to satisfy your thirst (about 10 glasses a day). °· Rest often, relax, and continue to take your prenatal vitamins to prevent fatigue, stress, and anemia. °· Continue breast self-awareness checks. °· Avoid chewing and smoking tobacco. Chemicals from cigarettes that pass into breast milk and exposure to secondhand smoke may harm your baby. °· Avoid alcohol and drug use, including marijuana. °Some medicines that may be harmful to your baby can pass through breast milk. It is important to ask your health care provider before taking any medicine, including all over-the-counter and prescription medicine as well as vitamin and herbal supplements. °It is possible to become   pregnant while breastfeeding. If  birth control is desired, ask your health care provider about options that will be safe for your baby. °SEEK MEDICAL CARE IF: °· You feel like you want to stop breastfeeding or have become frustrated with breastfeeding. °· You have painful breasts or nipples. °· Your nipples are cracked or bleeding. °· Your breasts are red, tender, or warm. °· You have a swollen area on either breast. °· You have a fever or chills. °· You have nausea or vomiting. °· You have drainage other than breast milk from your nipples. °· Your breasts do not become full before feedings by the fifth day after you give birth. °· You feel sad and depressed. °· Your baby is too sleepy to eat well. °· Your baby is having trouble sleeping.   °· Your baby is wetting less than 3 diapers in a 24-hour period. °· Your baby has less than 3 stools in a 24-hour period. °· Your baby's skin or the white part of his or her eyes becomes yellow.   °· Your baby is not gaining weight by 5 days of age. °SEEK IMMEDIATE MEDICAL CARE IF: °· Your baby is overly tired (lethargic) and does not want to wake up and feed. °· Your baby develops an unexplained fever. °  °This information is not intended to replace advice given to you by your health care provider. Make sure you discuss any questions you have with your health care provider. °  °Document Released: 08/13/2005 Document Revised: 05/04/2015 Document Reviewed: 02/04/2013 °Elsevier Interactive Patient Education ©2016 [employer] ° °

## 2016-02-07 ENCOUNTER — Telehealth: Payer: Self-pay | Admitting: Medical

## 2016-02-07 MED ORDER — AZITHROMYCIN 250 MG PO TABS: ORAL_TABLET | ORAL | Status: DC

## 2016-02-07 MED FILL — AZITHROMYCIN 250 MG TABLET: 250 | 5 days supply | Qty: 6 | Fill #0

## 2016-02-07 NOTE — Telephone Encounter (Signed)
Pt husband had strep + yesterday. So I did send in azithromycin since she has st.

## 2016-02-07 NOTE — Telephone Encounter (Signed)
Spoke with pt's husband and advised him that the Rx had been sent to the pharmacy and per verbal from E. Saguier, there was not a warning that popped up but he wanted the pt to check with the pharmacy before taking the medication. Pt's husband voices understanding. He did not have any further questions.

## 2016-02-07 NOTE — Telephone Encounter (Signed)
Please advise. Pt was last seen 01/07/15.

## 2016-02-07 NOTE — Telephone Encounter (Signed)
Caller name:Andrew Relationship to patient: husband Can be reached: [phone] Pharmacy: [facility], [place]  Reason for call:  Pt is breastfeeding but having sx of sore throat and husband dx yesterday with strep. He is asking that med be sent in for her.

## 2016-02-16 DIAGNOSIS — Z1151 Encounter for screening for human papillomavirus (HPV): Secondary | ICD-10-CM | POA: Diagnosis not present

## 2016-02-16 MED FILL — NORETHINDRONE 0.35 MG TAB: 0.35 | 28 days supply | Qty: 28 | Fill #0 | Status: TO

## 2016-05-25 MED FILL — NORETHINDRONE 0.35 MG TAB: 0.35 | 84 days supply | Qty: 84 | Fill #0

## 2016-08-22 MED FILL — NORETHINDRONE 0.35 MG TAB: 0.35 | 84 days supply | Qty: 84 | Fill #1

## 2016-11-08 ENCOUNTER — Encounter: Payer: Self-pay | Admitting: Medical

## 2016-11-08 ENCOUNTER — Ambulatory Visit (INDEPENDENT_AMBULATORY_CARE_PROVIDER_SITE_OTHER): Payer: 59 | Admitting: Medical

## 2016-11-08 VITALS — BP 116/81 | HR 80 | Temp 98.1°F | Resp 16 | Ht 69.0 in | Wt 157.0 lb

## 2016-11-08 DIAGNOSIS — R399 Unspecified symptoms and signs involving the genitourinary system: Secondary | ICD-10-CM | POA: Diagnosis not present

## 2016-11-08 DIAGNOSIS — R300 Dysuria: Secondary | ICD-10-CM | POA: Diagnosis not present

## 2016-11-08 LAB — POC URINALSYSI DIPSTICK (AUTOMATED)
Bilirubin, UA: NEGATIVE
Blood, UA: NEGATIVE
Glucose, UA: NEGATIVE
Ketones, UA: NEGATIVE
Leukocytes, UA: NEGATIVE
Nitrite, UA: NEGATIVE
Protein, UA: NEGATIVE
Spec Grav, UA: >1.035 — AB
Urobilinogen, UA: 0.2
pH, UA: 6

## 2016-11-08 MED ORDER — CEPHALEXIN 500 MG PO CAPS: 500.0000 mg | ORAL_CAPSULE | Freq: Two times a day (BID) | ORAL | 0 refills | Status: DC

## 2016-11-08 MED FILL — CEPHALEXIN 500 MG CAPSULE: 500 | 7 days supply | Qty: 14 | Fill #0

## 2016-11-08 MED FILL — NORETHINDRONE 0.35 MG TAB: 0.35 | 84 days supply | Qty: 84 | Fill #0

## 2016-11-08 NOTE — Progress Notes (Signed)
Pre visit review using our clinic review tool, if applicable. No additional management support is needed unless otherwise documented below in the visit note/SLS  

## 2016-11-08 NOTE — Patient Instructions (Addendum)
You appear to have a probable urinary tract infection. I am prescribing  Cephalexin antibiotic for the probable infection. Hydrate well. I am sending out a urine culture. During the interim if your signs and symptoms worsen rather than improving please notify us. We will notify your when the culture results are back.  Follow up in 7 days or as needed.

## 2016-11-08 NOTE — Progress Notes (Signed)
Subjective:    Patient ID: Karen Powers, female    DOB: 1980-12-02, 36 y.o.   MRN: [id number]  HPI    Pt delivered child Jan 05, 2016. Child breast feeding.  Pt in today reporting urinary symptoms for 4 days.  Dysuria-  Some.  Frequent urination- moderately.  Hesitancy-no Suprapubic pressure- yes Fever- chills-no Nausea-no Vomiting-no CVA pain-no History of UTI-no Gross hematuria-no     Review of Systems  Constitutional: Negative for chills, fatigue and fever.  Respiratory: Negative for chest tightness, shortness of breath and wheezing.   Cardiovascular: Negative for chest pain and palpitations.  Gastrointestinal: Negative for abdominal distention, anal bleeding, blood in stool, constipation, nausea and vomiting.       Fiant suprapubic tenderness.  Genitourinary: Positive for dysuria and frequency. Negative for decreased urine volume, difficulty urinating, flank pain, menstrual problem ( ), pelvic pain and vaginal pain.  Musculoskeletal: Negative for back pain, gait problem, joint swelling and neck pain.  Skin: Negative for rash.  Hematological: Negative for adenopathy. Does not bruise/bleed easily.  Psychiatric/Behavioral: Negative for behavioral problems and confusion.    Past Medical History:  Diagnosis Date  . Acute blood loss anemia 01/06/2016  . Anemia   . Diabetes mellitus without complication ([place]) 4166   Gestational diabetes only.  Marland Kitchen Dysrhythmia   . Gestational diabetes    2nd preg only  . Headache(784.0)    otc med prn  . Heartburn in pregnancy   . Maternal iron deficiency anemia 01/06/2016  . MS (multiple sclerosis) ([place])   . Neuromuscular disorder ([place])    MS  . Postpartum care following cesarean delivery (5/11) 01/05/2016  . POTS (postural orthostatic tachycardia syndrome)   . SVT (supraventricular tachycardia) ([place])    No problems since 10/2012 - no med needed  . Vaginal Pap smear, abnormal   . Vitamin deficiency      Social History    Social History  . Marital status: Single    Spouse name: N/A  . Number of children: N/A  . Years of education: N/A   Occupational History  . Not on file.   Social History Main Topics  . Smoking status: Never Smoker  . Smokeless tobacco: Never Used  . Alcohol use No     Comment: occasional  . Drug use: No  . Sexual activity: Yes    Birth control/ protection: None     Comment: pregnant   Other Topics Concern  . Not on file   Social History Narrative   ** Merged History Encounter **        Past Surgical History:  Procedure Laterality Date  . ATRIAL ABLATION SURGERY  2003   FAILED  . CESAREAN SECTION  2006   Breech  . CESAREAN SECTION  2006   breech  . CESAREAN SECTION N/A 12/19/2013   Procedure: REPEAT CESAREAN SECTION;  Surgeon: Lovenia Kim, MD;  Location: [facility];  Service: Obstetrics;  Laterality: N/A;  EDD: 12/25/13  . CESAREAN SECTION N/A 01/05/2016   Procedure: Repeat CESAREAN SECTION;  Surgeon: Brien Few, MD;  Location: [facility];  Service: Obstetrics;  Laterality: N/A;  EDD: 01/12/16   . COLPOSCOPY    . WISDOM TOOTH EXTRACTION      Family History  Problem Relation Age of Onset  . Multiple sclerosis Mother   . Migraines Mother   . Emphysema Father   . Heart disease Father   . Migraines Father   . Migraines Sister   . Spina  bifida Brother     half brother  . Cancer Paternal Grandfather     LUNG  . Diabetes Maternal Grandmother     Allergies  Allergen Reactions  . Paxil [Paroxetine Hcl]   . Ciprofloxacin Rash    Has patient had a PCN reaction causing immediate rash, facial/tongue/throat swelling, SOB or lightheadedness with hypotension: No Has patient had a PCN reaction causing severe rash involving mucus membranes or skin necrosis: No Has patient had a PCN reaction that required hospitalization No Has patient had a PCN reaction occurring within the last 10 years: Yes If all of the above answers are "NO", then may proceed with  Cephalosporin use.    Current Outpatient Prescriptions on File Prior to Visit  Medication Sig Dispense Refill  . acetaminophen (TYLENOL) 500 MG tablet Take 1,000 mg by mouth every 6 (six) hours as needed for headache.    . ibuprofen (ADVIL,MOTRIN) 600 MG tablet Take 1 tablet (600 mg total) by mouth every 6 (six) hours as needed for mild pain. 30 tablet 0  . loratadine (CLARITIN) 10 MG tablet Take 10 mg by mouth daily as needed for allergies.     No current facility-administered medications on file prior to visit.     BP 116/81 (BP Location: Left Arm, Patient Position: Sitting, Cuff Size: Normal)   Pulse 80   Temp 98.1 F (36.7 C) (Oral)   Resp 16   Ht 5\' 9"  (1.753 m)   Wt 157 lb (71.2 kg)   SpO2 100%   BMI 23.18 kg/m       Objective:   Physical Exam  General Appearance- Not in acute distress.  HEENT Eyes- Scleraeral/Conjuntiva-bilat- Not Yellow. Mouth & Throat- Normal.  Chest and Lung Exam Auscultation: Breath sounds:-Normal. Adventitious sounds:- No Adventitious sounds.  Cardiovascular Auscultation:Rythm - Regular. Heart Sounds -Normal heart sounds.  Abdomen Inspection:-Inspection Normal.  Palpation/Perucssion: Palpation and Percussion of the abdomen reveal- faint suprapubic Tenderness, No Rebound tenderness, No rigidity( no Guarding) and No Palpable abdominal masses.  Liver:-Normal.  Spleen:- Normal.   Back- no cva tenderness.        Assessment & Plan:  You appear to have a probable  urinary tract infection. I am prescribing  Cephalexin antibiotic for the probable infection. Hydrate well. I am sending out a urine culture. During the interim if your signs and symptoms worsen rather than improving please notify us. We will notify your when the culture results are back.  Follow up in 7 days or as needed.  Rhondalyn Clingan, Percell Miller, PA-C

## 2016-11-11 ENCOUNTER — Telehealth: Payer: Self-pay | Admitting: Medical

## 2016-11-11 LAB — CULTURE, URINE COMPREHENSIVE

## 2016-11-11 MED ORDER — NITROFURANTOIN MONOHYD MACRO 100 MG PO CAPS: 100.0000 mg | ORAL_CAPSULE | Freq: Two times a day (BID) | ORAL | 0 refills | Status: DC

## 2016-11-12 NOTE — Telephone Encounter (Signed)
Opened to rx meds. Write script.

## 2016-11-15 ENCOUNTER — Encounter: Payer: Self-pay | Admitting: Medical

## 2016-11-15 DIAGNOSIS — Z0000 Encounter for general adult medical examination without abnormal findings: Secondary | ICD-10-CM

## 2016-11-22 MED FILL — NITROFURANTOIN MONO-MCR 100: 100 | 7 days supply | Qty: 14 | Fill #0

## 2016-12-05 ENCOUNTER — Ambulatory Visit (INDEPENDENT_AMBULATORY_CARE_PROVIDER_SITE_OTHER): Payer: 59 | Admitting: Medical

## 2016-12-05 ENCOUNTER — Encounter: Payer: Self-pay | Admitting: Medical

## 2016-12-05 VITALS — BP 106/69 | HR 69 | Temp 98.0°F | Resp 16 | Ht 69.0 in | Wt 152.6 lb

## 2016-12-05 DIAGNOSIS — Z0000 Encounter for general adult medical examination without abnormal findings: Secondary | ICD-10-CM

## 2016-12-05 LAB — LIPID PANEL
Cholesterol: 167 mg/dL (ref 0–200)
HDL: 60 mg/dL (ref >39.00)
LDL Cholesterol: 99 mg/dL (ref 0–99)
NonHDL: 107.2
Total CHOL/HDL Ratio: 3
Triglycerides: 40 mg/dL (ref 0.0–149.0)
VLDL: 8 mg/dL (ref 0.0–40.0)

## 2016-12-05 LAB — CBC WITH DIFFERENTIAL/PLATELET
Basophils Absolute: 0 10*3/uL (ref 0.0–0.1)
Basophils Relative: 0.3 % (ref 0.0–3.0)
Eosinophils Absolute: 0.2 10*3/uL (ref 0.0–0.7)
Eosinophils Relative: 2.3 % (ref 0.0–5.0)
HCT: 38.9 % (ref 36.0–46.0)
Hemoglobin: 13.5 g/dL (ref 12.0–15.0)
Lymphocytes Relative: 33.5 % (ref 12.0–46.0)
Lymphs Abs: 2.3 10*3/uL (ref 0.7–4.0)
MCHC: 34.7 g/dL (ref 30.0–36.0)
MCV: 91 fl (ref 78.0–100.0)
Monocytes Absolute: 0.4 10*3/uL (ref 0.1–1.0)
Monocytes Relative: 5.7 % (ref 3.0–12.0)
Neutro Abs: 4 10*3/uL (ref 1.4–7.7)
Neutrophils Relative %: 58.2 % (ref 43.0–77.0)
Platelets: 223 10*3/uL (ref 150.0–400.0)
RBC: 4.28 Mil/uL (ref 3.87–5.11)
RDW: 12.8 % (ref 11.5–15.5)
WBC: 6.9 10*3/uL (ref 4.0–10.5)

## 2016-12-05 LAB — COMPREHENSIVE METABOLIC PANEL
ALT: 12 U/L (ref 0–35)
AST: 15 U/L (ref 0–37)
Albumin: 4.5 g/dL (ref 3.5–5.2)
Alkaline Phosphatase: 74 U/L (ref 39–117)
BUN: 12 mg/dL (ref 6–23)
CO2: 26 mEq/L (ref 19–32)
Calcium: 9.2 mg/dL (ref 8.4–10.5)
Chloride: 104 mEq/L (ref 96–112)
Creatinine, Ser: 0.7 mg/dL (ref 0.40–1.20)
GFR: 100.91 mL/min (ref >60.00)
Glucose, Bld: 97 mg/dL (ref 70–99)
Potassium: 4.2 mEq/L (ref 3.5–5.1)
Sodium: 139 mEq/L (ref 135–145)
Total Bilirubin: 0.6 mg/dL (ref 0.2–1.2)
Total Protein: 7.1 g/dL (ref 6.0–8.3)

## 2016-12-05 LAB — POC URINALSYSI DIPSTICK (AUTOMATED)
Bilirubin, UA: NEGATIVE
Blood, UA: NEGATIVE
Glucose, UA: NEGATIVE
Ketones, UA: NEGATIVE
Leukocytes, UA: NEGATIVE — AB
Nitrite, UA: NEGATIVE
Protein, UA: NEGATIVE
Spec Grav, UA: 1.025 (ref 1.010–1.025)
Urobilinogen, UA: NEGATIVE E.U./dL — AB
pH, UA: 6 (ref 5.0–8.0)

## 2016-12-05 LAB — TSH: TSH: 1.24 u[IU]/mL (ref 0.35–4.50)

## 2016-12-05 NOTE — Progress Notes (Signed)
Pre visit review using our clinic review tool, if applicable. No additional management support is needed unless otherwise documented below in the visit note. 

## 2016-12-05 NOTE — Progress Notes (Signed)
Subjective:    Patient ID: Karen Powers, female    DOB: September 15, 1980, 36 y.o.   MRN: [id number]  HPI  Pt in for physical exam.  Pt up to date on tdap and hiv screen.   Pt is up to date on her papsmear. Pt pap with gyn due this year.   No fh breast CA.  Pt not exercising. Pt feels like not eating real healthy. She admits could improve. She drinks sodas a lot at work. Married- 3 children.  Pt update me that she is nursing. Still seeing neurologist regular basis for her MS.  Pt has seen dermatologist. Saw them 2 years ago. One mole on back and one on breast both negative. No known family history of skin cancers,      Review of Systems  Constitutional: Negative for chills, fatigue and fever.  HENT: Negative for congestion, ear pain, facial swelling, postnasal drip, rhinorrhea, sinus pain and sinus pressure.   Respiratory: Negative for cough, choking, shortness of breath and wheezing.   Cardiovascular: Negative for chest pain and palpitations.  Gastrointestinal: Negative for abdominal pain, anal bleeding, nausea and vomiting.  Genitourinary: Negative for dysuria and flank pain.  Musculoskeletal: Negative for back pain and joint swelling.  Skin: Negative for rash.  Neurological: Negative for dizziness, weakness and headaches.  Psychiatric/Behavioral: Negative for behavioral problems.    Past Medical History:  Diagnosis Date  . Acute blood loss anemia 01/06/2016  . Anemia   . Diabetes mellitus without complication ([place]) 6712   Gestational diabetes only.  Marland Kitchen Dysrhythmia   . Gestational diabetes    2nd preg only  . Headache(784.0)    otc med prn  . Heartburn in pregnancy   . Maternal iron deficiency anemia 01/06/2016  . MS (multiple sclerosis) ([place])   . Neuromuscular disorder ([place])    MS  . Postpartum care following cesarean delivery (5/11) 01/05/2016  . POTS (postural orthostatic tachycardia syndrome)   . SVT (supraventricular tachycardia) ([place])    No problems since  10/2012 - no med needed  . Vaginal Pap smear, abnormal   . Vitamin deficiency      Social History   Social History  . Marital status: Single    Spouse name: N/A  . Number of children: N/A  . Years of education: N/A   Occupational History  . Not on file.   Social History Main Topics  . Smoking status: Never Smoker  . Smokeless tobacco: Never Used  . Alcohol use No     Comment: occasional  . Drug use: No  . Sexual activity: Yes    Birth control/ protection: None     Comment: pregnant   Other Topics Concern  . Not on file   Social History Narrative   ** Merged History Encounter **        Past Surgical History:  Procedure Laterality Date  . ATRIAL ABLATION SURGERY  2003   FAILED  . CESAREAN SECTION  2006   Breech  . CESAREAN SECTION  2006   breech  . CESAREAN SECTION N/A 12/19/2013   Procedure: REPEAT CESAREAN SECTION;  Surgeon: Lovenia Kim, MD;  Location: [facility];  Service: Obstetrics;  Laterality: N/A;  EDD: 12/25/13  . CESAREAN SECTION N/A 01/05/2016   Procedure: Repeat CESAREAN SECTION;  Surgeon: Brien Few, MD;  Location: [facility];  Service: Obstetrics;  Laterality: N/A;  EDD: 01/12/16   . COLPOSCOPY    . WISDOM TOOTH EXTRACTION  Family History  Problem Relation Age of Onset  . Multiple sclerosis Mother   . Migraines Mother   . Emphysema Father   . Heart disease Father   . Migraines Father   . Migraines Sister   . Spina bifida Brother     half brother  . Cancer Paternal Grandfather     LUNG  . Diabetes Maternal Grandmother     Allergies  Allergen Reactions  . Paxil [Paroxetine Hcl]   . Ciprofloxacin Rash    Has patient had a PCN reaction causing immediate rash, facial/tongue/throat swelling, SOB or lightheadedness with hypotension: No Has patient had a PCN reaction causing severe rash involving mucus membranes or skin necrosis: No Has patient had a PCN reaction that required hospitalization No Has patient had a PCN reaction  occurring within the last 10 years: Yes If all of the above answers are "NO", then may proceed with Cephalosporin use.    Current Outpatient Prescriptions on File Prior to Visit  Medication Sig Dispense Refill  . acetaminophen (TYLENOL) 500 MG tablet Take 1,000 mg by mouth every 6 (six) hours as needed for headache.    . ibuprofen (ADVIL,MOTRIN) 600 MG tablet Take 1 tablet (600 mg total) by mouth every 6 (six) hours as needed for mild pain. 30 tablet 0  . Multiple Vitamins-Minerals (WOMENS MULTIVITAMIN PLUS) TABS Take by mouth daily.    Marland Kitchen loratadine (CLARITIN) 10 MG tablet Take 10 mg by mouth daily as needed for allergies.     No current facility-administered medications on file prior to visit.     BP 106/69 (BP Location: Left Arm, Cuff Size: Normal)   Pulse 69   Temp 98 F (36.7 C) (Oral)   Resp 16   Ht 5\' 9"  (1.753 m)   Wt 152 lb 9.6 oz (69.2 kg)   SpO2 100%   BMI 22.54 kg/m       Objective:   Physical Exam  General Mental Status- Alert. General Appearance- Not in acute distress.   Skin General: Color- Normal Color. Moisture- Normal Moisture.  Neck Carotid Arteries- Normal color. Moisture- Normal Moisture. No carotid bruits. No JVD.  Chest and Lung Exam Auscultation: Breath Sounds:-Normal.  Cardiovascular Auscultation:Rythm- Regular. Murmurs & Other Heart Sounds:Auscultation of the heart reveals- No Murmurs.  Abdomen Inspection:-Inspeection Normal. Palpation/Percussion:Note:No mass. Palpation and Percussion of the abdomen reveal- Non Tender, Non Distended + BS, no rebound or guarding.    Neurologic Cranial Nerve exam:- CN III-XII intact(No nystagmus), symmetric smile. Drift Test:- No drift. Finger to Nose:- Normal/Intact Strength:- 5/5 equal and symmetric strength both upper and lower extremities.      Assessment & Plan:  For you wellness exam today I have ordered cbc, cmp, tsh, lipid panel,  and ua.  Vaccine not given as up to date.  Recommend  exercise and healthy diet.(reduce number of sodas)  We will let you know lab results as they come in.  Follow up with gynecologist and dermatologist as they recommend  If any suspicious finding on urine study will get lab to culture.  Follow up date appointment will be determined after lab review.   Tobey Lippard, Percell Miller, PA-C

## 2016-12-05 NOTE — Addendum Note (Signed)
Addended by: Peggyann Shoals on: 12/05/2016 11:48 AM   Modules accepted: Orders

## 2016-12-05 NOTE — Patient Instructions (Signed)
For you wellness exam today I have ordered cbc, cmp, tsh, lipid panel,  and ua.  Vaccine not given as up to date.  Recommend exercise and healthy diet.(reduce number of sodas)  We will let you know lab results as they come in.  Follow up with gynecologist and dermatologist as they recommend  If any suspicious finding on urine study will get lab to culture.  Follow up date appointment will be determined after lab review.    Preventive Care 18-39 Years, Female Preventive care refers to lifestyle choices and visits with your health care provider that can promote health and wellness. What does preventive care include?  A yearly physical exam. This is also called an annual well check.  Dental exams once or twice a year.  Routine eye exams. Ask your health care provider how often you should have your eyes checked.  Personal lifestyle choices, including:  Daily care of your teeth and gums.  Regular physical activity.  Eating a healthy diet.  Avoiding tobacco and drug use.  Limiting alcohol use.  Practicing safe sex.  Taking vitamin and mineral supplements as recommended by your health care provider. What happens during an annual well check? The services and screenings done by your health care provider during your annual well check will depend on your age, overall health, lifestyle risk factors, and family history of disease. Counseling  Your health care provider may ask you questions about your:  Alcohol use.  Tobacco use.  Drug use.  Emotional well-being.  Home and relationship well-being.  Sexual activity.  Eating habits.  Work and work environment.  Method of birth control.  Menstrual cycle.  Pregnancy history. Screening  You may have the following tests or measurements:  Height, weight, and BMI.  Diabetes screening. This is done by checking your blood sugar (glucose) after you have not eaten for a while (fasting).  Blood pressure.  Lipid and  cholesterol levels. These may be checked every 5 years starting at age 20.  Skin check.  Hepatitis C blood test.  Hepatitis B blood test.  Sexually transmitted disease (STD) testing.  BRCA-related cancer screening. This may be done if you have a family history of breast, ovarian, tubal, or peritoneal cancers.  Pelvic exam and Pap test. This may be done every 3 years starting at age 21. Starting at age 30, this may be done every 5 years if you have a Pap test in combination with an HPV test. Discuss your test results, treatment options, and if necessary, the need for more tests with your health care provider. Vaccines  Your health care provider may recommend certain vaccines, such as:  Influenza vaccine. This is recommended every year.  Tetanus, diphtheria, and acellular pertussis (Tdap, Td) vaccine. You may need a Td booster every 10 years.  Varicella vaccine. You may need this if you have not been vaccinated.  HPV vaccine. If you are 26 or younger, you may need three doses over 6 months.  Measles, mumps, and rubella (MMR) vaccine. You may need at least one dose of MMR. You may also need a second dose.  Pneumococcal 13-valent conjugate (PCV13) vaccine. You may need this if you have certain conditions and were not previously vaccinated.  Pneumococcal polysaccharide (PPSV23) vaccine. You may need one or two doses if you smoke cigarettes or if you have certain conditions.  Meningococcal vaccine. One dose is recommended if you are age 19-21 years and a first-year college student living in a residence hall, or if you   have one of several medical conditions. You may also need additional booster doses.  Hepatitis A vaccine. You may need this if you have certain conditions or if you travel or work in places where you may be exposed to hepatitis A.  Hepatitis B vaccine. You may need this if you have certain conditions or if you travel or work in places where you may be exposed to hepatitis  B.  Haemophilus influenzae type b (Hib) vaccine. You may need this if you have certain risk factors. Talk to your health care provider about which screenings and vaccines you need and how often you need them. This information is not intended to replace advice given to you by your health care provider. Make sure you discuss any questions you have with your health care provider. Document Released: 10/09/2001 Document Revised: 05/02/2016 Document Reviewed: 06/14/2015 Elsevier Interactive Patient Education  2017 Elsevier Inc.  

## 2017-02-06 MED FILL — NORETHINDRONE 0.35 MG TAB: 0.35 | 28 days supply | Qty: 28 | Fill #1

## 2017-03-08 MED FILL — NORETHINDRONE 0.35 MG TAB: 0.35 | 28 days supply | Qty: 28 | Fill #0

## 2017-03-13 DIAGNOSIS — Z01419 Encounter for gynecological examination (general) (routine) without abnormal findings: Secondary | ICD-10-CM | POA: Diagnosis not present

## 2017-03-13 DIAGNOSIS — Z6822 Body mass index (BMI) 22.0-22.9, adult: Secondary | ICD-10-CM | POA: Diagnosis not present

## 2017-04-01 ENCOUNTER — Encounter (HOSPITAL_BASED_OUTPATIENT_CLINIC_OR_DEPARTMENT_OTHER): Payer: Self-pay | Admitting: Emergency Medicine

## 2017-04-01 ENCOUNTER — Emergency Department (HOSPITAL_BASED_OUTPATIENT_CLINIC_OR_DEPARTMENT_OTHER)
Admission: EM | Admit: 2017-04-01 | Discharge: 2017-04-01 | Disposition: A | Discharge status: Home / Self Care | Payer: 59 | Attending: Emergency Medicine | Admitting: Emergency Medicine

## 2017-04-01 ENCOUNTER — Emergency Department (HOSPITAL_BASED_OUTPATIENT_CLINIC_OR_DEPARTMENT_OTHER): Payer: 59

## 2017-04-01 DIAGNOSIS — K5732 Diverticulitis of large intestine without perforation or abscess without bleeding: Secondary | ICD-10-CM | POA: Insufficient documentation

## 2017-04-01 DIAGNOSIS — K5792 Diverticulitis of intestine, part unspecified, without perforation or abscess without bleeding: Secondary | ICD-10-CM

## 2017-04-01 DIAGNOSIS — Z79899 Other long term (current) drug therapy: Secondary | ICD-10-CM | POA: Insufficient documentation

## 2017-04-01 DIAGNOSIS — R1031 Right lower quadrant pain: Secondary | ICD-10-CM | POA: Diagnosis not present

## 2017-04-01 LAB — COMPREHENSIVE METABOLIC PANEL
ALT: 14 U/L (ref 14–54)
AST: 14 U/L — ABNORMAL LOW (ref 15–41)
Albumin: 4.7 g/dL (ref 3.5–5.0)
Alkaline Phosphatase: 61 U/L (ref 38–126)
Anion gap: 10 (ref 5–15)
BUN: 5 mg/dL — ABNORMAL LOW (ref 6–20)
CO2: 23 mmol/L (ref 22–32)
Calcium: 8.3 mg/dL — ABNORMAL LOW (ref 8.9–10.3)
Chloride: 103 mmol/L (ref 101–111)
Creatinine, Ser: 0.7 mg/dL (ref 0.44–1.00)
GFR calc Af Amer: >60 mL/min (ref >60)
GFR calc non Af Amer: >60 mL/min (ref >60)
Glucose, Bld: 114 mg/dL — ABNORMAL HIGH (ref 65–99)
Potassium: 3.7 mmol/L (ref 3.5–5.1)
Sodium: 136 mmol/L (ref 135–145)
Total Bilirubin: 2.2 mg/dL — ABNORMAL HIGH (ref 0.3–1.2)
Total Protein: 7.7 g/dL (ref 6.5–8.1)

## 2017-04-01 LAB — CBC WITH DIFFERENTIAL/PLATELET
Basophils Absolute: 0 10*3/uL (ref 0.0–0.1)
Basophils Relative: 0 %
Eosinophils Absolute: 0.1 10*3/uL (ref 0.0–0.7)
Eosinophils Relative: 1 %
HCT: 37.5 % (ref 36.0–46.0)
Hemoglobin: 13.1 g/dL (ref 12.0–15.0)
Lymphocytes Relative: 12 %
Lymphs Abs: 1.6 10*3/uL (ref 0.7–4.0)
MCH: 32.1 pg (ref 26.0–34.0)
MCHC: 34.9 g/dL (ref 30.0–36.0)
MCV: 91.9 fL (ref 78.0–100.0)
Monocytes Absolute: 1.3 10*3/uL — ABNORMAL HIGH (ref 0.1–1.0)
Monocytes Relative: 10 %
Neutro Abs: 10.3 10*3/uL — ABNORMAL HIGH (ref 1.7–7.7)
Neutrophils Relative %: 77 %
Platelets: 169 10*3/uL (ref 150–400)
RBC: 4.08 MIL/uL (ref 3.87–5.11)
RDW: 12.7 % (ref 11.5–15.5)
WBC: 13.3 10*3/uL — ABNORMAL HIGH (ref 4.0–10.5)

## 2017-04-01 LAB — URINALYSIS, ROUTINE W REFLEX MICROSCOPIC
Bilirubin Urine: NEGATIVE
Glucose, UA: NEGATIVE mg/dL
Hgb urine dipstick: NEGATIVE
Ketones, ur: 15 mg/dL — AB
Leukocytes, UA: NEGATIVE
Nitrite: NEGATIVE
Protein, ur: NEGATIVE mg/dL
Specific Gravity, Urine: 1.005 (ref 1.005–1.030)
pH: 7 (ref 5.0–8.0)

## 2017-04-01 LAB — PREGNANCY, URINE: Preg Test, Ur: NEGATIVE

## 2017-04-01 MED ORDER — AMOXICILLIN-POT CLAVULANATE 875-125 MG PO TABS: 1.0000 | ORAL_TABLET | Freq: Two times a day (BID) | ORAL | 0 refills | Status: DC

## 2017-04-01 MED ORDER — SODIUM CHLORIDE 0.9 % IV BOLUS (SEPSIS)
1000.0000 mL | Freq: Once | INTRAVENOUS | Status: AC
  Administered 2017-04-01: 1000 mL via INTRAVENOUS

## 2017-04-01 MED ORDER — FLUCONAZOLE 150 MG PO TABS: 150.0000 mg | ORAL_TABLET | Freq: Every day | ORAL | 0 refills | Status: DC

## 2017-04-01 MED ORDER — ONDANSETRON HCL 4 MG PO TABS: 4.0000 mg | ORAL_TABLET | Freq: Four times a day (QID) | ORAL | 0 refills | Status: DC

## 2017-04-01 MED ORDER — IOPAMIDOL (ISOVUE-300) INJECTION 61%
100.0000 mL | Freq: Once | INTRAVENOUS | Status: AC | PRN
  Administered 2017-04-01: 100 mL via INTRAVENOUS

## 2017-04-01 MED FILL — NORETHINDRONE 0.35 MG TAB: 0.35 | 84 days supply | Qty: 84 | Fill #0

## 2017-04-01 MED FILL — ONDANSETRON HCL 4 MG TABLET: 4 | 3 days supply | Qty: 12 | Fill #0

## 2017-04-01 MED FILL — AMOX-CLAV 875-125 MG TABLET: 875-125 | 7 days supply | Qty: 14 | Fill #0

## 2017-04-01 MED FILL — FLUCONAZOLE 150 MG TABLET: 150 | 3 days supply | Qty: 2 | Fill #0

## 2017-04-01 NOTE — ED Triage Notes (Signed)
Pt c/o RLQ pain with fever since last pm

## 2017-04-01 NOTE — Discharge Instructions (Signed)
Medications: Augmentin, Zofran, Diflucan  Treatment: Take Augmentin twice daily for 7 days. Take Zofran every 6 hours as needed for nausea or vomiting. You can take ibuprofen or Tylenol as prescribed over-the-counter, as needed for your pain. If you develop signs of yeast infection, take one Diflucan and repeat in 72 hours if symptoms do not resolve.  Follow-up: Please follow-up with your doctor in 2-3 days for recheck. Please return to emergency department if you develop any new or worsening symptoms.

## 2017-04-01 NOTE — ED Provider Notes (Signed)
[facility] Provider Note   CSN: [id number] Arrival date & time: 04/01/17  5053     History   Chief Complaint Chief Complaint  Patient presents with  . Abdominal Pain    HPI Karen Powers is a 36 y.o. female with history of MS who presents with a one-day history of right lower quadrant pain, nausea, and decreased appetite. Patient has had associated fever. Patient's pain is nonradiating. It is constant. She denies any vomiting, diarrhea, bloody stools, vaginal bleeding or discharge, or urinary symptoms. She denies any concern for STD exposure. No medications taken prior to arrival.  HPI  Past Medical History:  Diagnosis Date  . Acute blood loss anemia 01/06/2016  . Anemia   . Diabetes mellitus without complication ([place]) 9767   Gestational diabetes only.  Marland Kitchen Dysrhythmia   . Gestational diabetes    2nd preg only  . Headache(784.0)    otc med prn  . Heartburn in pregnancy   . Maternal iron deficiency anemia 01/06/2016  . MS (multiple sclerosis) ([place])   . Neuromuscular disorder ([place])    MS  . Postpartum care following cesarean delivery (5/11) 01/05/2016  . POTS (postural orthostatic tachycardia syndrome)   . SVT (supraventricular tachycardia) ([place])    No problems since 10/2012 - no med needed  . Vaginal Pap smear, abnormal   . Vitamin deficiency     Patient Active Problem List   Diagnosis Date Noted  . Maternal iron deficiency anemia 01/06/2016  . Acute blood loss anemia 01/06/2016  . Postpartum care following cesarean delivery (5/11) 01/05/2016  . Wellness examination 09/28/2014  . Acute pharyngitis 09/08/2014  . Previous cesarean section 12/19/2013  . Multiple sclerosis ([place]) 09/07/2011  . Migraine 09/07/2011  . Paroxysmal SVT (supraventricular tachycardia) ([place]) 09/07/2011  . UTI (urinary tract infection) 09/07/2011    Past Surgical History:  Procedure Laterality Date  . ATRIAL ABLATION SURGERY  2003   FAILED  . CESAREAN SECTION  2006   Breech    . CESAREAN SECTION  2006   breech  . CESAREAN SECTION N/A 12/19/2013   Procedure: REPEAT CESAREAN SECTION;  Surgeon: Lovenia Kim, MD;  Location: [facility];  Service: Obstetrics;  Laterality: N/A;  EDD: 12/25/13  . CESAREAN SECTION N/A 01/05/2016   Procedure: Repeat CESAREAN SECTION;  Surgeon: Brien Few, MD;  Location: [facility];  Service: Obstetrics;  Laterality: N/A;  EDD: 01/12/16   . COLPOSCOPY    . WISDOM TOOTH EXTRACTION      OB History    Gravida Para Term Preterm AB Living   4 3 3   1 3    SAB TAB Ectopic Multiple Live Births   1     0 3       Home Medications    Prior to Admission medications   Medication Sig Start Date End Date Taking? Authorizing Provider  acetaminophen (TYLENOL) 500 MG tablet Take 1,000 mg by mouth every 6 (six) hours as needed for headache.    [provider]  amoxicillin-clavulanate (AUGMENTIN) 875-125 MG tablet Take 1 tablet by mouth every 12 (twelve) hours. 04/01/17   Frederica Kuster, PA-C  fluconazole (DIFLUCAN) 150 MG tablet Take 1 tablet (150 mg total) by mouth daily. Repeat in 72 hours if symptoms do not resolve. 04/01/17   Elverna Caffee, Bea Graff, PA-C  ibuprofen (ADVIL,MOTRIN) 600 MG tablet Take 1 tablet (600 mg total) by mouth every 6 (six) hours as needed for mild pain. 01/08/16   Laury Deep, CNM  loratadine (CLARITIN) 10 MG tablet Take 10 mg by mouth daily as needed for allergies.    [provider]  Multiple Vitamins-Minerals (WOMENS MULTIVITAMIN PLUS) TABS Take by mouth daily.    [provider]  norethindrone (HEATHER) 0.35 MG tablet Take 1 tablet by mouth daily.    [provider]  ondansetron (ZOFRAN) 4 MG tablet Take 1 tablet (4 mg total) by mouth every 6 (six) hours. 04/01/17   Frederica Kuster, PA-C    Family History Family History  Problem Relation Age of Onset  . Multiple sclerosis Mother   . Migraines Mother   . Emphysema Father   . Heart disease Father   . Migraines Father   .  Migraines Sister   . Spina bifida Brother        half brother  . Cancer Paternal Grandfather        LUNG  . Diabetes Maternal Grandmother     Social History Social History  Substance Use Topics  . Smoking status: Never Smoker  . Smokeless tobacco: Never Used  . Alcohol use No     Allergies   Paxil [paroxetine hcl] and Ciprofloxacin   Review of Systems Review of Systems  Constitutional: Positive for appetite change. Negative for chills and fever.  HENT: Negative for facial swelling and sore throat.   Respiratory: Negative for shortness of breath.   Cardiovascular: Negative for chest pain.  Gastrointestinal: Positive for abdominal pain and nausea. Negative for blood in stool, diarrhea and vomiting.  Genitourinary: Negative for dysuria, flank pain, pelvic pain, vaginal bleeding and vaginal discharge.  Musculoskeletal: Negative for back pain.  Skin: Negative for rash and wound.  Neurological: Negative for headaches.  Psychiatric/Behavioral: The patient is not nervous/anxious.      Physical Exam Updated Vital Signs BP 115/83   Pulse (!) 132   Temp 98.2 F (36.8 C) (Oral)   Resp 18   Ht 5\' 10"  (1.778 m)   Wt 70.3 kg (155 lb)   SpO2 100%   BMI 22.24 kg/m   Physical Exam  Constitutional: She appears well-developed and well-nourished. No distress.  HENT:  Head: Normocephalic and atraumatic.  Mouth/Throat: Oropharynx is clear and moist. No oropharyngeal exudate.  Eyes: Pupils are equal, round, and reactive to light. Conjunctivae are normal. Right eye exhibits no discharge. Left eye exhibits no discharge. No scleral icterus.  Neck: Normal range of motion. Neck supple. No thyromegaly present.  Cardiovascular: Normal rate, regular rhythm, normal heart sounds and intact distal pulses.  Exam reveals no gallop and no friction rub.   No murmur heard. Pulmonary/Chest: Effort normal and breath sounds normal. No stridor. No respiratory distress. She has no wheezes. She has no  rales.  Abdominal: Soft. Bowel sounds are normal. She exhibits no distension. There is tenderness in the right lower quadrant. There is no rebound, no guarding and negative Murphy's sign.    Musculoskeletal: She exhibits no edema.  Lymphadenopathy:    She has no cervical adenopathy.  Neurological: She is alert. Coordination normal.  Skin: Skin is warm and dry. No rash noted. She is not diaphoretic. No pallor.  Psychiatric: She has a normal mood and affect.  Nursing note and vitals reviewed.    ED Treatments / Results  Labs (all labs ordered are listed, but only abnormal results are displayed) Labs Reviewed  CBC WITH DIFFERENTIAL/PLATELET - Abnormal; Notable for the following:       Result Value   WBC 13.3 (*)    Neutro Abs  10.3 (*)    Monocytes Absolute 1.3 (*)    All other components within normal limits  COMPREHENSIVE METABOLIC PANEL - Abnormal; Notable for the following:    Glucose, Bld 114 (*)    BUN 5 (*)    Calcium 8.3 (*)    AST 14 (*)    Total Bilirubin 2.2 (*)    All other components within normal limits  URINALYSIS, ROUTINE W REFLEX MICROSCOPIC - Abnormal; Notable for the following:    Ketones, ur 15 (*)    All other components within normal limits  PREGNANCY, URINE    EKG  EKG Interpretation None       Radiology Ct Abdomen Pelvis W Contrast  Result Date: 04/01/2017 CLINICAL DATA:  Right lower quadrant pain with nausea and fever. EXAM: CT ABDOMEN AND PELVIS WITH CONTRAST TECHNIQUE: Multidetector CT imaging of the abdomen and pelvis was performed using the standard protocol following bolus administration of intravenous contrast. CONTRAST:  135mL ISOVUE-300 IOPAMIDOL (ISOVUE-300) INJECTION 61% COMPARISON:  CT scan dated 12/25/2012 FINDINGS: Lower chest: Normal. Hepatobiliary: No focal liver abnormality is seen. No gallstones, gallbladder wall thickening, or biliary dilatation. Pancreas: Unremarkable. No pancreatic ductal dilatation or surrounding inflammatory  changes. Spleen: Normal in size without focal abnormality. Adrenals/Urinary Tract: Adrenal glands are unremarkable. Kidneys are normal, without renal calculi, focal lesion, or hydronephrosis. Bladder is unremarkable. Stomach/Bowel: Acute focal diverticulitis of the mid ascending colon just below the inferior tip of the right lobe of the liver with extensive pericolonic soft tissue swelling, mucosal edema of the segment of the colon, and a small amount of fluid in the right pericolic gutter. No free air or definable abscess at this time. There is a single prominent inflamed diverticulum. The bowel otherwise appears normal including the terminal ileum and appendix. Vascular/Lymphatic: No significant vascular findings are present. No enlarged abdominal or pelvic lymph nodes. Reproductive: Uterus and right ovary are normal. Collapsing cyst on the left ovary. Small amount of free fluid in the pelvic cul de sac. Musculoskeletal: No acute or significant osseous findings. IMPRESSION: Acute focal diverticulitis of the mid ascending colon just below the inferior tip of the right lobe of the liver and just below the gallbladder. Prominent mucosal thickening in that segment of the colon as well as pericolonic soft tissue stranding with a small amount of fluid in the pericolic gutter. Electronically Signed   By: Lorriane Shire M.D.   On: 04/01/2017 11:30    Procedures Procedures (including critical care time)  Medications Ordered in ED Medications  sodium chloride 0.9 % bolus 1,000 mL (0 mLs Intravenous Stopped 04/01/17 1123)  iopamidol (ISOVUE-300) 61 % injection 100 mL (100 mLs Intravenous Contrast Given 04/01/17 1106)     Initial Impression / Assessment and Plan / ED Course  I have reviewed the triage vital signs and the nursing notes.  Pertinent labs & imaging results that were available during my care of the patient were reviewed by me and considered in my medical decision making (see chart for details).      Patient with acute diverticulitis. WBC shows 13.3. UA is negative. CMP within normal limits, except total bilirubin 2.2. CT abdomen and pelvis shows [Acute focal diverticulitis of the mid ascending colon just below the inferior tip of the right lobe of the liver and just below the gallbladder. Prominent mucosal thickening in that segment of the colon as well as pericolonic soft tissue stranding with a small amount of fluid in the pericolic gutter.] Patient not requesting pain medication  or antinausea medication throughout ED course. Patient given 1 L of fluids. Patient with tachycardia syndrome that is worse when she is tired and although patient's heart rate was elevated, she states this is normal for her when she has not gotten sleep. Patient was here working her shift until 7 AM and had not slept. We'll discharge patient home with follow-up to PCP. Patient experiences rash reaction with Cipro so we'll treat with Augmentin. Patient also given Zofran and Diflucan as needed. Patient offered pain medication, however she will opt for over-the-counter pain medication. Return precautions discussed. Patient understands and agrees with plan. Patient also evaluated by Dr. Laverta Baltimore who agrees with plan.  Final Clinical Impressions(s) / ED Diagnoses   Final diagnoses:  Diverticulitis    New Prescriptions Discharge Medication List as of 04/01/2017 12:03 PM    START taking these medications   Details  amoxicillin-clavulanate (AUGMENTIN) 875-125 MG tablet Take 1 tablet by mouth every 12 (twelve) hours., Starting Mon 04/01/2017, Print    fluconazole (DIFLUCAN) 150 MG tablet Take 1 tablet (150 mg total) by mouth daily. Repeat in 72 hours if symptoms do not resolve., Starting Mon 04/01/2017, Print    ondansetron (ZOFRAN) 4 MG tablet Take 1 tablet (4 mg total) by mouth every 6 (six) hours., Starting Mon 04/01/2017, [address] [place], PA-C 04/01/17 1639    Margette Fast, MD 04/01/17 [phone]

## 2017-04-01 NOTE — ED Notes (Signed)
Pt returned from CT °

## 2017-05-22 MED FILL — NORG-ETHIN ESTRA 0.25-0.035: 0.25-35 | 84 days supply | Qty: 84 | Fill #0

## 2017-06-26 DIAGNOSIS — G35 Multiple sclerosis: Secondary | ICD-10-CM | POA: Diagnosis not present

## 2017-06-26 MED FILL — MODAFINIL 100 MG TABLET: 100 | 30 days supply | Qty: 30 | Fill #0

## 2017-07-05 ENCOUNTER — Other Ambulatory Visit (HOSPITAL_BASED_OUTPATIENT_CLINIC_OR_DEPARTMENT_OTHER): Payer: Self-pay | Admitting: Neurology

## 2017-07-05 DIAGNOSIS — G35 Multiple sclerosis: Secondary | ICD-10-CM

## 2017-07-08 MED FILL — LORazepam 2 MG TABS: 2 | 1 days supply | Qty: 2 | Fill #0

## 2017-07-13 ENCOUNTER — Ambulatory Visit (HOSPITAL_BASED_OUTPATIENT_CLINIC_OR_DEPARTMENT_OTHER)
Admission: RE | Admit: 2017-07-13 | Discharge: 2017-07-13 | Disposition: A | Discharge status: Home / Self Care | Payer: 59 | Source: Ambulatory Visit | Attending: Neurology | Admitting: Neurology

## 2017-07-13 DIAGNOSIS — R51 Headache: Secondary | ICD-10-CM | POA: Diagnosis not present

## 2017-07-13 DIAGNOSIS — M50322 Other cervical disc degeneration at C5-C6 level: Secondary | ICD-10-CM | POA: Diagnosis not present

## 2017-07-13 DIAGNOSIS — G35 Multiple sclerosis: Secondary | ICD-10-CM | POA: Insufficient documentation

## 2017-07-13 DIAGNOSIS — M5020 Other cervical disc displacement, unspecified cervical region: Secondary | ICD-10-CM | POA: Diagnosis not present

## 2017-07-13 MED ORDER — GADOBENATE DIMEGLUMINE 529 MG/ML IV SOLN
15.0000 mL | Freq: Once | INTRAVENOUS | Status: AC | PRN
  Administered 2017-07-13: 14 mL via INTRAVENOUS

## 2017-07-13 MED ORDER — GADOBENATE DIMEGLUMINE 529 MG/ML IV SOLN: 15.0000 mL | Freq: Once | INTRAVENOUS | Status: DC | PRN

## 2017-07-16 MED FILL — TRI-LO-SPRINTEC TABLET: 0.18/0.215/ | 84 days supply | Qty: 84 | Fill #0

## 2017-10-01 DIAGNOSIS — G35 Multiple sclerosis: Secondary | ICD-10-CM | POA: Diagnosis not present

## 2017-10-01 DIAGNOSIS — Z880 Allergy status to penicillin: Secondary | ICD-10-CM | POA: Diagnosis not present

## 2017-10-01 DIAGNOSIS — Z881 Allergy status to other antibiotic agents status: Secondary | ICD-10-CM | POA: Diagnosis not present

## 2017-10-22 ENCOUNTER — Other Ambulatory Visit: Payer: Self-pay

## 2017-10-22 ENCOUNTER — Encounter (HOSPITAL_COMMUNITY): Payer: Self-pay | Admitting: Nurse Practitioner

## 2017-10-22 ENCOUNTER — Emergency Department (HOSPITAL_BASED_OUTPATIENT_CLINIC_OR_DEPARTMENT_OTHER): Payer: 59

## 2017-10-22 ENCOUNTER — Emergency Department (HOSPITAL_COMMUNITY): Payer: 59

## 2017-10-22 ENCOUNTER — Inpatient Hospital Stay (HOSPITAL_BASED_OUTPATIENT_CLINIC_OR_DEPARTMENT_OTHER)
Admission: EM | Admit: 2017-10-22 | Discharge: 2017-10-24 | DRG: 059 | Disposition: A | Discharge status: Home / Self Care | Payer: 59 | Attending: Family Medicine | Admitting: Family Medicine

## 2017-10-22 DIAGNOSIS — R739 Hyperglycemia, unspecified: Secondary | ICD-10-CM | POA: Diagnosis not present

## 2017-10-22 DIAGNOSIS — I471 Supraventricular tachycardia, unspecified: Secondary | ICD-10-CM | POA: Diagnosis present

## 2017-10-22 DIAGNOSIS — R531 Weakness: Secondary | ICD-10-CM | POA: Diagnosis not present

## 2017-10-22 DIAGNOSIS — R000 Tachycardia, unspecified: Secondary | ICD-10-CM

## 2017-10-22 DIAGNOSIS — I951 Orthostatic hypotension: Secondary | ICD-10-CM

## 2017-10-22 DIAGNOSIS — Z8632 Personal history of gestational diabetes: Secondary | ICD-10-CM | POA: Diagnosis not present

## 2017-10-22 DIAGNOSIS — G35 Multiple sclerosis: Principal | ICD-10-CM | POA: Diagnosis present

## 2017-10-22 DIAGNOSIS — O24419 Gestational diabetes mellitus in pregnancy, unspecified control: Secondary | ICD-10-CM | POA: Diagnosis present

## 2017-10-22 DIAGNOSIS — R55 Syncope and collapse: Secondary | ICD-10-CM

## 2017-10-22 DIAGNOSIS — G90A Postural orthostatic tachycardia syndrome (POTS): Secondary | ICD-10-CM | POA: Diagnosis present

## 2017-10-22 DIAGNOSIS — R002 Palpitations: Secondary | ICD-10-CM | POA: Diagnosis not present

## 2017-10-22 DIAGNOSIS — Z79899 Other long term (current) drug therapy: Secondary | ICD-10-CM | POA: Diagnosis not present

## 2017-10-22 DIAGNOSIS — I498 Other specified cardiac arrhythmias: Secondary | ICD-10-CM | POA: Diagnosis present

## 2017-10-22 DIAGNOSIS — E876 Hypokalemia: Secondary | ICD-10-CM | POA: Diagnosis present

## 2017-10-22 DIAGNOSIS — R42 Dizziness and giddiness: Secondary | ICD-10-CM | POA: Diagnosis not present

## 2017-10-22 DIAGNOSIS — R0789 Other chest pain: Secondary | ICD-10-CM | POA: Diagnosis not present

## 2017-10-22 LAB — URINALYSIS, ROUTINE W REFLEX MICROSCOPIC
Bilirubin Urine: NEGATIVE
Glucose, UA: NEGATIVE mg/dL
Hgb urine dipstick: NEGATIVE
Ketones, ur: NEGATIVE mg/dL
Leukocytes, UA: NEGATIVE
Nitrite: NEGATIVE
Protein, ur: NEGATIVE mg/dL
Specific Gravity, Urine: 1.02 (ref 1.005–1.030)
pH: 5.5 (ref 5.0–8.0)

## 2017-10-22 LAB — CBC WITH DIFFERENTIAL/PLATELET
Basophils Absolute: 0 10*3/uL (ref 0.0–0.1)
Basophils Relative: 0 %
Eosinophils Absolute: 0.2 10*3/uL (ref 0.0–0.7)
Eosinophils Relative: 3 %
HCT: 37 % (ref 36.0–46.0)
Hemoglobin: 12.9 g/dL (ref 12.0–15.0)
Lymphocytes Relative: 41 %
Lymphs Abs: 2.8 10*3/uL (ref 0.7–4.0)
MCH: 32.4 pg (ref 26.0–34.0)
MCHC: 34.9 g/dL (ref 30.0–36.0)
MCV: 93 fL (ref 78.0–100.0)
Monocytes Absolute: 0.5 10*3/uL (ref 0.1–1.0)
Monocytes Relative: 7 %
Neutro Abs: 3.4 10*3/uL (ref 1.7–7.7)
Neutrophils Relative %: 49 %
Platelets: 174 10*3/uL (ref 150–400)
RBC: 3.98 MIL/uL (ref 3.87–5.11)
RDW: 12.2 % (ref 11.5–15.5)
WBC: 6.8 10*3/uL (ref 4.0–10.5)

## 2017-10-22 LAB — BASIC METABOLIC PANEL
Anion gap: 8 (ref 5–15)
BUN: 8 mg/dL (ref 6–20)
CO2: 24 mmol/L (ref 22–32)
Calcium: 8.7 mg/dL — ABNORMAL LOW (ref 8.9–10.3)
Chloride: 104 mmol/L (ref 101–111)
Creatinine, Ser: 0.68 mg/dL (ref 0.44–1.00)
GFR calc Af Amer: >60 mL/min (ref >60)
GFR calc non Af Amer: >60 mL/min (ref >60)
Glucose, Bld: 99 mg/dL (ref 65–99)
Potassium: 3.1 mmol/L — ABNORMAL LOW (ref 3.5–5.1)
Sodium: 136 mmol/L (ref 135–145)

## 2017-10-22 LAB — TROPONIN I: Troponin I: <0.03 ng/mL (ref <0.03)

## 2017-10-22 LAB — PREGNANCY, URINE: Preg Test, Ur: NEGATIVE

## 2017-10-22 MED ORDER — PANTOPRAZOLE SODIUM 40 MG IV SOLR
40.0000 mg | INTRAVENOUS | Status: DC
  Administered 2017-10-22: 40 mg via INTRAVENOUS
  Filled 2017-10-22: qty 40

## 2017-10-22 MED ORDER — DIPHENHYDRAMINE HCL 25 MG PO CAPS
50.0000 mg | ORAL_CAPSULE | Freq: Once | ORAL | Status: AC
  Administered 2017-10-23: 25 mg via ORAL
  Filled 2017-10-22: qty 2

## 2017-10-22 MED ORDER — SODIUM CHLORIDE 0.9 % IV SOLN
1000.0000 mg | Freq: Once | INTRAVENOUS | Status: AC
  Administered 2017-10-22: 1000 mg via INTRAVENOUS
  Filled 2017-10-22: qty 8

## 2017-10-22 MED ORDER — ONDANSETRON HCL 4 MG/2ML IJ SOLN: 4.0000 mg | Freq: Four times a day (QID) | INTRAMUSCULAR | Status: DC | PRN

## 2017-10-22 MED ORDER — NORGESTIM-ETH ESTRAD TRIPHASIC 0.18/0.215/0.25 MG-25 MCG PO TABS: 1.0000 | ORAL_TABLET | Freq: Every day | ORAL | Status: DC

## 2017-10-22 MED ORDER — VITAMIN D 1000 UNITS PO TABS
2000.0000 [IU] | ORAL_TABLET | Freq: Every day | ORAL | Status: DC
  Administered 2017-10-23 – 2017-10-24 (×2): 2000 [IU] via ORAL
  Filled 2017-10-22 (×2): qty 2

## 2017-10-22 MED ORDER — ACETAMINOPHEN 650 MG RE SUPP: 650.0000 mg | Freq: Four times a day (QID) | RECTAL | Status: DC | PRN

## 2017-10-22 MED ORDER — SODIUM CHLORIDE 0.9% FLUSH
3.0000 mL | Freq: Two times a day (BID) | INTRAVENOUS | Status: DC
  Administered 2017-10-22 – 2017-10-24 (×4): 3 mL via INTRAVENOUS

## 2017-10-22 MED ORDER — PANTOPRAZOLE SODIUM 40 MG PO TBEC: 40.0000 mg | DELAYED_RELEASE_TABLET | Freq: Every day | ORAL | Status: DC

## 2017-10-22 MED ORDER — ONDANSETRON HCL 4 MG PO TABS: 4.0000 mg | ORAL_TABLET | Freq: Four times a day (QID) | ORAL | Status: DC | PRN

## 2017-10-22 MED ORDER — SODIUM CHLORIDE 0.9 % IV SOLN: 1000.0000 mg | Freq: Every morning | INTRAVENOUS | Status: DC

## 2017-10-22 MED ORDER — GADOBENATE DIMEGLUMINE 529 MG/ML IV SOLN
15.0000 mL | Freq: Once | INTRAVENOUS | Status: AC | PRN
  Administered 2017-10-22: 15 mL via INTRAVENOUS

## 2017-10-22 MED ORDER — POTASSIUM CHLORIDE CRYS ER 20 MEQ PO TBCR
40.0000 meq | EXTENDED_RELEASE_TABLET | Freq: Once | ORAL | Status: AC
  Administered 2017-10-22: 40 meq via ORAL
  Filled 2017-10-22: qty 2

## 2017-10-22 MED ORDER — ACETAMINOPHEN 325 MG PO TABS
650.0000 mg | ORAL_TABLET | Freq: Four times a day (QID) | ORAL | Status: DC | PRN
  Administered 2017-10-22: 650 mg via ORAL
  Filled 2017-10-22: qty 2

## 2017-10-22 MED ORDER — IOPAMIDOL (ISOVUE-370) INJECTION 76%
100.0000 mL | Freq: Once | INTRAVENOUS | Status: AC | PRN
  Administered 2017-10-22: 100 mL via INTRAVENOUS

## 2017-10-22 MED ORDER — ENOXAPARIN SODIUM 40 MG/0.4ML ~~LOC~~ SOLN
40.0000 mg | SUBCUTANEOUS | Status: DC
  Administered 2017-10-23: 40 mg via SUBCUTANEOUS
  Filled 2017-10-22: qty 0.4

## 2017-10-22 MED ORDER — SODIUM CHLORIDE 0.9 % IV SOLN
1000.0000 mg | Freq: Every morning | INTRAVENOUS | Status: DC
  Administered 2017-10-23 – 2017-10-24 (×2): 1000 mg via INTRAVENOUS
  Filled 2017-10-22 (×2): qty 8

## 2017-10-22 NOTE — Consult Note (Addendum)
NEURO HOSPITALIST CONSULT NOTE   Requestig physician: Dr. Lorin Mercy   Reason for Consult: MS flare   History obtained from:  Patient     HPI:                                                                                                                                          Karen Powers is an 37 y.o. female who was diagnosed with MS back in 2009.  Patient sees [place] neurology for her MS.  Per her notes patient has relapsing remitting multiple sclerosis and was taken off of Copaxone secondary to becoming pregnant.  Since that time she has not been on any disease modifying medications.  She has been talking to her neurologist about going on Tecfidera however at this time she is possibly planning to have another child and is unsure if this is the correct medication to be on.  She has been doing paperwork in order to get the Tecfidera.  Until now she has had no cervical spine lesions.  Over the last few days she has noted that when she turned her head to the right, the posterior aspect and lateral aspect of her leg felt like there is a burning sensation.  In addition to that she felt somewhat dizzy but not vertiginous.  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  It is for this reason that she came to [facility] with the fear that she might have a MS flare.  MRI was obtained and did show a active MS lesion at the level of C1-C2 in the cervical spine.    Past Medical History:  Diagnosis Date  . Acute blood loss anemia 01/06/2016  . Anemia   . Diabetes mellitus without complication ([place]) 5176   Gestational diabetes only.  Marland Kitchen Dysrhythmia   . Gestational diabetes    2nd preg only  . Headache(784.0)    otc med prn  . Heartburn in pregnancy   . Maternal iron deficiency anemia  01/06/2016  . MS (multiple sclerosis) ([place])   . Neuromuscular disorder ([place])    MS  . Postpartum care following cesarean delivery (5/11) 01/05/2016  . POTS (postural orthostatic tachycardia syndrome)   . SVT (supraventricular tachycardia) ([place])    No problems since 10/2012 - no med needed  . Vaginal Pap smear, abnormal   . Vitamin deficiency     Past Surgical History:  Procedure Laterality Date  . ATRIAL ABLATION SURGERY  2003   FAILED  . CESAREAN SECTION  2006   Breech  . CESAREAN SECTION  2006   breech  . CESAREAN SECTION N/A 12/19/2013   Procedure: REPEAT CESAREAN SECTION;  Surgeon: Lovenia Kim, MD;  Location: [facility];  Service: Obstetrics;  Laterality: N/A;  EDD: 12/25/13  . CESAREAN SECTION N/A 01/05/2016   Procedure: Repeat CESAREAN SECTION;  Surgeon: Brien Few, MD;  Location: [facility];  Service: Obstetrics;  Laterality: N/A;  EDD: 01/12/16   . COLPOSCOPY    . WISDOM TOOTH EXTRACTION      Family History  Problem Relation Age of Onset  . Multiple sclerosis Mother   . Migraines Mother   . Emphysema Father   . Heart disease Father   . Migraines Father   . Migraines Sister   . Spina bifida Brother        half brother  . Cancer Paternal Grandfather        LUNG  . Diabetes Maternal Grandmother      Social History:  reports that  has never smoked. she has never used smokeless tobacco. She reports that she does not drink alcohol or use drugs.  Allergies  Allergen Reactions  . Ciprofloxacin Rash    Has patient had a PCN reaction causing immediate rash, facial/tongue/throat swelling, SOB or lightheadedness with hypotension: No Has patient had a PCN reaction causing severe rash involving mucus membranes or skin necrosis: No Has patient had a PCN reaction that required hospitalization No Has patient had a PCN reaction occurring within the last 10 years: Yes If all of the above answers are "NO", then may proceed with Cephalosporin use.    MEDICATIONS:                                                                                                                      Current Facility-Administered Medications  Medication Dose Route Frequency Provider Last Rate Last Dose  . acetaminophen (TYLENOL) suppository 650 mg  650 mg Rectal Q6H PRN Samella Parr, NP      . acetaminophen (TYLENOL) tablet 650 mg  650 mg Oral Q6H PRN Samella Parr, NP      . enoxaparin (LOVENOX) injection 40 mg  40 mg Subcutaneous Q24H Samella Parr, NP      . methylPREDNISolone sodium succinate (SOLU-MEDROL) 1,000 mg in sodium chloride 0.9 % 50 mL IVPB  1,000 mg Intravenous Once Carmin Muskrat, MD      . Derrill Memo ON 10/23/2017] methylPREDNISolone sodium succinate (SOLU-MEDROL) 1,000 mg in sodium chloride 0.9 % 50 mL IVPB  1,000 mg Intravenous q morning - 10a Samella Parr, NP      . Norgestimate-Ethinyl Estradiol Triphasic 0.18/0.215/0.25 MG-25 MCG tablet 1 tablet  1 tablet Oral Daily Samella Parr, NP      . ondansetron [facility]) tablet 4 mg  4 mg Oral Q6H PRN Samella Parr, NP      . potassium chloride SA (K-DUR,KLOR-CON) CR tablet 40 mEq  40 mEq Oral Once Erin Hearing L, NP      . sodium chloride flush (NS)  0.9 % injection 3 mL  3 mL Intravenous Q12H Samella Parr, NP      . Vitamin D CAPS 2,000 Units  2,000 Units Oral Daily Samella Parr, NP       Current Outpatient Medications  Medication Sig Dispense Refill  . acetaminophen (TYLENOL) 500 MG tablet Take 1,000 mg by mouth every 6 (six) hours as needed for headache.    . Cholecalciferol (VITAMIN D) 2000 units CAPS Take 2,000 Units by mouth daily.    Marland Kitchen ibuprofen (ADVIL,MOTRIN) 600 MG tablet Take 1 tablet (600 mg total) by mouth every 6 (six) hours as needed for mild pain. 30 tablet 0  . Multiple Vitamins-Minerals (WOMENS MULTIVITAMIN PLUS) TABS Take by mouth daily.    . Norgestimate-Ethinyl Estradiol Triphasic (TRI-LO-SPRINTEC) 0.18/0.215/0.25 MG-25 MCG tab Take 1 tablet by mouth daily.    Marland Kitchen  amoxicillin-clavulanate (AUGMENTIN) 875-125 MG tablet Take 1 tablet by mouth every 12 (twelve) hours. (Patient not taking: Reported on 10/22/2017) 14 tablet 0  . fluconazole (DIFLUCAN) 150 MG tablet Take 1 tablet (150 mg total) by mouth daily. Repeat in 72 hours if symptoms do not resolve. (Patient not taking: Reported on 10/22/2017) 2 tablet 0  . ondansetron (ZOFRAN) 4 MG tablet Take 1 tablet (4 mg total) by mouth every 6 (six) hours. (Patient not taking: Reported on 10/22/2017) 12 tablet 0      ROS:                                                                                                                                       History obtained from the patient  General ROS: negative for - chills, fatigue, fever, night sweats, weight gain or weight loss Psychological ROS: negative for - behavioral disorder, hallucinations, memory difficulties, mood swings or suicidal ideation Ophthalmic ROS: negative for - blurry vision, double vision, eye pain or loss of vision ENT ROS: negative for - epistaxis, nasal discharge, oral lesions, sore throat, tinnitus or vertigo Allergy and Immunology ROS: negative for - hives or itchy/watery eyes Hematological and Lymphatic ROS: negative for - bleeding problems, bruising or swollen lymph nodes Endocrine ROS: negative for - galactorrhea, hair pattern changes, polydipsia/polyuria or temperature intolerance Respiratory ROS: negative for - cough, hemoptysis, shortness of breath or wheezing Cardiovascular ROS: negative for - chest pain, dyspnea on exertion, edema or irregular heartbeat Gastrointestinal ROS: negative for - abdominal pain, diarrhea, hematemesis, nausea/vomiting or stool incontinence Genito-Urinary ROS: negative for - dysuria, hematuria, incontinence or urinary frequency/urgency Musculoskeletal ROS: negative for - joint swelling or muscular weakness Neurological ROS: as noted in HPI Dermatological ROS: negative for rash and skin lesion  changes   Blood pressure 112/79, pulse 80, temperature 98.6 F (37 C), temperature source Oral, resp. rate 18, height 5\' 9"  (1.753 m), weight 65.8 kg (145 lb), last menstrual period 10/05/2017, SpO2 97 %, unknown if currently breastfeeding.   General Examination:  Physical Exam  HEENT-  Normocephalic, no lesions, without obvious abnormality.  Normal external eye and conjunctiva.   Cardiovascular- S1-S2 audible, pulses palpable throughout   Lungs-no rhonchi or wheezing noted, no excessive working breathing.  Saturations within normal limits Abdomen- All 4 quadrants palpated and nontender Extremities- Warm, dry and intact Musculoskeletal-no joint tenderness, deformity or swelling Skin-warm and dry, no hyperpigmentation, vitiligo, or suspicious lesions  Neurological Examination Mental Status: Alert, oriented, thought content appropriate.  Speech fluent without evidence of aphasia.  Able to follow 3 step commands without difficulty. Cranial Nerves: II: Visual fields grossly normal,  III,IV, VI: ptosis not present, extra-ocular motions intact bilaterally pupils equal, round, reactive to light and accommodation V,VII: smile symmetric, facial light touch sensation normal bilaterally VIII: hearing normal bilaterally IX,X: uvula rises symmetrically XI: bilateral shoulder shrug XII: midline tongue extension Motor: Right : Upper extremity   5/5    Left:     Upper extremity   5/5  Lower extremity   5/5     Lower extremity   5/5 Tone and bulk:normal tone throughout; no atrophy noted Sensory: Pinprick and light touch intact throughout, bilaterally--subjective burning sensation in the right lateral and posterior leg Deep Tendon Reflexes: 2+ and symmetric throughout Plantars: Right: downgoing   Left: downgoing Cerebellar: normal finger-to-nose, normal rapid alternating movements and normal  heel-to-shin test Gait: Not tested   Lab Results: Basic Metabolic Panel: Recent Labs  Lab 10/22/17 0544  NA 136  K 3.1*  CL 104  CO2 24  GLUCOSE 99  BUN 8  CREATININE 0.68  CALCIUM 8.7*    CBC: Recent Labs  Lab 10/22/17 0544  WBC 6.8  NEUTROABS 3.4  HGB 12.9  HCT 37.0  MCV 93.0  PLT 174    Cardiac Enzymes: Recent Labs  Lab 10/22/17 0544  TROPONINI <0.03    Lipid Panel: No results for input(s): CHOL, TRIG, HDL, CHOLHDL, VLDL, LDLCALC in the last 168 hours.  Imaging: Ct Angio Head W Or Wo Contrast  Result Date: 10/22/2017 CLINICAL DATA:  37 year old female with sudden onset dizziness, tachycardia, syncope. Multiple sclerosis. EXAM: CT ANGIOGRAPHY HEAD AND NECK TECHNIQUE: Multidetector CT imaging of the head and neck was performed using the standard protocol during bolus administration of intravenous contrast. Multiplanar CT image reconstructions and MIPs were obtained to evaluate the vascular anatomy. Carotid stenosis measurements (when applicable) are obtained utilizing NASCET criteria, using the distal internal carotid diameter as the denominator. CONTRAST:  112mL ISOVUE-370 IOPAMIDOL (ISOVUE-370) INJECTION 76% COMPARISON:  Brain MRI 07/13/2017, cervical spine 07/13/2017, and earlier. FINDINGS: CT HEAD Brain: Cerebral volume is stable and within normal limits. No midline shift, ventriculomegaly, mass effect, evidence of mass lesion, intracranial hemorrhage or evidence of cortically based acute infarction. The periventricular signal abnormality on MRI is occult on CT. Gray-white matter differentiation appears within normal limits throughout. No cortical encephalomalacia. Calvarium and skull base: Negative. Paranasal sinuses: Clear. Orbits: Visualized orbits and scalp soft tissues are within normal limits. CTA NECK Skeleton: Negative. Upper chest: Negative visible lungs. Normal visible superior mediastinum. Other neck: Negative.  No cervical lymphadenopathy. Aortic arch: 3  vessel arch configuration. No arch atherosclerosis. Normal great vessel origins. Right carotid system: Normal. Left carotid system: Normal. Vertebral arteries: Normal proximal right subclavian artery and right vertebral artery origin. Normal right vertebral artery to the skull base. Normal proximal left subclavian artery and left vertebral artery origin. The left vertebral artery is mildly dominant, and normal to the skull base. CTA HEAD Posterior circulation: Mildly dominant distal left  vertebral artery. No distal vertebral artery abnormality. Normal right PICA origin. The left SCA appears to be dominant. Patent vertebrobasilar junction. Patent basilar artery without stenosis. Normal SCA and PCA origins. Posterior communicating arteries are diminutive or absent. Bilateral PCA branches appear normal. Anterior circulation: Patent ICA siphons with no atherosclerosis or stenosis. Patent carotid termini. Normal MCA and ACA origins. Mildly tortuous A1 segments. Anterior communicating artery and bilateral ACA branches are within normal limits. Left MCA M1 segment, trifurcation, and left MCA branches are within normal limits. Right MCA M1 segment, bifurcation, and right MCA branches are within normal limits. Venous sinuses: Patent. Anatomic variants: Mildly dominant left vertebral artery. Delayed phase: No abnormal enhancement identified. Review of the MIP images confirms the above findings IMPRESSION: 1. Negative CTA Head and Neck, with no arterial abnormality identified. 2. Normal CT appearance of the brain, the multiple sclerosis associated white matter disease is occult by CT. Electronically Signed   By: Genevie Ann M.D.   On: 10/22/2017 07:01   Ct Angio Neck W And/or Wo Contrast  Result Date: 10/22/2017 CLINICAL DATA:  37 year old female with sudden onset dizziness, tachycardia, syncope. Multiple sclerosis. EXAM: CT ANGIOGRAPHY HEAD AND NECK TECHNIQUE: Multidetector CT imaging of the head and neck was performed using  the standard protocol during bolus administration of intravenous contrast. Multiplanar CT image reconstructions and MIPs were obtained to evaluate the vascular anatomy. Carotid stenosis measurements (when applicable) are obtained utilizing NASCET criteria, using the distal internal carotid diameter as the denominator. CONTRAST:  137mL ISOVUE-370 IOPAMIDOL (ISOVUE-370) INJECTION 76% COMPARISON:  Brain MRI 07/13/2017, cervical spine 07/13/2017, and earlier. FINDINGS: CT HEAD Brain: Cerebral volume is stable and within normal limits. No midline shift, ventriculomegaly, mass effect, evidence of mass lesion, intracranial hemorrhage or evidence of cortically based acute infarction. The periventricular signal abnormality on MRI is occult on CT. Gray-white matter differentiation appears within normal limits throughout. No cortical encephalomalacia. Calvarium and skull base: Negative. Paranasal sinuses: Clear. Orbits: Visualized orbits and scalp soft tissues are within normal limits. CTA NECK Skeleton: Negative. Upper chest: Negative visible lungs. Normal visible superior mediastinum. Other neck: Negative.  No cervical lymphadenopathy. Aortic arch: 3 vessel arch configuration. No arch atherosclerosis. Normal great vessel origins. Right carotid system: Normal. Left carotid system: Normal. Vertebral arteries: Normal proximal right subclavian artery and right vertebral artery origin. Normal right vertebral artery to the skull base. Normal proximal left subclavian artery and left vertebral artery origin. The left vertebral artery is mildly dominant, and normal to the skull base. CTA HEAD Posterior circulation: Mildly dominant distal left vertebral artery. No distal vertebral artery abnormality. Normal right PICA origin. The left SCA appears to be dominant. Patent vertebrobasilar junction. Patent basilar artery without stenosis. Normal SCA and PCA origins. Posterior communicating arteries are diminutive or absent. Bilateral PCA  branches appear normal. Anterior circulation: Patent ICA siphons with no atherosclerosis or stenosis. Patent carotid termini. Normal MCA and ACA origins. Mildly tortuous A1 segments. Anterior communicating artery and bilateral ACA branches are within normal limits. Left MCA M1 segment, trifurcation, and left MCA branches are within normal limits. Right MCA M1 segment, bifurcation, and right MCA branches are within normal limits. Venous sinuses: Patent. Anatomic variants: Mildly dominant left vertebral artery. Delayed phase: No abnormal enhancement identified. Review of the MIP images confirms the above findings IMPRESSION: 1. Negative CTA Head and Neck, with no arterial abnormality identified. 2. Normal CT appearance of the brain, the multiple sclerosis associated white matter disease is occult by CT. Electronically Signed  By: Genevie Ann M.D.   On: 10/22/2017 07:01   Mr Brain W And Wo Contrast  Result Date: 10/22/2017 CLINICAL DATA:  Multiple sclerosis. Dizziness and near-syncope when turning head. New neurologic event. EXAM: MRI HEAD WITHOUT AND WITH CONTRAST MRI CERVICAL SPINE WITHOUT AND WITH CONTRAST TECHNIQUE: Multiplanar, multiecho pulse sequences of the brain and surrounding structures, and cervical spine, to include the craniocervical junction and cervicothoracic junction, were obtained without and with intravenous contrast. CONTRAST:  49mL MULTIHANCE GADOBENATE DIMEGLUMINE 529 MG/ML IV SOLN COMPARISON:  MRI of the brain and cervical spine 07/13/2017. FINDINGS: MRI HEAD FINDINGS Brain: Periventricular T2 signal changes are stable from the prior study. There is some involvement of the corpus callosum. No new lesions are present. There is no restricted diffusion or enhancement. No acute infarct, hemorrhage, or mass lesion is present. The ventricles are of normal size. No significant extra-axial fluid collection is present. Postcontrast images demonstrate no pathologic enhancement. Vascular: Flow is present  in the major intracranial arteries. Skull and upper cervical spine: Skull base is normal. Craniocervical junction is normal. Marrow signal is unremarkable. Sinuses/Orbits: The paranasal sinuses and mastoid air cells are clear. Globes and orbits are within normal limits. MRI CERVICAL SPINE FINDINGS Alignment: AP alignment is anatomic. Vertebrae: Marrow signal and vertebral body heights are normal. Cord: New T2 signal changes are present in the left hemi cord at the C2 level. This extends 2 cm cephalo caudad is noted laterally and anteriorly. There is no associated enhancement. A smaller 11 mm cephalo caudad area of T2 signal changes present in the far right lateral hemi cord at the C2-3 level. No other focal cord signal changes are present to the lowest imaged level T2-3. Posterior Fossa, vertebral arteries, paraspinal tissues: The craniocervical junction is normal. Flow is present in the vertebral arteries bilaterally. Disc levels: A mild leftward disc osteophyte complex is again noted at C5-6. No significant stenosis is present. No other significant focal disc disease is present. IMPRESSION: 1. New T2 signal changes in the left hemicord at C2 extending 2 cm cephalo caudad. 2. Smaller new area of T2 signal change in the far right lateral hemi cord at C2-3 measuring 11 mm cephalo caudad. These lesions likely represent new areas of demyelination within the spinal cord. 3. Intracranial periventricular T2 signal changes are stable. 4. No acute intracranial abnormality. Electronically Signed   By: San Morelle M.D.   On: 10/22/2017 12:16   Mr Cervical Spine W Or Wo Contrast  Result Date: 10/22/2017 CLINICAL DATA:  Multiple sclerosis. Dizziness and near-syncope when turning head. New neurologic event. EXAM: MRI HEAD WITHOUT AND WITH CONTRAST MRI CERVICAL SPINE WITHOUT AND WITH CONTRAST TECHNIQUE: Multiplanar, multiecho pulse sequences of the brain and surrounding structures, and cervical spine, to include the  craniocervical junction and cervicothoracic junction, were obtained without and with intravenous contrast. CONTRAST:  64mL MULTIHANCE GADOBENATE DIMEGLUMINE 529 MG/ML IV SOLN COMPARISON:  MRI of the brain and cervical spine 07/13/2017. FINDINGS: MRI HEAD FINDINGS Brain: Periventricular T2 signal changes are stable from the prior study. There is some involvement of the corpus callosum. No new lesions are present. There is no restricted diffusion or enhancement. No acute infarct, hemorrhage, or mass lesion is present. The ventricles are of normal size. No significant extra-axial fluid collection is present. Postcontrast images demonstrate no pathologic enhancement. Vascular: Flow is present in the major intracranial arteries. Skull and upper cervical spine: Skull base is normal. Craniocervical junction is normal. Marrow signal is unremarkable. Sinuses/Orbits: The paranasal sinuses  and mastoid air cells are clear. Globes and orbits are within normal limits. MRI CERVICAL SPINE FINDINGS Alignment: AP alignment is anatomic. Vertebrae: Marrow signal and vertebral body heights are normal. Cord: New T2 signal changes are present in the left hemi cord at the C2 level. This extends 2 cm cephalo caudad is noted laterally and anteriorly. There is no associated enhancement. A smaller 11 mm cephalo caudad area of T2 signal changes present in the far right lateral hemi cord at the C2-3 level. No other focal cord signal changes are present to the lowest imaged level T2-3. Posterior Fossa, vertebral arteries, paraspinal tissues: The craniocervical junction is normal. Flow is present in the vertebral arteries bilaterally. Disc levels: A mild leftward disc osteophyte complex is again noted at C5-6. No significant stenosis is present. No other significant focal disc disease is present. IMPRESSION: 1. New T2 signal changes in the left hemicord at C2 extending 2 cm cephalo caudad. 2. Smaller new area of T2 signal change in the far right  lateral hemi cord at C2-3 measuring 11 mm cephalo caudad. These lesions likely represent new areas of demyelination within the spinal cord. 3. Intracranial periventricular T2 signal changes are stable. 4. No acute intracranial abnormality. Electronically Signed   By: San Morelle M.D.   On: 10/22/2017 12:16    Assessment and plan per attending neurologist  Etta Quill PA-C [facility] [phone]  10/22/2017, 3:10 PM  NEUROHOSPITALIST ADDENDUM Seen and examined the patient today.  Agree with above HPI and exam findings. No significant weakness noted on exam.   ASSESSMENT AND PLAN  37 y.o. female who was diagnosed with MS back in 2009 not on Disease modifying therapy presents with burning sensation in right leg on turning her head. MRI C spine shows new enhancing lesion in her C spine.    Relapsing Remitting MS - now presenting with MS exacerbation  Will administer 3 days of IV methylprednisone F/U with neurologist as outpatient to start DMD  Aleksa Catterton MD [facility] [phone]  If 7pm to 7am, please call on call as listed on AMION.

## 2017-10-22 NOTE — ED Notes (Addendum)
Pt in as transfer from [place]. CC of near syncope and bilateral leg weakness. Transferred for MRI of Brain and Spine. MAE's equally - c/o lethargy and numbness in L posterior thigh when turning neck  122/89 95 98% RA

## 2017-10-22 NOTE — ED Provider Notes (Signed)
[facility] Provider Note   CSN: [id number] Arrival date & time: 10/22/17  1660     History   Chief Complaint Chief Complaint  Patient presents with  . Near Syncope    HPI Karen Powers is a 37 y.o. female.  HPI  This is a 37 year old female with a history of multiple sclerosis, POTS, SVT who presents with near syncope and weakness.  Patient is a Marine scientist in our department.  While working she had an episode of lightheadedness and near syncope.  She did not pass out.  Denies room spinning dizziness.  Heart rate at time of the event was 137 patient's watch.  She denies headache at that time.  She does report palpitations and chest pressure.  Patient also states that she has noted over the last 2-3 weeks right-sided leg weakness specifically with turning her head down to the left.  She previously had some sensory abnormalities in her right head with turning her head to the right.  She has a history of MS.  She states she discussed this with her neurologist who did not think this was MS related.  Last MRI was in November without any cervical lesions.  She denies any recent illnesses or fevers.  She denies any lower extremity swelling or history of blood clots.  She is not currently on birth control.  She is not taking any medications for her MS.  Past Medical History:  Diagnosis Date  . Acute blood loss anemia 01/06/2016  . Anemia   . Diabetes mellitus without complication ([place]) 6301   Gestational diabetes only.  Marland Kitchen Dysrhythmia   . Gestational diabetes    2nd preg only  . Headache(784.0)    otc med prn  . Heartburn in pregnancy   . Maternal iron deficiency anemia 01/06/2016  . MS (multiple sclerosis) ([place])   . Neuromuscular disorder ([place])    MS  . Postpartum care following cesarean delivery (5/11) 01/05/2016  . POTS (postural orthostatic tachycardia syndrome)   . SVT (supraventricular tachycardia) ([place])    No problems since 10/2012 - no med needed  .  Vaginal Pap smear, abnormal   . Vitamin deficiency     Patient Active Problem List   Diagnosis Date Noted  . Maternal iron deficiency anemia 01/06/2016  . Acute blood loss anemia 01/06/2016  . Postpartum care following cesarean delivery (5/11) 01/05/2016  . Wellness examination 09/28/2014  . Acute pharyngitis 09/08/2014  . Previous cesarean section 12/19/2013  . Multiple sclerosis ([place]) 09/07/2011  . Migraine 09/07/2011  . Paroxysmal SVT (supraventricular tachycardia) ([place]) 09/07/2011  . UTI (urinary tract infection) 09/07/2011    Past Surgical History:  Procedure Laterality Date  . ATRIAL ABLATION SURGERY  2003   FAILED  . CESAREAN SECTION  2006   [place]  . CESAREAN SECTION  2006   breech  . CESAREAN SECTION N/A 12/19/2013   Procedure: REPEAT CESAREAN SECTION;  Surgeon: Lovenia Kim, MD;  Location: [facility];  Service: Obstetrics;  Laterality: N/A;  EDD: 12/25/13  . CESAREAN SECTION N/A 01/05/2016   Procedure: Repeat CESAREAN SECTION;  Surgeon: Brien Few, MD;  Location: [facility];  Service: Obstetrics;  Laterality: N/A;  EDD: 01/12/16   . COLPOSCOPY    . WISDOM TOOTH EXTRACTION      OB History    Gravida Para Term Preterm AB Living   4 3 3   1 3    SAB TAB Ectopic Multiple Live Births   1  0 3       Home Medications    Prior to Admission medications   Medication Sig Start Date End Date Taking? Authorizing Provider  acetaminophen (TYLENOL) 500 MG tablet Take 1,000 mg by mouth every 6 (six) hours as needed for headache.   Yes [provider]  amoxicillin-clavulanate (AUGMENTIN) 875-125 MG tablet Take 1 tablet by mouth every 12 (twelve) hours. 04/01/17  Yes Law, Alexandra M, PA-C  fluconazole (DIFLUCAN) 150 MG tablet Take 1 tablet (150 mg total) by mouth daily. Repeat in 72 hours if symptoms do not resolve. 04/01/17  Yes Law, Bea Graff, PA-C  ibuprofen (ADVIL,MOTRIN) 600 MG tablet Take 1 tablet (600 mg total) by mouth every 6 (six) hours as needed  for mild pain. 01/08/16  Yes Laury Deep, CNM  loratadine (CLARITIN) 10 MG tablet Take 10 mg by mouth daily as needed for allergies.   Yes [provider]  Multiple Vitamins-Minerals (WOMENS MULTIVITAMIN PLUS) TABS Take by mouth daily.   Yes [provider]  norethindrone (HEATHER) 0.35 MG tablet Take 1 tablet by mouth daily.   Yes [provider]  ondansetron (ZOFRAN) 4 MG tablet Take 1 tablet (4 mg total) by mouth every 6 (six) hours. 04/01/17   Frederica Kuster, PA-C    Family History Family History  Problem Relation Age of Onset  . Multiple sclerosis Mother   . Migraines Mother   . Emphysema Father   . Heart disease Father   . Migraines Father   . Migraines Sister   . Spina bifida Brother        half brother  . Cancer Paternal Grandfather        LUNG  . Diabetes Maternal Grandmother     Social History Social History   Tobacco Use  . Smoking status: Never Smoker  . Smokeless tobacco: Never Used  Substance Use Topics  . Alcohol use: No  . Drug use: No     Allergies   Paxil [paroxetine hcl] and Ciprofloxacin   Review of Systems Review of Systems  Constitutional: Negative for fever.  Respiratory: Positive for chest tightness. Negative for cough and shortness of breath.   Cardiovascular: Positive for palpitations. Negative for chest pain and leg swelling.  Gastrointestinal: Negative for abdominal pain, nausea and vomiting.  Genitourinary: Negative for dysuria.  Musculoskeletal: Negative for back pain, neck pain and neck stiffness.  Neurological: Positive for weakness. Negative for headaches.       Sensory abnormality  Psychiatric/Behavioral: Negative for confusion.  All other systems reviewed and are negative.    Physical Exam Updated Vital Signs Ht 5\' 9"  (1.753 m)   Wt 65.8 kg (145 lb)   LMP 10/05/2017   BMI 21.41 kg/m   Physical Exam  Constitutional: She is oriented to person, place, and time. She appears well-developed and  well-nourished. No distress.  HENT:  Head: Normocephalic and atraumatic.  Eyes: EOM are normal. Pupils are equal, round, and reactive to light.  Neck: Normal range of motion. Neck supple.  Cardiovascular: Normal rate, regular rhythm and normal heart sounds.  No murmur heard. Pulmonary/Chest: Effort normal and breath sounds normal. No respiratory distress. She has no wheezes.  Abdominal: Soft. Bowel sounds are normal. There is no tenderness. There is no guarding.  Neurological: She is alert and oriented to person, place, and time.  5 out of 5 strength bilateral upper extremities, 4+ out of 5 strength bilateral lower extremities difficult to assess true weakness as patient reported leg cramping during  testing bilaterally, 5 out of 5 plantar dorsiflexion bilaterally, no clonus, brisk patellar reflexes equal bilaterally, no drift, no dysmetria to finger-nose-finger  Skin: Skin is warm and dry.  Psychiatric: She has a normal mood and affect.  Nursing note and vitals reviewed.    ED Treatments / Results  Labs (all labs ordered are listed, but only abnormal results are displayed) Labs Reviewed  BASIC METABOLIC PANEL - Abnormal; Notable for the following components:      Result Value   Potassium 3.1 (*)    Calcium 8.7 (*)    All other components within normal limits  CBC WITH DIFFERENTIAL/PLATELET  URINALYSIS, ROUTINE W REFLEX MICROSCOPIC  PREGNANCY, URINE  TROPONIN I    EKG  EKG Interpretation  Date/Time:  Tuesday October 22 2017 06:40:15 EST Ventricular Rate:  104 PR Interval:    QRS Duration: 64 QT Interval:  332 QTC Calculation: 437 R Axis:   72 Text Interpretation:  Sinus tachycardia Probable anteroseptal infarct, old Minimal ST depression, inferior leads Tachycardia when compared to prior Confirmed by Thayer Jew [phone]) on 10/22/2017 6:43:24 AM       Radiology No results found.  Procedures Procedures (including critical care time)  Medications Ordered in  ED Medications  iopamidol (ISOVUE-370) 76 % injection 100 mL (100 mLs Intravenous Contrast Given 10/22/17 0616)     Initial Impression / Assessment and Plan / ED Course  I have reviewed the triage vital signs and the nursing notes.  Pertinent labs & imaging results that were available during my care of the patient were reviewed by me and considered in my medical decision making (see chart for details).     Patient presents with near syncope.  Also reports recent history of sensory neurologic symptoms and weakness with head turning.  In addition to MS, she does have a cardiac history including SVT and questionable pots diagnosis.  She is overall nontoxic appearing.  Was initially tachycardic but self resolved with resting.  Workup initiated.  Her neuro exam is fairly reassuring.  She does have mild proximal muscle weakness of the lower extremities bilaterally which seems out of proportion.  Reflexes are intact.  I am concerned given the neurologic symptoms with turning of her head.  She denies any recent trauma or chiropractic manipulation.  Considerations include dissection, atypical presentation of MS.  She denies any ocular symptoms.  Her initial workup is largely reassuring.  I discussed with patient that given her neuro symptoms in addition to the near syncope, I was less suspicious for things such as PE even though her heart rate was elevated.  Patient is comfortable with not pursuing that workup until neurologic workup is complete.  CT angios the head and neck is pending.  If this is negative, would recommend MRI head and cervical spine to rule out progression of MS or active lesions.  Patient is agreeable with plan.  Final Clinical Impressions(s) / ED Diagnoses   Final diagnoses:  Near syncope  Weakness    ED Discharge Orders    None       Merryl Hacker, MD 10/22/17 [phone]

## 2017-10-22 NOTE — ED Provider Notes (Signed)
  Physical Exam  Ht 5\' 9"  (1.753 m)   Wt 65.8 kg (145 lb)   LMP 10/05/2017   BMI 21.41 kg/m   Physical Exam  ED Course/Procedures     Procedures  MDM  Patient has hx of Multiple sclerosis not on meds currently. Has some dizziness and near syncope event when turning her head. CTA head/neck pending and showed no dissection. [facility] for [facility] brain and cervical spine to r/o MS exacerbation.   7:11 AM Discussed with Dr. Vanita Panda at [facility], who will accept the transfer     Drenda Freeze, MD 10/22/17 [phone]

## 2017-10-22 NOTE — ED Triage Notes (Signed)
Pt standing talking to co-workers, felt sudden onset of "passing out" lightheadedness. Pt sat down immediately. No LOC. HR was 138.

## 2017-10-22 NOTE — ED Provider Notes (Signed)
Patient arrives via transfer from our affiliated facility. She is awake, alert, with a patent airway. Patient continues to have some mild twitching in her hands, otherwise no notable findings. Patient is awaiting MRI.  2:38 PM I discussed the patient's MRI results with her neurologist. Patient will receive Solu-Medrol, 1 g, required admission to the hospitalist team. MRI results below  IMPRESSION: 1. New T2 signal changes in the left hemicord at C2 extending 2 cm cephalo caudad. 2. Smaller new area of T2 signal change in the far right lateral hemi cord at C2-3 measuring 11 mm cephalo caudad. These lesions likely represent new areas of demyelination within the spinal cord. 3. Intracranial periventricular T2 signal changes are stable. 4. No acute intracranial abnormality.     Electronically Signed   By: San Morelle M.D.   On: 10/22/2017 12:16      Carmin Muskrat, MD 10/22/17 1438

## 2017-10-22 NOTE — H&P (Addendum)
History and Physical    Karen Powers GDJ:[id number] DOB: [birth year] DOA: 10/22/2017  **Will admit patient based on the expectation that the patient will need hospitalization/ hospital care that crosses at least 2 midnights  PCP: Elise Benne   Attending physician: Lorin Mercy  Patient coming from/Resides with: Private residence/spouse  Chief Complaint: MS flare  HPI: Karen Powers is a 37 y.o. female with medical history significant for known multiple sclerosis, gestational diabetes, anemia secondary to pregnancy.  Patient is a Marine scientist at [employer].  Today patient was standing and talking with coworkers and had near syncopal sensation/lightheadedness.  She sat down immediately.  She had no awareness of palpitations.  Heart rate was initially 138 and briefly picked up to 170 before returning to normal.  She was subsequently taken to a treatment area and additional history obtained by the EDP was concerning for possible MS flare.  Patient reports that for 1 month she has been having sensation of burning and tingling in the right thigh region/near the right posterior knee when turning her head toward the left.  More concerning though was a sensation of right leg weak and experiencing difficulty standing when turning her head to the left and looking downward.  On exam patient did demonstrate weakness with independent leg lift with turning head to left and attempting to lift right leg.  Neurological exam was otherwise unremarkable.  Because MRI is unavailable administer [place] patient was subsequently transferred to the [facility] ER.  MRI imaging did reveal new T2 signal changes in the left hemicord at C2 extending 2 cm cephalocaudad as well as a smaller new area of T2 signal change in the far right lateral hemicord at C2-3 measuring 11 mm cephalocaudad.  Radiologist suspect that these lesions were representative of new areas of demyelination within the spinal cord.  Neurology  has been consulted and an initial dose of Solu-Medrol 1 g IV has been ordered.  ED Course:  Vital Signs: BP 112/79   Pulse 80   Temp 98.6 F (37 C) (Oral)   Resp 18   Ht 5\' 9"  (1.753 m)   Wt 65.8 kg (145 lb)   LMP 10/05/2017   SpO2 97%   BMI 21.41 kg/m  MRI brain with and without contrast: As above CTA head and neck: Neg Lab data: Sodium 136, potassium 3.1, chloride 104, CO2 24, glucose 99, BUN 8, creatinine 0.68, calcium 8.7, anion gap 8, troponin <0.03, white count 6800 with normal differential, hemoglobin 12.9, platelets 174,000, urine pregnancy negative, urinalysis unremarkable and no evidence of UTI Medications and treatment: Solu-Medrol 1 g IV ordered but not yet administered  Review of Systems:  In addition to the HPI above,  No Fever-chills, myalgias or other constitutional symptoms No Headache, changes with Vision or hearing, dysarthria or word finding difficulty, gait disturbance or imbalance, seizure activity; reported with bilateral hand fasciculations at previous facility noting these are no longer present No problems swallowing food or Liquids, indigestion/reflux, choking or coughing while eating, abdominal pain with or after eating No Chest pain, Cough or Shortness of Breath, palpitations, orthopnea or DOE No Abdominal pain, N/V, melena,hematochezia, dark tarry stools, constipation No dysuria, malodorous urine, hematuria or flank pain No new skin rashes, lesions, masses or bruises, No new joint pains, aches, swelling or redness No recent unintentional weight gain or loss No polyuria, polydypsia or polyphagia   Past Medical History:  Diagnosis Date  . Acute blood loss anemia 01/06/2016  . Anemia   .  Diabetes mellitus without complication ([place]) 1914   Gestational diabetes only.  Marland Kitchen Dysrhythmia   . Gestational diabetes    2nd preg only  . Headache(784.0)    otc med prn  . Heartburn in pregnancy   . Maternal iron deficiency anemia 01/06/2016  . MS (multiple  sclerosis) ([place])   . Neuromuscular disorder ([place])    MS  . Postpartum care following cesarean delivery (5/11) 01/05/2016  . POTS (postural orthostatic tachycardia syndrome)   . SVT (supraventricular tachycardia) ([place])    No problems since 10/2012 - no med needed  . Vaginal Pap smear, abnormal   . Vitamin deficiency     Past Surgical History:  Procedure Laterality Date  . ATRIAL ABLATION SURGERY  2003   FAILED  . CESAREAN SECTION  2006   [place]  . CESAREAN SECTION  2006   breech  . CESAREAN SECTION N/A 12/19/2013   Procedure: REPEAT CESAREAN SECTION;  Surgeon: Lovenia Kim, MD;  Location: [facility];  Service: Obstetrics;  Laterality: N/A;  EDD: 12/25/13  . CESAREAN SECTION N/A 01/05/2016   Procedure: Repeat CESAREAN SECTION;  Surgeon: Brien Few, MD;  Location: [place];  Service: Obstetrics;  Laterality: N/A;  EDD: 01/12/16   . COLPOSCOPY    . WISDOM TOOTH EXTRACTION      Social History   Socioeconomic History  . Marital status: Married    Spouse name: Not on file  . Number of children: Not on file  . Years of education: Not on file  . Highest education level: Not on file  Social Needs  . Financial resource strain: Not on file  . Food insecurity - worry: Not on file  . Food insecurity - inability: Not on file  . Transportation needs - medical: Not on file  . Transportation needs - non-medical: Not on file  Occupational History  . Not on file  Tobacco Use  . Smoking status: Never Smoker  . Smokeless tobacco: Never Used  Substance and Sexual Activity  . Alcohol use: No  . Drug use: No  . Sexual activity: Yes    Birth control/protection: Pill    Comment: pregnant  Other Topics Concern  . Not on file  Social History Narrative   ** Merged History Encounter **        Mobility: Independent Work history: Equities trader on night shift at [employer] Reactions  . Ciprofloxacin Rash    Has patient had a PCN reaction  causing immediate rash, facial/tongue/throat swelling, SOB or lightheadedness with hypotension: No Has patient had a PCN reaction causing severe rash involving mucus membranes or skin necrosis: No Has patient had a PCN reaction that required hospitalization No Has patient had a PCN reaction occurring within the last 10 years: Yes If all of the above answers are "NO", then may proceed with Cephalosporin use.    Family History  Problem Relation Age of Onset  . Multiple sclerosis Mother   . Migraines Mother   . Emphysema Father   . Heart disease Father   . Migraines Father   . Migraines Sister   . Spina bifida Brother        half brother  . Cancer Paternal Grandfather        LUNG  . Diabetes Maternal Grandmother      Prior to Admission medications   Medication Sig Start Date End Date Taking? Authorizing Provider  acetaminophen (TYLENOL) 500 MG tablet Take 1,000 mg by  mouth every 6 (six) hours as needed for headache.   Yes [provider]  Cholecalciferol (VITAMIN D) 2000 units CAPS Take 2,000 Units by mouth daily.   Yes [provider]  ibuprofen (ADVIL,MOTRIN) 600 MG tablet Take 1 tablet (600 mg total) by mouth every 6 (six) hours as needed for mild pain. 01/08/16  Yes Laury Deep, CNM  Multiple Vitamins-Minerals (WOMENS MULTIVITAMIN PLUS) TABS Take by mouth daily.   Yes [provider]  Norgestimate-Ethinyl Estradiol Triphasic (TRI-LO-SPRINTEC) 0.18/0.215/0.25 MG-25 MCG tab Take 1 tablet by mouth daily.   Yes [provider]  amoxicillin-clavulanate (AUGMENTIN) 875-125 MG tablet Take 1 tablet by mouth every 12 (twelve) hours. Patient not taking: Reported on 10/22/2017 04/01/17   Frederica Kuster, PA-C  fluconazole (DIFLUCAN) 150 MG tablet Take 1 tablet (150 mg total) by mouth daily. Repeat in 72 hours if symptoms do not resolve. Patient not taking: Reported on 10/22/2017 04/01/17   Frederica Kuster, PA-C  ondansetron (ZOFRAN) 4 MG tablet Take 1 tablet  (4 mg total) by mouth every 6 (six) hours. Patient not taking: Reported on 10/22/2017 04/01/17   Frederica Kuster, PA-C    Physical Exam: Vitals:   10/22/17 1230 10/22/17 1300 10/22/17 1330 10/22/17 1400  BP: 110/78 122/81 112/73 112/79  Pulse: 77 64 71 80  Resp: 20 16 19 18   Temp:      TempSrc:      SpO2: 97% 100% 97% 97%  Weight:      Height:          Constitutional: NAD, calm, comfortable Eyes: PERRL, lids and conjunctivae normal ENMT: Mucous membranes are moist. Posterior pharynx clear of any exudate or lesions.Normal dentition.  Neck: normal, supple, no masses, no thyromegaly Respiratory: clear to auscultation bilaterally, no wheezing, no crackles. Normal respiratory effort. No accessory muscle use.  Cardiovascular: Regular rate and rhythm, no murmurs / rubs / gallops. No extremity edema. 2+ pedal pulses. No carotid bruits.  Abdomen: no tenderness, no masses palpated. No hepatosplenomegaly. Bowel sounds positive.  Musculoskeletal: no clubbing / cyanosis. No joint deformity upper and lower extremities. Good ROM, no contractures. Normal muscle tone.  Skin: no rashes, lesions, ulcers. No induration Neurologic: CN 2-12 grossly intact. Sensation intact, DTR normal. Strength 5/5 x all 4 extremities.  Of note when patient turned head to left and look downward she had subtle difficulty with independent lifting of right leg with decreased flexor and extensor strength which was normal when gaze was forward. Psychiatric: Normal judgment and insight. Alert and oriented x 3. Normal mood.    Labs on Admission: I have personally reviewed following labs and imaging studies  CBC: Recent Labs  Lab 10/22/17 0544  WBC 6.8  NEUTROABS 3.4  HGB 12.9  HCT 37.0  MCV 93.0  PLT 144   Basic Metabolic Panel: Recent Labs  Lab 10/22/17 0544  NA 136  K 3.1*  CL 104  CO2 24  GLUCOSE 99  BUN 8  CREATININE 0.68  CALCIUM 8.7*   GFR: Estimated Creatinine Clearance: 101 mL/min (by C-G formula  based on SCr of 0.68 mg/dL). Liver Function Tests: No results for input(s): AST, ALT, ALKPHOS, BILITOT, PROT, ALBUMIN in the last 168 hours. No results for input(s): LIPASE, AMYLASE in the last 168 hours. No results for input(s): AMMONIA in the last 168 hours. Coagulation Profile: No results for input(s): INR, PROTIME in the last 168 hours. Cardiac Enzymes: Recent Labs  Lab 10/22/17 0544  TROPONINI <0.03   BNP (last 3  results) No results for input(s): PROBNP in the last 8760 hours. HbA1C: No results for input(s): HGBA1C in the last 72 hours. CBG: No results for input(s): GLUCAP in the last 168 hours. Lipid Profile: No results for input(s): CHOL, HDL, LDLCALC, TRIG, CHOLHDL, LDLDIRECT in the last 72 hours. Thyroid Function Tests: No results for input(s): TSH, T4TOTAL, FREET4, T3FREE, THYROIDAB in the last 72 hours. Anemia Panel: No results for input(s): VITAMINB12, FOLATE, FERRITIN, TIBC, IRON, RETICCTPCT in the last 72 hours. Urine analysis:    Component Value Date/Time   COLORURINE YELLOW 10/22/2017 [place] 10/22/2017 0553   LABSPEC 1.020 10/22/2017 0553   PHURINE 5.5 10/22/2017 [place] 10/22/2017 0553   HGBUR NEGATIVE 10/22/2017 0553   BILIRUBINUR NEGATIVE 10/22/2017 0553   BILIRUBINUR neg 12/05/2016 1148   [place] 10/22/2017 0553   PROTEINUR NEGATIVE 10/22/2017 0553   UROBILINOGEN negative (A) 12/05/2016 1148   UROBILINOGEN 0.2 12/26/2012 1623   NITRITE NEGATIVE 10/22/2017 0553   LEUKOCYTESUR NEGATIVE 10/22/2017 0553   Sepsis Labs: @LABRCNTIP (procalcitonin:4,lacticidven:4) )No results found for this or any previous visit (from the past 240 hour(s)).   Radiological Exams on Admission: Ct Angio Head W Or Wo Contrast  Result Date: 10/22/2017 CLINICAL DATA:  37 year old female with sudden onset dizziness, tachycardia, syncope. Multiple sclerosis. EXAM: CT ANGIOGRAPHY HEAD AND NECK TECHNIQUE: Multidetector CT imaging of the  head and neck was performed using the standard protocol during bolus administration of intravenous contrast. Multiplanar CT image reconstructions and MIPs were obtained to evaluate the vascular anatomy. Carotid stenosis measurements (when applicable) are obtained utilizing NASCET criteria, using the distal internal carotid diameter as the denominator. CONTRAST:  115mL ISOVUE-370 IOPAMIDOL (ISOVUE-370) INJECTION 76% COMPARISON:  Brain MRI 07/13/2017, cervical spine 07/13/2017, and earlier. FINDINGS: CT HEAD Brain: Cerebral volume is stable and within normal limits. No midline shift, ventriculomegaly, mass effect, evidence of mass lesion, intracranial hemorrhage or evidence of cortically based acute infarction. The periventricular signal abnormality on MRI is occult on CT. Gray-white matter differentiation appears within normal limits throughout. No cortical encephalomalacia. Calvarium and skull base: Negative. Paranasal sinuses: Clear. Orbits: Visualized orbits and scalp soft tissues are within normal limits. CTA NECK Skeleton: Negative. Upper chest: Negative visible lungs. Normal visible superior mediastinum. Other neck: Negative.  No cervical lymphadenopathy. Aortic arch: 3 vessel arch configuration. No arch atherosclerosis. Normal great vessel origins. Right carotid system: Normal. Left carotid system: Normal. Vertebral arteries: Normal proximal right subclavian artery and right vertebral artery origin. Normal right vertebral artery to the skull base. Normal proximal left subclavian artery and left vertebral artery origin. The left vertebral artery is mildly dominant, and normal to the skull base. CTA HEAD Posterior circulation: Mildly dominant distal left vertebral artery. No distal vertebral artery abnormality. Normal right PICA origin. The left SCA appears to be dominant. Patent vertebrobasilar junction. Patent basilar artery without stenosis. Normal SCA and PCA origins. Posterior communicating arteries are  diminutive or absent. Bilateral PCA branches appear normal. Anterior circulation: Patent ICA siphons with no atherosclerosis or stenosis. Patent carotid termini. Normal MCA and ACA origins. Mildly tortuous A1 segments. Anterior communicating artery and bilateral ACA branches are within normal limits. Left MCA M1 segment, trifurcation, and left MCA branches are within normal limits. Right MCA M1 segment, bifurcation, and right MCA branches are within normal limits. Venous sinuses: Patent. Anatomic variants: Mildly dominant left vertebral artery. Delayed phase: No abnormal enhancement identified. Review of the MIP images confirms the above findings IMPRESSION: 1. Negative CTA Head  and Neck, with no arterial abnormality identified. 2. Normal CT appearance of the brain, the multiple sclerosis associated white matter disease is occult by CT. Electronically Signed   By: Genevie Ann M.D.   On: 10/22/2017 07:01   Ct Angio Neck W And/or Wo Contrast  Result Date: 10/22/2017 CLINICAL DATA:  37 year old female with sudden onset dizziness, tachycardia, syncope. Multiple sclerosis. EXAM: CT ANGIOGRAPHY HEAD AND NECK TECHNIQUE: Multidetector CT imaging of the head and neck was performed using the standard protocol during bolus administration of intravenous contrast. Multiplanar CT image reconstructions and MIPs were obtained to evaluate the vascular anatomy. Carotid stenosis measurements (when applicable) are obtained utilizing NASCET criteria, using the distal internal carotid diameter as the denominator. CONTRAST:  140mL ISOVUE-370 IOPAMIDOL (ISOVUE-370) INJECTION 76% COMPARISON:  Brain MRI 07/13/2017, cervical spine 07/13/2017, and earlier. FINDINGS: CT HEAD Brain: Cerebral volume is stable and within normal limits. No midline shift, ventriculomegaly, mass effect, evidence of mass lesion, intracranial hemorrhage or evidence of cortically based acute infarction. The periventricular signal abnormality on MRI is occult on CT.  Gray-white matter differentiation appears within normal limits throughout. No cortical encephalomalacia. Calvarium and skull base: Negative. Paranasal sinuses: Clear. Orbits: Visualized orbits and scalp soft tissues are within normal limits. CTA NECK Skeleton: Negative. Upper chest: Negative visible lungs. Normal visible superior mediastinum. Other neck: Negative.  No cervical lymphadenopathy. Aortic arch: 3 vessel arch configuration. No arch atherosclerosis. Normal great vessel origins. Right carotid system: Normal. Left carotid system: Normal. Vertebral arteries: Normal proximal right subclavian artery and right vertebral artery origin. Normal right vertebral artery to the skull base. Normal proximal left subclavian artery and left vertebral artery origin. The left vertebral artery is mildly dominant, and normal to the skull base. CTA HEAD Posterior circulation: Mildly dominant distal left vertebral artery. No distal vertebral artery abnormality. Normal right PICA origin. The left SCA appears to be dominant. Patent vertebrobasilar junction. Patent basilar artery without stenosis. Normal SCA and PCA origins. Posterior communicating arteries are diminutive or absent. Bilateral PCA branches appear normal. Anterior circulation: Patent ICA siphons with no atherosclerosis or stenosis. Patent carotid termini. Normal MCA and ACA origins. Mildly tortuous A1 segments. Anterior communicating artery and bilateral ACA branches are within normal limits. Left MCA M1 segment, trifurcation, and left MCA branches are within normal limits. Right MCA M1 segment, bifurcation, and right MCA branches are within normal limits. Venous sinuses: Patent. Anatomic variants: Mildly dominant left vertebral artery. Delayed phase: No abnormal enhancement identified. Review of the MIP images confirms the above findings IMPRESSION: 1. Negative CTA Head and Neck, with no arterial abnormality identified. 2. Normal CT appearance of the brain, the  multiple sclerosis associated white matter disease is occult by CT. Electronically Signed   By: Genevie Ann M.D.   On: 10/22/2017 07:01   Mr Brain W And Wo Contrast  Result Date: 10/22/2017 CLINICAL DATA:  Multiple sclerosis. Dizziness and near-syncope when turning head. New neurologic event. EXAM: MRI HEAD WITHOUT AND WITH CONTRAST MRI CERVICAL SPINE WITHOUT AND WITH CONTRAST TECHNIQUE: Multiplanar, multiecho pulse sequences of the brain and surrounding structures, and cervical spine, to include the craniocervical junction and cervicothoracic junction, were obtained without and with intravenous contrast. CONTRAST:  27mL MULTIHANCE GADOBENATE DIMEGLUMINE 529 MG/ML IV SOLN COMPARISON:  MRI of the brain and cervical spine 07/13/2017. FINDINGS: MRI HEAD FINDINGS Brain: Periventricular T2 signal changes are stable from the prior study. There is some involvement of the corpus callosum. No new lesions are present. There is no restricted diffusion  or enhancement. No acute infarct, hemorrhage, or mass lesion is present. The ventricles are of normal size. No significant extra-axial fluid collection is present. Postcontrast images demonstrate no pathologic enhancement. Vascular: Flow is present in the major intracranial arteries. Skull and upper cervical spine: Skull base is normal. Craniocervical junction is normal. Marrow signal is unremarkable. Sinuses/Orbits: The paranasal sinuses and mastoid air cells are clear. Globes and orbits are within normal limits. MRI CERVICAL SPINE FINDINGS Alignment: AP alignment is anatomic. Vertebrae: Marrow signal and vertebral body heights are normal. Cord: New T2 signal changes are present in the left hemi cord at the C2 level. This extends 2 cm cephalo caudad is noted laterally and anteriorly. There is no associated enhancement. A smaller 11 mm cephalo caudad area of T2 signal changes present in the far right lateral hemi cord at the C2-3 level. No other focal cord signal changes are  present to the lowest imaged level T2-3. Posterior Fossa, vertebral arteries, paraspinal tissues: The craniocervical junction is normal. Flow is present in the vertebral arteries bilaterally. Disc levels: A mild leftward disc osteophyte complex is again noted at C5-6. No significant stenosis is present. No other significant focal disc disease is present. IMPRESSION: 1. New T2 signal changes in the left hemicord at C2 extending 2 cm cephalo caudad. 2. Smaller new area of T2 signal change in the far right lateral hemi cord at C2-3 measuring 11 mm cephalo caudad. These lesions likely represent new areas of demyelination within the spinal cord. 3. Intracranial periventricular T2 signal changes are stable. 4. No acute intracranial abnormality. Electronically Signed   By: San Morelle M.D.   On: 10/22/2017 12:16   Mr Cervical Spine W Or Wo Contrast  Result Date: 10/22/2017 CLINICAL DATA:  Multiple sclerosis. Dizziness and near-syncope when turning head. New neurologic event. EXAM: MRI HEAD WITHOUT AND WITH CONTRAST MRI CERVICAL SPINE WITHOUT AND WITH CONTRAST TECHNIQUE: Multiplanar, multiecho pulse sequences of the brain and surrounding structures, and cervical spine, to include the craniocervical junction and cervicothoracic junction, were obtained without and with intravenous contrast. CONTRAST:  59mL MULTIHANCE GADOBENATE DIMEGLUMINE 529 MG/ML IV SOLN COMPARISON:  MRI of the brain and cervical spine 07/13/2017. FINDINGS: MRI HEAD FINDINGS Brain: Periventricular T2 signal changes are stable from the prior study. There is some involvement of the corpus callosum. No new lesions are present. There is no restricted diffusion or enhancement. No acute infarct, hemorrhage, or mass lesion is present. The ventricles are of normal size. No significant extra-axial fluid collection is present. Postcontrast images demonstrate no pathologic enhancement. Vascular: Flow is present in the major intracranial arteries. Skull  and upper cervical spine: Skull base is normal. Craniocervical junction is normal. Marrow signal is unremarkable. Sinuses/Orbits: The paranasal sinuses and mastoid air cells are clear. Globes and orbits are within normal limits. MRI CERVICAL SPINE FINDINGS Alignment: AP alignment is anatomic. Vertebrae: Marrow signal and vertebral body heights are normal. Cord: New T2 signal changes are present in the left hemi cord at the C2 level. This extends 2 cm cephalo caudad is noted laterally and anteriorly. There is no associated enhancement. A smaller 11 mm cephalo caudad area of T2 signal changes present in the far right lateral hemi cord at the C2-3 level. No other focal cord signal changes are present to the lowest imaged level T2-3. Posterior Fossa, vertebral arteries, paraspinal tissues: The craniocervical junction is normal. Flow is present in the vertebral arteries bilaterally. Disc levels: A mild leftward disc osteophyte complex is again noted at C5-6.  No significant stenosis is present. No other significant focal disc disease is present. IMPRESSION: 1. New T2 signal changes in the left hemicord at C2 extending 2 cm cephalo caudad. 2. Smaller new area of T2 signal change in the far right lateral hemi cord at C2-3 measuring 11 mm cephalo caudad. These lesions likely represent new areas of demyelination within the spinal cord. 3. Intracranial periventricular T2 signal changes are stable. 4. No acute intracranial abnormality. Electronically Signed   By: San Morelle M.D.   On: 10/22/2017 12:16    EKG: (Independently reviewed) sinus tachycardia with ventricular rate 104 bpm, QTC 437 ms, normal R wave rotation, no acute ischemic changes-unchanged from previous EKGs  Assessment/Plan Principal Problem:   Multiple sclerosis exacerbation  -Patient presents after initial reports of near syncope with negative CTA with subsequent history of subjective sensation of right thigh numbness when turning head to right  and left leg weakness and sensation of "giving way when attempting to stand" when turning head to left and gazing downward -MRI brain consistent with new demyelination lesions at the C2-3 level -Patient not on disease modifying drugs due to desire to conceive-previously on Copaxone -Followed by neurology at [facility]/Dr. Sullivan neurology assistance -Has never required high-dose steroids for treatment and has previously done well on oral prednisone -Patient will receive Solu-Medrol 1 g daily for a total of 5 doses plus Protonix 40 mg p.o. daily  Active Problems:   Acute hypokalemia -Oral replacement -Follow labs    History of Gestational diabetes -Occurred with second pregnancy -Check a.m. CBGs with utilization of high-dose steroids-developed significant hyperglycemia may need to check more frequently    POTS (postural orthostatic tachycardia syndrome) vs SVT (supraventricular tachycardia)  -Patient thinks that she does not truly have pots and instead has SVT -Prior failed ablation in 2003 -Since that time she is only had one significant episode of SVT that responded to medications prior to induction for cardioversion    **Additional lab, imaging and/or diagnostic evaluation at discretion of supervising physician  DVT prophylaxis: Lovenox Code Status: Full Family Communication: No family at bedside Disposition Plan: Home Consults called: Neurology/[place]    Karen Powers ANP-BC [facility] Pager [phone]   If 7PM-7AM, please contact night-coverage [email or web address] Password [facility]  10/22/2017, 3:15 PM

## 2017-10-23 ENCOUNTER — Other Ambulatory Visit: Payer: Self-pay

## 2017-10-23 ENCOUNTER — Encounter (HOSPITAL_COMMUNITY): Payer: Self-pay | Admitting: General Practice

## 2017-10-23 LAB — GLUCOSE, CAPILLARY: Glucose-Capillary: 176 mg/dL — ABNORMAL HIGH (ref 65–99)

## 2017-10-23 LAB — HIV ANTIBODY (ROUTINE TESTING W REFLEX): HIV Screen 4th Generation wRfx: NONREACTIVE

## 2017-10-23 LAB — CBC
HCT: 39.5 % (ref 36.0–46.0)
Hemoglobin: 13.5 g/dL (ref 12.0–15.0)
MCH: 32.2 pg (ref 26.0–34.0)
MCHC: 34.2 g/dL (ref 30.0–36.0)
MCV: 94.3 fL (ref 78.0–100.0)
Platelets: 226 10*3/uL (ref 150–400)
RBC: 4.19 MIL/uL (ref 3.87–5.11)
RDW: 12.3 % (ref 11.5–15.5)
WBC: 14.7 10*3/uL — ABNORMAL HIGH (ref 4.0–10.5)

## 2017-10-23 LAB — BASIC METABOLIC PANEL
Anion gap: 10 (ref 5–15)
BUN: 7 mg/dL (ref 6–20)
CO2: 20 mmol/L — ABNORMAL LOW (ref 22–32)
Calcium: 8.9 mg/dL (ref 8.9–10.3)
Chloride: 106 mmol/L (ref 101–111)
Creatinine, Ser: 0.72 mg/dL (ref 0.44–1.00)
GFR calc Af Amer: >60 mL/min (ref >60)
GFR calc non Af Amer: >60 mL/min (ref >60)
Glucose, Bld: 185 mg/dL — ABNORMAL HIGH (ref 65–99)
Potassium: 4 mmol/L (ref 3.5–5.1)
Sodium: 136 mmol/L (ref 135–145)

## 2017-10-23 MED ORDER — PANTOPRAZOLE SODIUM 40 MG PO TBEC
40.0000 mg | DELAYED_RELEASE_TABLET | Freq: Every day | ORAL | Status: DC
  Administered 2017-10-23 – 2017-10-24 (×2): 40 mg via ORAL
  Filled 2017-10-23 (×2): qty 1

## 2017-10-23 MED ORDER — DIPHENHYDRAMINE HCL 25 MG PO CAPS
50.0000 mg | ORAL_CAPSULE | Freq: Every evening | ORAL | Status: DC | PRN
  Administered 2017-10-23: 50 mg via ORAL
  Filled 2017-10-23: qty 2

## 2017-10-23 NOTE — Progress Notes (Signed)
PROGRESS NOTE    Karen Powers  PJA:[id number] DOB: 08/19/1981 DOA: 10/22/2017 PCP: Mackie Pai, PA-C    Brief Narrative:  37 y.o. female with a Past Medical History of MS, SVT, and gestational diabetes who presents with vague neurologic symptoms. Currently being treated by neurology with high dose IV methylprednisone.    Assessment & Plan:   Principal Problem:   Multiple sclerosis exacerbation ([place]) - Plan for last neurology note is for 3 days of IV methylprednisone - Patient to follow-up with neurologist as outpatient to start DMD - Neurology consulted yesterday  Active Problems:   Acute hypokalemia - Last potassium level IV.0   DVT prophylaxis: Lovenox Code Status: Full Family Communication: Discussed with patient and present family members at bedside Disposition Plan: As per recommendations by neurologist  Consultants:   Neurology   Procedures: None   Antimicrobials: None   Subjective: Patient has no new complaints  Objective: Vitals:   10/23/17 0036 10/23/17 0420 10/23/17 0800 10/23/17 1127  BP: 131/82 (!) 101/50 114/61 116/69  Pulse: 85 64 85 89  Resp: 18 18 20 20   Temp: 98.6 F (37 C) 98.3 F (36.8 C) 98.6 F (37 C) 98.4 F (36.9 C)  TempSrc: Oral Oral Oral Oral  SpO2: 99% 99% 98% 96%  Weight:      Height:        Intake/Output Summary (Last 24 hours) at 10/23/2017 1433 Last data filed at 10/23/2017 1300 Gross per 24 hour  Intake 720 ml  Output -  Net 720 ml   Filed Weights   10/22/17 0542  Weight: 65.8 kg (145 lb)    Examination:  General exam: Appears calm and comfortable  Respiratory system: Clear to auscultation. Respiratory effort normal. Cardiovascular system: S1 & S2 heard, RRR. No JVD Gastrointestinal system: Abdomen is nondistended, soft and nontender.  Central nervous system: Alert and oriented. Answers questions appropriately Extremities: Moves extremities equally  Data Reviewed: I have personally reviewed  following labs and imaging studies  CBC: Recent Labs  Lab 10/22/17 0544 10/23/17 0839  WBC 6.8 14.7*  NEUTROABS 3.4  --   HGB 12.9 13.5  HCT 37.0 39.5  MCV 93.0 94.3  PLT 174 341   Basic Metabolic Panel: Recent Labs  Lab 10/22/17 0544 10/23/17 0839  NA 136 136  K 3.1* 4.0  CL 104 106  CO2 24 20*  GLUCOSE 99 185*  BUN 8 7  CREATININE 0.68 0.72  CALCIUM 8.7* 8.9   GFR: Estimated Creatinine Clearance: 101 mL/min (by C-G formula based on SCr of 0.72 mg/dL). Liver Function Tests: No results for input(s): AST, ALT, ALKPHOS, BILITOT, PROT, ALBUMIN in the last 168 hours. No results for input(s): LIPASE, AMYLASE in the last 168 hours. No results for input(s): AMMONIA in the last 168 hours. Coagulation Profile: No results for input(s): INR, PROTIME in the last 168 hours. Cardiac Enzymes: Recent Labs  Lab 10/22/17 0544  TROPONINI <0.03   BNP (last 3 results) No results for input(s): PROBNP in the last 8760 hours. HbA1C: No results for input(s): HGBA1C in the last 72 hours. CBG: Recent Labs  Lab 10/23/17 0606  GLUCAP 176*   Lipid Profile: No results for input(s): CHOL, HDL, LDLCALC, TRIG, CHOLHDL, LDLDIRECT in the last 72 hours. Thyroid Function Tests: No results for input(s): TSH, T4TOTAL, FREET4, T3FREE, THYROIDAB in the last 72 hours. Anemia Panel: No results for input(s): VITAMINB12, FOLATE, FERRITIN, TIBC, IRON, RETICCTPCT in the last 72 hours. Sepsis Labs: No results for input(s): PROCALCITON, LATICACIDVEN  in the last 168 hours.  No results found for this or any previous visit (from the past 240 hour(s)).       Radiology Studies: Ct Angio Head W Or Wo Contrast  Result Date: 10/22/2017 CLINICAL DATA:  37 year old female with sudden onset dizziness, tachycardia, syncope. Multiple sclerosis. EXAM: CT ANGIOGRAPHY HEAD AND NECK TECHNIQUE: Multidetector CT imaging of the head and neck was performed using the standard protocol during bolus administration of  intravenous contrast. Multiplanar CT image reconstructions and MIPs were obtained to evaluate the vascular anatomy. Carotid stenosis measurements (when applicable) are obtained utilizing NASCET criteria, using the distal internal carotid diameter as the denominator. CONTRAST:  150mL ISOVUE-370 IOPAMIDOL (ISOVUE-370) INJECTION 76% COMPARISON:  Brain MRI 07/13/2017, cervical spine 07/13/2017, and earlier. FINDINGS: CT HEAD Brain: Cerebral volume is stable and within normal limits. No midline shift, ventriculomegaly, mass effect, evidence of mass lesion, intracranial hemorrhage or evidence of cortically based acute infarction. The periventricular signal abnormality on MRI is occult on CT. Gray-white matter differentiation appears within normal limits throughout. No cortical encephalomalacia. Calvarium and skull base: Negative. Paranasal sinuses: Clear. Orbits: Visualized orbits and scalp soft tissues are within normal limits. CTA NECK Skeleton: Negative. Upper chest: Negative visible lungs. Normal visible superior mediastinum. Other neck: Negative.  No cervical lymphadenopathy. Aortic arch: 3 vessel arch configuration. No arch atherosclerosis. Normal great vessel origins. Right carotid system: Normal. Left carotid system: Normal. Vertebral arteries: Normal proximal right subclavian artery and right vertebral artery origin. Normal right vertebral artery to the skull base. Normal proximal left subclavian artery and left vertebral artery origin. The left vertebral artery is mildly dominant, and normal to the skull base. CTA HEAD Posterior circulation: Mildly dominant distal left vertebral artery. No distal vertebral artery abnormality. Normal right PICA origin. The left SCA appears to be dominant. Patent vertebrobasilar junction. Patent basilar artery without stenosis. Normal SCA and PCA origins. Posterior communicating arteries are diminutive or absent. Bilateral PCA branches appear normal. Anterior circulation: Patent  ICA siphons with no atherosclerosis or stenosis. Patent carotid termini. Normal MCA and ACA origins. Mildly tortuous A1 segments. Anterior communicating artery and bilateral ACA branches are within normal limits. Left MCA M1 segment, trifurcation, and left MCA branches are within normal limits. Right MCA M1 segment, bifurcation, and right MCA branches are within normal limits. Venous sinuses: Patent. Anatomic variants: Mildly dominant left vertebral artery. Delayed phase: No abnormal enhancement identified. Review of the MIP images confirms the above findings IMPRESSION: 1. Negative CTA Head and Neck, with no arterial abnormality identified. 2. Normal CT appearance of the brain, the multiple sclerosis associated white matter disease is occult by CT. Electronically Signed   By: Genevie Ann M.D.   On: 10/22/2017 07:01   Ct Angio Neck W And/or Wo Contrast  Result Date: 10/22/2017 CLINICAL DATA:  37 year old female with sudden onset dizziness, tachycardia, syncope. Multiple sclerosis. EXAM: CT ANGIOGRAPHY HEAD AND NECK TECHNIQUE: Multidetector CT imaging of the head and neck was performed using the standard protocol during bolus administration of intravenous contrast. Multiplanar CT image reconstructions and MIPs were obtained to evaluate the vascular anatomy. Carotid stenosis measurements (when applicable) are obtained utilizing NASCET criteria, using the distal internal carotid diameter as the denominator. CONTRAST:  137mL ISOVUE-370 IOPAMIDOL (ISOVUE-370) INJECTION 76% COMPARISON:  Brain MRI 07/13/2017, cervical spine 07/13/2017, and earlier. FINDINGS: CT HEAD Brain: Cerebral volume is stable and within normal limits. No midline shift, ventriculomegaly, mass effect, evidence of mass lesion, intracranial hemorrhage or evidence of cortically based acute infarction. The  periventricular signal abnormality on MRI is occult on CT. Gray-white matter differentiation appears within normal limits throughout. No cortical  encephalomalacia. Calvarium and skull base: Negative. Paranasal sinuses: Clear. Orbits: Visualized orbits and scalp soft tissues are within normal limits. CTA NECK Skeleton: Negative. Upper chest: Negative visible lungs. Normal visible superior mediastinum. Other neck: Negative.  No cervical lymphadenopathy. Aortic arch: 3 vessel arch configuration. No arch atherosclerosis. Normal great vessel origins. Right carotid system: Normal. Left carotid system: Normal. Vertebral arteries: Normal proximal right subclavian artery and right vertebral artery origin. Normal right vertebral artery to the skull base. Normal proximal left subclavian artery and left vertebral artery origin. The left vertebral artery is mildly dominant, and normal to the skull base. CTA HEAD Posterior circulation: Mildly dominant distal left vertebral artery. No distal vertebral artery abnormality. Normal right PICA origin. The left SCA appears to be dominant. Patent vertebrobasilar junction. Patent basilar artery without stenosis. Normal SCA and PCA origins. Posterior communicating arteries are diminutive or absent. Bilateral PCA branches appear normal. Anterior circulation: Patent ICA siphons with no atherosclerosis or stenosis. Patent carotid termini. Normal MCA and ACA origins. Mildly tortuous A1 segments. Anterior communicating artery and bilateral ACA branches are within normal limits. Left MCA M1 segment, trifurcation, and left MCA branches are within normal limits. Right MCA M1 segment, bifurcation, and right MCA branches are within normal limits. Venous sinuses: Patent. Anatomic variants: Mildly dominant left vertebral artery. Delayed phase: No abnormal enhancement identified. Review of the MIP images confirms the above findings IMPRESSION: 1. Negative CTA Head and Neck, with no arterial abnormality identified. 2. Normal CT appearance of the brain, the multiple sclerosis associated white matter disease is occult by CT. Electronically Signed    By: Genevie Ann M.D.   On: 10/22/2017 07:01   Mr Brain W And Wo Contrast  Result Date: 10/22/2017 CLINICAL DATA:  Multiple sclerosis. Dizziness and near-syncope when turning head. New neurologic event. EXAM: MRI HEAD WITHOUT AND WITH CONTRAST MRI CERVICAL SPINE WITHOUT AND WITH CONTRAST TECHNIQUE: Multiplanar, multiecho pulse sequences of the brain and surrounding structures, and cervical spine, to include the craniocervical junction and cervicothoracic junction, were obtained without and with intravenous contrast. CONTRAST:  25mL MULTIHANCE GADOBENATE DIMEGLUMINE 529 MG/ML IV SOLN COMPARISON:  MRI of the brain and cervical spine 07/13/2017. FINDINGS: MRI HEAD FINDINGS Brain: Periventricular T2 signal changes are stable from the prior study. There is some involvement of the corpus callosum. No new lesions are present. There is no restricted diffusion or enhancement. No acute infarct, hemorrhage, or mass lesion is present. The ventricles are of normal size. No significant extra-axial fluid collection is present. Postcontrast images demonstrate no pathologic enhancement. Vascular: Flow is present in the major intracranial arteries. Skull and upper cervical spine: Skull base is normal. Craniocervical junction is normal. Marrow signal is unremarkable. Sinuses/Orbits: The paranasal sinuses and mastoid air cells are clear. Globes and orbits are within normal limits. MRI CERVICAL SPINE FINDINGS Alignment: AP alignment is anatomic. Vertebrae: Marrow signal and vertebral body heights are normal. Cord: New T2 signal changes are present in the left hemi cord at the C2 level. This extends 2 cm cephalo caudad is noted laterally and anteriorly. There is no associated enhancement. A smaller 11 mm cephalo caudad area of T2 signal changes present in the far right lateral hemi cord at the C2-3 level. No other focal cord signal changes are present to the lowest imaged level T2-3. Posterior Fossa, vertebral arteries, paraspinal  tissues: The craniocervical junction is normal. Flow is  present in the vertebral arteries bilaterally. Disc levels: A mild leftward disc osteophyte complex is again noted at C5-6. No significant stenosis is present. No other significant focal disc disease is present. IMPRESSION: 1. New T2 signal changes in the left hemicord at C2 extending 2 cm cephalo caudad. 2. Smaller new area of T2 signal change in the far right lateral hemi cord at C2-3 measuring 11 mm cephalo caudad. These lesions likely represent new areas of demyelination within the spinal cord. 3. Intracranial periventricular T2 signal changes are stable. 4. No acute intracranial abnormality. Electronically Signed   By: San Morelle M.D.   On: 10/22/2017 12:16   Mr Cervical Spine W Or Wo Contrast  Result Date: 10/22/2017 CLINICAL DATA:  Multiple sclerosis. Dizziness and near-syncope when turning head. New neurologic event. EXAM: MRI HEAD WITHOUT AND WITH CONTRAST MRI CERVICAL SPINE WITHOUT AND WITH CONTRAST TECHNIQUE: Multiplanar, multiecho pulse sequences of the brain and surrounding structures, and cervical spine, to include the craniocervical junction and cervicothoracic junction, were obtained without and with intravenous contrast. CONTRAST:  18mL MULTIHANCE GADOBENATE DIMEGLUMINE 529 MG/ML IV SOLN COMPARISON:  MRI of the brain and cervical spine 07/13/2017. FINDINGS: MRI HEAD FINDINGS Brain: Periventricular T2 signal changes are stable from the prior study. There is some involvement of the corpus callosum. No new lesions are present. There is no restricted diffusion or enhancement. No acute infarct, hemorrhage, or mass lesion is present. The ventricles are of normal size. No significant extra-axial fluid collection is present. Postcontrast images demonstrate no pathologic enhancement. Vascular: Flow is present in the major intracranial arteries. Skull and upper cervical spine: Skull base is normal. Craniocervical junction is normal. Marrow  signal is unremarkable. Sinuses/Orbits: The paranasal sinuses and mastoid air cells are clear. Globes and orbits are within normal limits. MRI CERVICAL SPINE FINDINGS Alignment: AP alignment is anatomic. Vertebrae: Marrow signal and vertebral body heights are normal. Cord: New T2 signal changes are present in the left hemi cord at the C2 level. This extends 2 cm cephalo caudad is noted laterally and anteriorly. There is no associated enhancement. A smaller 11 mm cephalo caudad area of T2 signal changes present in the far right lateral hemi cord at the C2-3 level. No other focal cord signal changes are present to the lowest imaged level T2-3. Posterior Fossa, vertebral arteries, paraspinal tissues: The craniocervical junction is normal. Flow is present in the vertebral arteries bilaterally. Disc levels: A mild leftward disc osteophyte complex is again noted at C5-6. No significant stenosis is present. No other significant focal disc disease is present. IMPRESSION: 1. New T2 signal changes in the left hemicord at C2 extending 2 cm cephalo caudad. 2. Smaller new area of T2 signal change in the far right lateral hemi cord at C2-3 measuring 11 mm cephalo caudad. These lesions likely represent new areas of demyelination within the spinal cord. 3. Intracranial periventricular T2 signal changes are stable. 4. No acute intracranial abnormality. Electronically Signed   By: San Morelle M.D.   On: 10/22/2017 12:16        Scheduled Meds: . cholecalciferol  2,000 Units Oral Daily  . enoxaparin (LOVENOX) injection  40 mg Subcutaneous Q24H  . Norgestimate-Ethinyl Estradiol Triphasic  1 tablet Oral Daily  . pantoprazole  40 mg Oral Daily  . sodium chloride flush  3 mL Intravenous Q12H   Continuous Infusions: . methylPREDNISolone (SOLU-MEDROL) injection Stopped (10/23/17 1128)     LOS: 1 day    Time spent: > 35 minutes  Velvet Bathe,  MD [facility] Pager [phone]  If 7PM-7AM, please  contact night-coverage [email or web address] Password [facility] 10/23/2017, 2:33 PM

## 2017-10-24 LAB — GLUCOSE, CAPILLARY: Glucose-Capillary: 122 mg/dL — ABNORMAL HIGH (ref 65–99)

## 2017-10-24 NOTE — Progress Notes (Signed)
Pt being discharged home via wheelchair with family. Pt alert and oriented x4. VSS. Pt c/o no pain at this time. No signs of respiratory distress. Education complete and care plans resolved. IV removed with catheter intact and pt tolerated well. No further issues at this time. Pt to follow up with PCP. Areana Kosanke R, RN 

## 2017-10-24 NOTE — Progress Notes (Signed)
PROGRESS NOTE    Karen Powers  AJO:[id number] DOB: 02/03/1981 DOA: 10/22/2017 PCP: Mackie Pai, PA-C    Brief Narrative:  37 y.o. female with a Past Medical History of MS, SVT, and gestational diabetes who presents with vague neurologic symptoms. Currently being treated by neurology with high dose IV methylprednisone.    Assessment & Plan:   Principal Problem:   Multiple sclerosis exacerbation ([place]) - Plan for last neurology note is for 3 days of IV methylprednisone. Pt reports that today is the third dose. I have reconsulted neurology for further evaluation.   Active Problems:   Acute hypokalemia - resolved   DVT prophylaxis: Lovenox Code Status: Full Family Communication: Discussed with patient and present family members at bedside Disposition Plan: As per recommendations by neurologist  Consultants:   Neurology   Procedures: None   Antimicrobials: None   Subjective: Patient reports not feeling much better.   Objective: Vitals:   10/24/17 0000 10/24/17 0400 10/24/17 0824 10/24/17 1203  BP: (!) 96/45 (!) 101/53 113/69 116/67  Pulse: 78 72 87 80  Resp: 18 18 18 18   Temp: 98.9 F (37.2 C) 98.6 F (37 C) 98.7 F (37.1 C) 99.1 F (37.3 C)  TempSrc: Oral Oral Oral Oral  SpO2: 99% 95% 99% 99%  Weight:      Height:        Intake/Output Summary (Last 24 hours) at 10/24/2017 1312 Last data filed at 10/24/2017 1100 Gross per 24 hour  Intake 741 ml  Output -  Net 741 ml   Filed Weights   10/22/17 0542  Weight: 65.8 kg (145 lb)    Examination:  General exam: in NAD,  Appears calm and comfortable  Respiratory system: Clear to auscultation. Respiratory effort normal. Cardiovascular system: S1 & S2 heard, RRR. No JVD Gastrointestinal system: Abdomen is nondistended, soft and nontender.  Central nervous system: Alert and oriented. Answers questions appropriately Extremities: Moves extremities equally  Data Reviewed: I have personally reviewed  following labs and imaging studies  CBC: Recent Labs  Lab 10/22/17 0544 10/23/17 0839  WBC 6.8 14.7*  NEUTROABS 3.4  --   HGB 12.9 13.5  HCT 37.0 39.5  MCV 93.0 94.3  PLT 174 947   Basic Metabolic Panel: Recent Labs  Lab 10/22/17 0544 10/23/17 0839  NA 136 136  K 3.1* 4.0  CL 104 106  CO2 24 20*  GLUCOSE 99 185*  BUN 8 7  CREATININE 0.68 0.72  CALCIUM 8.7* 8.9   GFR: Estimated Creatinine Clearance: 101 mL/min (by C-G formula based on SCr of 0.72 mg/dL). Liver Function Tests: No results for input(s): AST, ALT, ALKPHOS, BILITOT, PROT, ALBUMIN in the last 168 hours. No results for input(s): LIPASE, AMYLASE in the last 168 hours. No results for input(s): AMMONIA in the last 168 hours. Coagulation Profile: No results for input(s): INR, PROTIME in the last 168 hours. Cardiac Enzymes: Recent Labs  Lab 10/22/17 0544  TROPONINI <0.03   BNP (last 3 results) No results for input(s): PROBNP in the last 8760 hours. HbA1C: No results for input(s): HGBA1C in the last 72 hours. CBG: Recent Labs  Lab 10/23/17 0606 10/24/17 0611  GLUCAP 176* 122*   Lipid Profile: No results for input(s): CHOL, HDL, LDLCALC, TRIG, CHOLHDL, LDLDIRECT in the last 72 hours. Thyroid Function Tests: No results for input(s): TSH, T4TOTAL, FREET4, T3FREE, THYROIDAB in the last 72 hours. Anemia Panel: No results for input(s): VITAMINB12, FOLATE, FERRITIN, TIBC, IRON, RETICCTPCT in the last 72 hours. Sepsis  Labs: No results for input(s): PROCALCITON, LATICACIDVEN in the last 168 hours.  No results found for this or any previous visit (from the past 240 hour(s)).    Radiology Studies: No results found.   Scheduled Meds: . cholecalciferol  2,000 Units Oral Daily  . enoxaparin (LOVENOX) injection  40 mg Subcutaneous Q24H  . Norgestimate-Ethinyl Estradiol Triphasic  1 tablet Oral Daily  . pantoprazole  40 mg Oral Daily  . sodium chloride flush  3 mL Intravenous Q12H   Continuous  Infusions: . methylPREDNISolone (SOLU-MEDROL) injection Stopped (10/24/17 0927)     LOS: 2 days    Time spent: > 15 minutes  Velvet Bathe, MD [facility] Pager [phone]  If 7PM-7AM, please contact night-coverage [email or web address] Password [email or web address] 10/24/2017, 1:12 PM

## 2017-10-24 NOTE — Discharge Summary (Signed)
Physician Discharge Summary  Karen Powers YHC:[id number] DOB: 10-21-1980 DOA: 10/22/2017  PCP: Karen Pai, PA-C  Admit date: 10/22/2017 Discharge date: 10/24/2017  Time spent: 10 minutes  Recommendations for Outpatient Follow-up:  F/u with Neurologist for further evaluation and recommendations  Discharge Diagnoses:  Principal Problem:   Multiple sclerosis exacerbation ([name]) Active Problems:   Acute hypokalemia   POTS (postural orthostatic tachycardia syndrome)   SVT (supraventricular tachycardia) ([place])   History of Gestational diabetes   Discharge Condition: stable  Diet recommendation: regular diet  Filed Weights   10/22/17 0542  Weight: 65.8 kg (145 lb)    History of present illness:  37 y.o.femalewith a Past Medical History of MS, SVT, and gestational diabetes who presents with vague neurologic symptoms.   Was treated by neurology with high dose IV methylprednisone x 3 doses  Hospital Course:  MS flair - Treated by neurology with high-dose steroids for 3 days. - Neurology has evaluated today and deemed patient appropriate for hospital discharge. He has spoken of outpatient neurologist and set up follow-up plans. Per my discussion with neurology this was discussed with the patient.  Procedures:  None  Consultations:  Neurology  Discharge Exam: Vitals:   10/24/17 0824 10/24/17 1203  BP: 113/69 116/67  Pulse: 87 80  Resp: 18 18  Temp: 98.7 F (37.1 C) 99.1 F (37.3 C)  SpO2: 99% 99%    General: pt in nad, alert and awake Cardiovascular: rrr, no rubs  Respiratory: no increased wob, no wheezes  Discharge Instructions   Discharge Instructions    Call MD for:  redness, tenderness, or signs of infection (pain, swelling, redness, odor or green/yellow discharge around incision site)   Complete by:  As directed    Call MD for:  temperature >100.4   Complete by:  As directed    Diet - low sodium heart healthy   Complete by:  As directed     Discharge instructions   Complete by:  As directed    Please follow up with your outpatient neurologist for further evaluation and recommendations.   Increase activity slowly   Complete by:  As directed      Allergies as of 10/24/2017      Reactions   Ciprofloxacin Rash   Has patient had a PCN reaction causing immediate rash, facial/tongue/throat swelling, SOB or lightheadedness with hypotension: No Has patient had a PCN reaction causing severe rash involving mucus membranes or skin necrosis: No Has patient had a PCN reaction that required hospitalization No Has patient had a PCN reaction occurring within the last 10 years: Yes If all of the above answers are "NO", then may proceed with Cephalosporin use.      Medication List    STOP taking these medications   fluconazole 150 MG tablet Commonly known as:  DIFLUCAN     TAKE these medications   acetaminophen 500 MG tablet Commonly known as:  TYLENOL Take 1,000 mg by mouth every 6 (six) hours as needed for headache.   amoxicillin-clavulanate 875-125 MG tablet Commonly known as:  AUGMENTIN Take 1 tablet by mouth every 12 (twelve) hours.   ibuprofen 600 MG tablet Commonly known as:  ADVIL,MOTRIN Take 1 tablet (600 mg total) by mouth every 6 (six) hours as needed for mild pain.   ondansetron 4 MG tablet Commonly known as:  ZOFRAN Take 1 tablet (4 mg total) by mouth every 6 (six) hours.   TRI-LO-SPRINTEC 0.18/0.215/0.25 MG-25 MCG tab Generic drug:  Norgestimate-Ethinyl Estradiol Triphasic Take 1  tablet by mouth daily.   Vitamin D 2000 units Caps Take 2,000 Units by mouth daily.   WOMENS MULTIVITAMIN PLUS Tabs Take by mouth daily.      Allergies  Allergen Reactions  . Ciprofloxacin Rash    Has patient had a PCN reaction causing immediate rash, facial/tongue/throat swelling, SOB or lightheadedness with hypotension: No Has patient had a PCN reaction causing severe rash involving mucus membranes or skin necrosis: No Has  patient had a PCN reaction that required hospitalization No Has patient had a PCN reaction occurring within the last 10 years: Yes If all of the above answers are "NO", then may proceed with Cephalosporin use.      The results of significant diagnostics from this hospitalization (including imaging, microbiology, ancillary and laboratory) are listed below for reference.    Significant Diagnostic Studies: Ct Angio Head W Or Wo Contrast  Result Date: 10/22/2017 CLINICAL DATA:  37 year old female with sudden onset dizziness, tachycardia, syncope. Multiple sclerosis. EXAM: CT ANGIOGRAPHY HEAD AND NECK TECHNIQUE: Multidetector CT imaging of the head and neck was performed using the standard protocol during bolus administration of intravenous contrast. Multiplanar CT image reconstructions and MIPs were obtained to evaluate the vascular anatomy. Carotid stenosis measurements (when applicable) are obtained utilizing NASCET criteria, using the distal internal carotid diameter as the denominator. CONTRAST:  176mL ISOVUE-370 IOPAMIDOL (ISOVUE-370) INJECTION 76% COMPARISON:  Brain MRI 07/13/2017, cervical spine 07/13/2017, and earlier. FINDINGS: CT HEAD Brain: Cerebral volume is stable and within normal limits. No midline shift, ventriculomegaly, mass effect, evidence of mass lesion, intracranial hemorrhage or evidence of cortically based acute infarction. The periventricular signal abnormality on MRI is occult on CT. Gray-white matter differentiation appears within normal limits throughout. No cortical encephalomalacia. Calvarium and skull base: Negative. Paranasal sinuses: Clear. Orbits: Visualized orbits and scalp soft tissues are within normal limits. CTA NECK Skeleton: Negative. Upper chest: Negative visible lungs. Normal visible superior mediastinum. Other neck: Negative.  No cervical lymphadenopathy. Aortic arch: 3 vessel arch configuration. No arch atherosclerosis. Normal great vessel origins. Right carotid  system: Normal. Left carotid system: Normal. Vertebral arteries: Normal proximal right subclavian artery and right vertebral artery origin. Normal right vertebral artery to the skull base. Normal proximal left subclavian artery and left vertebral artery origin. The left vertebral artery is mildly dominant, and normal to the skull base. CTA HEAD Posterior circulation: Mildly dominant distal left vertebral artery. No distal vertebral artery abnormality. Normal right PICA origin. The left SCA appears to be dominant. Patent vertebrobasilar junction. Patent basilar artery without stenosis. Normal SCA and PCA origins. Posterior communicating arteries are diminutive or absent. Bilateral PCA branches appear normal. Anterior circulation: Patent ICA siphons with no atherosclerosis or stenosis. Patent carotid termini. Normal MCA and ACA origins. Mildly tortuous A1 segments. Anterior communicating artery and bilateral ACA branches are within normal limits. Left MCA M1 segment, trifurcation, and left MCA branches are within normal limits. Right MCA M1 segment, bifurcation, and right MCA branches are within normal limits. Venous sinuses: Patent. Anatomic variants: Mildly dominant left vertebral artery. Delayed phase: No abnormal enhancement identified. Review of the MIP images confirms the above findings IMPRESSION: 1. Negative CTA Head and Neck, with no arterial abnormality identified. 2. Normal CT appearance of the brain, the multiple sclerosis associated white matter disease is occult by CT. Electronically Signed   By: Genevie Ann M.D.   On: 10/22/2017 07:01   Ct Angio Neck W And/or Wo Contrast  Result Date: 10/22/2017 CLINICAL DATA:  37 year old female with sudden  onset dizziness, tachycardia, syncope. Multiple sclerosis. EXAM: CT ANGIOGRAPHY HEAD AND NECK TECHNIQUE: Multidetector CT imaging of the head and neck was performed using the standard protocol during bolus administration of intravenous contrast. Multiplanar CT image  reconstructions and MIPs were obtained to evaluate the vascular anatomy. Carotid stenosis measurements (when applicable) are obtained utilizing NASCET criteria, using the distal internal carotid diameter as the denominator. CONTRAST:  122mL ISOVUE-370 IOPAMIDOL (ISOVUE-370) INJECTION 76% COMPARISON:  Brain MRI 07/13/2017, cervical spine 07/13/2017, and earlier. FINDINGS: CT HEAD Brain: Cerebral volume is stable and within normal limits. No midline shift, ventriculomegaly, mass effect, evidence of mass lesion, intracranial hemorrhage or evidence of cortically based acute infarction. The periventricular signal abnormality on MRI is occult on CT. Gray-white matter differentiation appears within normal limits throughout. No cortical encephalomalacia. Calvarium and skull base: Negative. Paranasal sinuses: Clear. Orbits: Visualized orbits and scalp soft tissues are within normal limits. CTA NECK Skeleton: Negative. Upper chest: Negative visible lungs. Normal visible superior mediastinum. Other neck: Negative.  No cervical lymphadenopathy. Aortic arch: 3 vessel arch configuration. No arch atherosclerosis. Normal great vessel origins. Right carotid system: Normal. Left carotid system: Normal. Vertebral arteries: Normal proximal right subclavian artery and right vertebral artery origin. Normal right vertebral artery to the skull base. Normal proximal left subclavian artery and left vertebral artery origin. The left vertebral artery is mildly dominant, and normal to the skull base. CTA HEAD Posterior circulation: Mildly dominant distal left vertebral artery. No distal vertebral artery abnormality. Normal right PICA origin. The left SCA appears to be dominant. Patent vertebrobasilar junction. Patent basilar artery without stenosis. Normal SCA and PCA origins. Posterior communicating arteries are diminutive or absent. Bilateral PCA branches appear normal. Anterior circulation: Patent ICA siphons with no atherosclerosis or  stenosis. Patent carotid termini. Normal MCA and ACA origins. Mildly tortuous A1 segments. Anterior communicating artery and bilateral ACA branches are within normal limits. Left MCA M1 segment, trifurcation, and left MCA branches are within normal limits. Right MCA M1 segment, bifurcation, and right MCA branches are within normal limits. Venous sinuses: Patent. Anatomic variants: Mildly dominant left vertebral artery. Delayed phase: No abnormal enhancement identified. Review of the MIP images confirms the above findings IMPRESSION: 1. Negative CTA Head and Neck, with no arterial abnormality identified. 2. Normal CT appearance of the brain, the multiple sclerosis associated white matter disease is occult by CT. Electronically Signed   By: Genevie Ann M.D.   On: 10/22/2017 07:01   Mr Brain W And Wo Contrast  Result Date: 10/22/2017 CLINICAL DATA:  Multiple sclerosis. Dizziness and near-syncope when turning head. New neurologic event. EXAM: MRI HEAD WITHOUT AND WITH CONTRAST MRI CERVICAL SPINE WITHOUT AND WITH CONTRAST TECHNIQUE: Multiplanar, multiecho pulse sequences of the brain and surrounding structures, and cervical spine, to include the craniocervical junction and cervicothoracic junction, were obtained without and with intravenous contrast. CONTRAST:  58mL MULTIHANCE GADOBENATE DIMEGLUMINE 529 MG/ML IV SOLN COMPARISON:  MRI of the brain and cervical spine 07/13/2017. FINDINGS: MRI HEAD FINDINGS Brain: Periventricular T2 signal changes are stable from the prior study. There is some involvement of the corpus callosum. No new lesions are present. There is no restricted diffusion or enhancement. No acute infarct, hemorrhage, or mass lesion is present. The ventricles are of normal size. No significant extra-axial fluid collection is present. Postcontrast images demonstrate no pathologic enhancement. Vascular: Flow is present in the major intracranial arteries. Skull and upper cervical spine: Skull base is normal.  Craniocervical junction is normal. Marrow signal is unremarkable. Sinuses/Orbits: The  paranasal sinuses and mastoid air cells are clear. Globes and orbits are within normal limits. MRI CERVICAL SPINE FINDINGS Alignment: AP alignment is anatomic. Vertebrae: Marrow signal and vertebral body heights are normal. Cord: New T2 signal changes are present in the left hemi cord at the C2 level. This extends 2 cm cephalo caudad is noted laterally and anteriorly. There is no associated enhancement. A smaller 11 mm cephalo caudad area of T2 signal changes present in the far right lateral hemi cord at the C2-3 level. No other focal cord signal changes are present to the lowest imaged level T2-3. Posterior Fossa, vertebral arteries, paraspinal tissues: The craniocervical junction is normal. Flow is present in the vertebral arteries bilaterally. Disc levels: A mild leftward disc osteophyte complex is again noted at C5-6. No significant stenosis is present. No other significant focal disc disease is present. IMPRESSION: 1. New T2 signal changes in the left hemicord at C2 extending 2 cm cephalo caudad. 2. Smaller new area of T2 signal change in the far right lateral hemi cord at C2-3 measuring 11 mm cephalo caudad. These lesions likely represent new areas of demyelination within the spinal cord. 3. Intracranial periventricular T2 signal changes are stable. 4. No acute intracranial abnormality. Electronically Signed   By: San Morelle M.D.   On: 10/22/2017 12:16   Mr Cervical Spine W Or Wo Contrast  Result Date: 10/22/2017 CLINICAL DATA:  Multiple sclerosis. Dizziness and near-syncope when turning head. New neurologic event. EXAM: MRI HEAD WITHOUT AND WITH CONTRAST MRI CERVICAL SPINE WITHOUT AND WITH CONTRAST TECHNIQUE: Multiplanar, multiecho pulse sequences of the brain and surrounding structures, and cervical spine, to include the craniocervical junction and cervicothoracic junction, were obtained without and with  intravenous contrast. CONTRAST:  21mL MULTIHANCE GADOBENATE DIMEGLUMINE 529 MG/ML IV SOLN COMPARISON:  MRI of the brain and cervical spine 07/13/2017. FINDINGS: MRI HEAD FINDINGS Brain: Periventricular T2 signal changes are stable from the prior study. There is some involvement of the corpus callosum. No new lesions are present. There is no restricted diffusion or enhancement. No acute infarct, hemorrhage, or mass lesion is present. The ventricles are of normal size. No significant extra-axial fluid collection is present. Postcontrast images demonstrate no pathologic enhancement. Vascular: Flow is present in the major intracranial arteries. Skull and upper cervical spine: Skull base is normal. Craniocervical junction is normal. Marrow signal is unremarkable. Sinuses/Orbits: The paranasal sinuses and mastoid air cells are clear. Globes and orbits are within normal limits. MRI CERVICAL SPINE FINDINGS Alignment: AP alignment is anatomic. Vertebrae: Marrow signal and vertebral body heights are normal. Cord: New T2 signal changes are present in the left hemi cord at the C2 level. This extends 2 cm cephalo caudad is noted laterally and anteriorly. There is no associated enhancement. A smaller 11 mm cephalo caudad area of T2 signal changes present in the far right lateral hemi cord at the C2-3 level. No other focal cord signal changes are present to the lowest imaged level T2-3. Posterior Fossa, vertebral arteries, paraspinal tissues: The craniocervical junction is normal. Flow is present in the vertebral arteries bilaterally. Disc levels: A mild leftward disc osteophyte complex is again noted at C5-6. No significant stenosis is present. No other significant focal disc disease is present. IMPRESSION: 1. New T2 signal changes in the left hemicord at C2 extending 2 cm cephalo caudad. 2. Smaller new area of T2 signal change in the far right lateral hemi cord at C2-3 measuring 11 mm cephalo caudad. These lesions likely  represent new areas  of demyelination within the spinal cord. 3. Intracranial periventricular T2 signal changes are stable. 4. No acute intracranial abnormality. Electronically Signed   By: San Morelle M.D.   On: 10/22/2017 12:16    Microbiology: No results found for this or any previous visit (from the past 240 hour(s)).   Labs: Basic Metabolic Panel: Recent Labs  Lab 10/22/17 0544 10/23/17 0839  NA 136 136  K 3.1* 4.0  CL 104 106  CO2 24 20*  GLUCOSE 99 185*  BUN 8 7  CREATININE 0.68 0.72  CALCIUM 8.7* 8.9   Liver Function Tests: No results for input(s): AST, ALT, ALKPHOS, BILITOT, PROT, ALBUMIN in the last 168 hours. No results for input(s): LIPASE, AMYLASE in the last 168 hours. No results for input(s): AMMONIA in the last 168 hours. CBC: Recent Labs  Lab 10/22/17 0544 10/23/17 0839  WBC 6.8 14.7*  NEUTROABS 3.4  --   HGB 12.9 13.5  HCT 37.0 39.5  MCV 93.0 94.3  PLT 174 226   Cardiac Enzymes: Recent Labs  Lab 10/22/17 0544  TROPONINI <0.03   BNP: BNP (last 3 results) No results for input(s): BNP in the last 8760 hours.  ProBNP (last 3 results) No results for input(s): PROBNP in the last 8760 hours.  CBG: Recent Labs  Lab 10/23/17 0606 10/24/17 0611  GLUCAP 176* 122*       Signed:  Velvet Bathe MD.  [facility] 10/24/2017, 2:01 PM

## 2017-10-24 NOTE — Care Management Note (Signed)
Case Management Note  Patient Details  Name: Karen Powers MRN: [id number] Date of Birth: [birth year]  Subjective/Objective:                    Action/Plan: Pt discharging home with self care. Pt has PCP, insurance and transportation home. No further needs per CM.   Expected Discharge Date:  10/24/17               Expected Discharge Plan:  Home/Self Care  In-House Referral:     Discharge planning Services     Post Acute Care Choice:    Choice offered to:     DME Arranged:    DME Agency:     HH Arranged:    HH Agency:     Status of Service:  Completed, signed off  If discussed at [employer] of Stay Meetings, dates discussed:    Additional Comments:  Pollie Friar, RN 10/24/2017, 3:53 PM

## 2017-10-24 NOTE — Progress Notes (Addendum)
Reason for consult: MS  Subjective:Still has subjective numbness and wakness in right leg   ROS: negative except above   Examination  Vital signs in last 24 hours: Temp:  [98.6 F (37 C)-99.1 F (37.3 C)] 99.1 F (37.3 C) (02/28 1203) Pulse Rate:  [72-91] 80 (02/28 1203) Resp:  [18-20] 18 (02/28 1203) BP: (95-116)/(45-77) 116/67 (02/28 1203) SpO2:  [95 %-99 %] 99 % (02/28 1203)  General: Not in distress, cooperative CVS: pulse-normal rate and rhythm RS: breathing comfortably Extremities: normal   Neuro: MS: Alert, oriented, follows commands CN: pupils equal and reactive,  EOMI, face symmetric, tongue midline, normal sensation over face, Motor: 5/5 strength in all 4 extremities.  Coordination: normal Gait: not tested  Basic Metabolic Panel: Recent Labs  Lab 10/22/17 0544 10/23/17 0839  NA 136 136  K 3.1* 4.0  CL 104 106  CO2 24 20*  GLUCOSE 99 185*  BUN 8 7  CREATININE 0.68 0.72  CALCIUM 8.7* 8.9    CBC: Recent Labs  Lab 10/22/17 0544 10/23/17 0839  WBC 6.8 14.7*  NEUTROABS 3.4  --   HGB 12.9 13.5  HCT 37.0 39.5  MCV 93.0 94.3  PLT 174 226     Coagulation Studies: No results for input(s): LABPROT, INR in the last 72 hours.  Imaging Reviewed:     ASSESSMENT AND PLAN  37 y.o. female who was diagnosed with MS back in 2009 not on Disease modifying therapy presents with burning sensation in right leg on turning her head. MRI C spine shows new enhancing lesion in her C spine at C2 level. MR Brain stable from prior studies.    Relapsing Remitting MS - now presenting with MS exacerbation  Completed 3 days of IV methylprednisone Called patient's neurologist Dr Donald Pore from [facility] to schedule follow up as outpatient to start DMD.     Karen Powers [facility] Pager Number [phone] For questions after 7pm please refer to AMION to reach the Neurologist on call

## 2017-10-25 ENCOUNTER — Telehealth: Payer: Self-pay | Admitting: *Deleted

## 2017-10-25 NOTE — Patient Outreach (Signed)
First outreach attempt for transition of care assessment. Karen Powers was hospitalized at [facility] on 2/26 and discharged on 2/28 for multiple sclerosis exacerbation. She was discharged home on oral Augmentin, Ibuprofen, and Zofran, in addition to her standing medications. Left message on Karen Powers's cell phone requesting return call. If she does not return call by end of business day today, another attempt will be made to reach her on Monday 10/28/17.  Barrington Ellison RN,CCM,CDE [place] Management Coordinator Office Phone [phone] Office Fax [phone]

## 2017-10-28 ENCOUNTER — Encounter: Payer: Self-pay | Admitting: *Deleted

## 2017-10-28 ENCOUNTER — Other Ambulatory Visit: Payer: Self-pay | Admitting: *Deleted

## 2017-10-28 NOTE — Patient Outreach (Signed)
[facility]) Care Management  10/28/2017  Karen Powers [birth year] [id number]   Subjective: Telephone call to patient's home  number, no answer, left HIPAA compliant voicemail message, and requested call back.     Objective: Per KPN (Knowledge [employer] Now, point of care tool) and chart review, patient hospitalized 10/22/17 -10/24/17  for Multiple sclerosis exacerbation, POTS (postural orthostatic tachycardia syndrome), gestational diabetes  and SVT (supraventricular tachycardia).      Assessment: Received [facility] Transition of care referral on 10/24/17.   Transition of care follow up pending patient contact.      Plan: RNCM will send unsuccessful outreach  letter, [facility] pamphlet, will call patient for 3rd telephone outreach attempt, transition of care follow up, and proceed with case closure, within 10 business days if no return call.      Jahree Dermody H. Annia Friendly, BSN, [place] Management [facility] Telephonic CM Phone: [phone] Fax: [phone]

## 2017-10-29 DIAGNOSIS — R42 Dizziness and giddiness: Secondary | ICD-10-CM | POA: Diagnosis not present

## 2017-10-29 DIAGNOSIS — G35 Multiple sclerosis: Secondary | ICD-10-CM | POA: Diagnosis not present

## 2017-11-08 ENCOUNTER — Encounter: Payer: Self-pay | Admitting: *Deleted

## 2017-11-08 ENCOUNTER — Other Ambulatory Visit: Payer: Self-pay | Admitting: *Deleted

## 2017-11-08 NOTE — Patient Outreach (Signed)
[facility]) Care Management  11/08/2017  Karen Powers [birth year] [id number]   Subjective: Telephone call to patient's home number, spoke with patient, and HIPAA verified.  Discussed [facility] Care Management UMR Transition of care follow up, patient voiced understanding, and is in agreement to follow up.  Patient states she is doing well, had follow up appointment with neurologist on 10/29/17, and appointment went well.   States the neurologist office is in the process of seeking approval for patient to receive Copaxone injections, patient will call the providers office today to obtain update, decline's RNCM assistance at this time, and will reach out to this RNCM if assistance needed in the future.   States she has received these injections in the past and will be able to self administer the medication. States she has not a Multiple sclerosis exacerbation in several years and has been very healthy. Patient states she is able to manage self care and has assistance as needed with activities of daily living / home management.  Patient voices understanding of medical diagnosis and treatment plan.   States she is accessing the following [facility] benefits: outpatient pharmacy, hospital indemnity (not a chosen benefit for this year, had benefit last year, will file claim if appropriate, verbally given contact number for [employer] [phone] to request itemized bill, verbally given contact for [facility] [phone]), and will call [employer] today to start family medical leave act (FMLA) process (verbally given contact number for [employer] [phone]).  Patient states she has a concerns with her recent hospitalization, is planning to follow up with the Office of Patient Experience, no assistance needed from this RNCM, and would like to follow up on her own.  Patient states she does not have any education material, transition of care, care coordination, disease  management, disease monitoring, transportation, community resource, or pharmacy needs at this time. States she is very appreciative of the follow up and is in agreement to receive [place] Management information.     Objective: Per KPN (Knowledge Performance Now, point of care tool) and chart review, patient hospitalized 10/22/17 -10/24/17  for Multiple sclerosis exacerbation, POTS (postural orthostatic tachycardia syndrome), gestational diabetes  and SVT (supraventricular tachycardia).      Assessment: Received UMR Transition of care referral on 10/24/17.   Transition of care follow up completed, no care management needs, and will proceed with case closure.      Plan: RNCM will send patient successful outreach letter, [facility] pamphlet, and magnet. RNCM will send case closure due to follow up completed / no care management needs request to [facility].      Kimesha Claxton H. Annia Friendly, BSN, [place] [facility] Telephonic CM Phone: [phone] Fax: [phone]

## 2017-11-14 ENCOUNTER — Telehealth: Payer: Self-pay | Admitting: Pharmacist

## 2017-11-14 NOTE — Telephone Encounter (Signed)
Called patient to schedule an appointment for the [employer] [facility]. I was unable to reach the patient so I left a HIPAA-compliant message requesting that the patient return my call.   

## 2017-11-20 ENCOUNTER — Ambulatory Visit (HOSPITAL_BASED_OUTPATIENT_CLINIC_OR_DEPARTMENT_OTHER): Payer: 59 | Admitting: Pharmacist

## 2017-11-20 DIAGNOSIS — Z79899 Other long term (current) drug therapy: Secondary | ICD-10-CM

## 2017-11-20 DIAGNOSIS — N3000 Acute cystitis without hematuria: Secondary | ICD-10-CM | POA: Diagnosis not present

## 2017-11-20 MED ORDER — GLATIRAMER ACETATE 40 MG/ML ~~LOC~~ SOSY: PREFILLED_SYRINGE | SUBCUTANEOUS | 12 refills | Status: DC

## 2017-11-20 NOTE — Progress Notes (Signed)
   S: Patient presents to [facility] for review of their specialty medication therapy.  Patient is currently taking Copaxone for multiple sclerosis. Patient is managed by Dr. Donnajean Lopes for this. She was on Copaxone about 6 years ago but stopped when she was pregnant and didn't restart it since she was doing well off of it. She had a relapse earlier this year and her doctor wanted her to restart the Copaxone.  Adherence: has not started but did not have any issues with adherence when she was last on it.   Efficacy: reports that it worked well when she was on it in the past  Dosing: 40 mg on MWF  Dose adjustments: Renal: no dose adjustments (has not been studied) Hepatic: no dose adjustments (has not been studied)  Monitoring: has not started.  She is considering having another child. She knows that she would stop the Copaxone during pregnancy but isn't sure what to do when she breastfeeds because she is afraid to be off of the medication and have another relapse but also don't want to hurt the baby and feels strongly that she would like to breastfeed since she breastfed her other children.   O:     Lab Results  Component Value Date   WBC 14.7 (H) 10/23/2017   HGB 13.5 10/23/2017   HCT 39.5 10/23/2017   MCV 94.3 10/23/2017   PLT 226 10/23/2017      Chemistry      Component Value Date/Time   NA 136 10/23/2017 0839   K 4.0 10/23/2017 0839   CL 106 10/23/2017 0839   CO2 20 (L) 10/23/2017 0839   BUN 7 10/23/2017 0839   CREATININE 0.72 10/23/2017 0839      Component Value Date/Time   CALCIUM 8.9 10/23/2017 0839   ALKPHOS 61 04/01/2017 0901   AST 14 (L) 04/01/2017 0901   ALT 14 04/01/2017 0901   BILITOT 2.2 (H) 04/01/2017 0901       A/P: 1. Medication review: Patient currently on Copaxone for multiple sclerosis. She has not restarted it yet but she was on it about 6 years ago and tolerated it well. Reviewed the medication with her, including the  following: possible adverse effects include chest pain, hypersensitivity reactions, immune response, lipoatrophy, and systemic reactions, such as anxiety, flushing, and tachycardia. Encouraged her to discuss with OB/pediatrician about breastfeeding on Copaxone if she does have another child. Per package insert, there have been no studies in breastfeeding women on Copaxone but it is generally avoided due to lack of data. No recommendations for any changes.   Christella Hartigan, PharmD, BCPS, BCACP, [place] [facility] [phone]

## 2017-11-21 MED FILL — GLATIRAMER ACETATE 40 MG/ML: 40 | 28 days supply | Qty: 12 | Fill #0

## 2017-12-14 DIAGNOSIS — N3001 Acute cystitis with hematuria: Secondary | ICD-10-CM | POA: Diagnosis not present

## 2017-12-31 MED FILL — GLATIRAMER ACETATE 40 MG/ML: 40 | 28 days supply | Qty: 12 | Fill #1

## 2018-01-17 ENCOUNTER — Encounter: Payer: Self-pay | Admitting: Family Medicine

## 2018-01-17 ENCOUNTER — Ambulatory Visit: Payer: 59 | Admitting: Family Medicine

## 2018-01-17 VITALS — BP 120/76 | HR 96 | Temp 99.3°F | Resp 16 | Ht 70.0 in | Wt 156.6 lb

## 2018-01-17 DIAGNOSIS — R300 Dysuria: Secondary | ICD-10-CM | POA: Diagnosis not present

## 2018-01-17 DIAGNOSIS — N390 Urinary tract infection, site not specified: Secondary | ICD-10-CM | POA: Diagnosis not present

## 2018-01-17 LAB — POC URINALSYSI DIPSTICK (AUTOMATED)
Bilirubin, UA: NEGATIVE
Blood, UA: NEGATIVE
Glucose, UA: NEGATIVE
Ketones, UA: NEGATIVE
Leukocytes, UA: NEGATIVE
Nitrite, UA: NEGATIVE
Protein, UA: NEGATIVE
Spec Grav, UA: 1.02 (ref 1.010–1.025)
Urobilinogen, UA: 1 E.U./dL
pH, UA: 6 (ref 5.0–8.0)

## 2018-01-17 MED ORDER — NITROFURANTOIN MONOHYD MACRO 100 MG PO CAPS: 100.0000 mg | ORAL_CAPSULE | Freq: Every day | ORAL | 1 refills | Status: DC

## 2018-01-17 MED ORDER — NITROFURANTOIN MONOHYD MACRO 100 MG PO CAPS: 100.0000 mg | ORAL_CAPSULE | Freq: Two times a day (BID) | ORAL | 0 refills | Status: DC

## 2018-01-17 MED ORDER — FLUCONAZOLE 150 MG PO TABS: ORAL_TABLET | ORAL | 3 refills | Status: DC

## 2018-01-17 MED FILL — FLUCONAZOLE 150 MG TABS: 150 | 2 days supply | Qty: 2 | Fill #0

## 2018-01-17 MED FILL — NITROFURANTOIN MONO-MCR 100: 100 | 7 days supply | Qty: 14 | Fill #0

## 2018-01-17 NOTE — Progress Notes (Signed)
Patient ID: Karen Powers, female   DOB: Jan 15, 1981, 37 y.o.   MRN: [id number]     Subjective:  I acted as a Education administrator for Dr. Carollee Herter.  Guerry Bruin, Fl[place]   Patient ID: Karen Powers, female    DOB: 30-Jul-1981, 37 y.o.   MRN: [id number]  Chief Complaint  Patient presents with  . Dysuria    HPI  Patient is in today for dysuria.  She states that this is the third one in 3 month period.  Has been taking Pyridium for pain.  Pt states the last several uti has been after she has intercourse each time.  Patient Care Team: Saguier, Iris Pert as PCP - General (Physician Assistant) Brien Few, MD (Obstetrics and Gynecology)   Past Medical History:  Diagnosis Date  . Acute blood loss anemia 01/06/2016  . Anemia   . Diabetes mellitus without complication ([place]) 4128   Gestational diabetes only.  . Gestational diabetes    2nd preg only  . Headache(784.0)    otc med prn  . Heartburn in pregnancy   . Maternal iron deficiency anemia 01/06/2016  . MS (multiple sclerosis) ([place])   . Postpartum care following cesarean delivery (5/11) 01/05/2016  . POTS (postural orthostatic tachycardia syndrome)   . SVT (supraventricular tachycardia) ([place])    No problems since 10/2012 - no med needed  . Vaginal Pap smear, abnormal   . Vitamin deficiency     Past Surgical History:  Procedure Laterality Date  . ATRIAL ABLATION SURGERY  2003   FAILED  . CESAREAN SECTION  2006   Breech  . CESAREAN SECTION  2006   breech  . CESAREAN SECTION N/A 12/19/2013   Procedure: REPEAT CESAREAN SECTION;  Surgeon: Lovenia Kim, MD;  Location: [facility];  Service: Obstetrics;  Laterality: N/A;  EDD: 12/25/13  . CESAREAN SECTION N/A 01/05/2016   Procedure: Repeat CESAREAN SECTION;  Surgeon: Brien Few, MD;  Location: [facility];  Service: Obstetrics;  Laterality: N/A;  EDD: 01/12/16   . COLPOSCOPY    . WISDOM TOOTH EXTRACTION      Family History  Problem Relation Age of Onset  . Multiple sclerosis  Mother   . Migraines Mother   . Emphysema Father   . Heart disease Father   . Migraines Father   . Migraines Sister   . Spina bifida Brother        half brother  . Cancer Paternal Grandfather        LUNG  . Diabetes Maternal Grandmother     Social History   Socioeconomic History  . Marital status: Married    Spouse name: Not on file  . Number of children: Not on file  . Years of education: Not on file  . Highest education level: Not on file  Occupational History  . Not on file  Social Needs  . Financial resource strain: Not on file  . Food insecurity:    Worry: Not on file    Inability: Not on file  . Transportation needs:    Medical: Not on file    Non-medical: Not on file  Tobacco Use  . Smoking status: Never Smoker  . Smokeless tobacco: Never Used  Substance and Sexual Activity  . Alcohol use: No  . Drug use: No  . Sexual activity: Yes    Birth control/protection: Pill    Comment: pregnant  Lifestyle  . Physical activity:    Days per week: Not on file    Minutes  per session: Not on file  . Stress: Not on file  Relationships  . Social connections:    Talks on phone: Not on file    Gets together: Not on file    Attends religious service: Not on file    Active member of club or organization: Not on file    Attends meetings of clubs or organizations: Not on file    Relationship status: Not on file  . Intimate partner violence:    Fear of current or ex partner: Not on file    Emotionally abused: Not on file    Physically abused: Not on file    Forced sexual activity: Not on file  Other Topics Concern  . Not on file  Social History Narrative   ** Merged History Encounter **        Outpatient Medications Prior to Visit  Medication Sig Dispense Refill  . acetaminophen (TYLENOL) 500 MG tablet Take 1,000 mg by mouth every 6 (six) hours as needed for headache.    . Cholecalciferol (VITAMIN D) 2000 units CAPS Take 2,000 Units by mouth daily.    . Glatiramer  Acetate 40 MG/ML SOSY Inject 1 ML (40 mg total) into the skin every Monday, Wednesday, and Friday. 12 Syringe 12  . ibuprofen (ADVIL,MOTRIN) 600 MG tablet Take 1 tablet (600 mg total) by mouth every 6 (six) hours as needed for mild pain. 30 tablet 0  . Multiple Vitamins-Minerals (WOMENS MULTIVITAMIN PLUS) TABS Take by mouth daily.    Marland Kitchen amoxicillin-clavulanate (AUGMENTIN) 875-125 MG tablet Take 1 tablet by mouth every 12 (twelve) hours. (Patient not taking: Reported on 10/22/2017) 14 tablet 0  . Norgestimate-Ethinyl Estradiol Triphasic (TRI-LO-SPRINTEC) 0.18/0.215/0.25 MG-25 MCG tab Take 1 tablet by mouth daily.    . ondansetron (ZOFRAN) 4 MG tablet Take 1 tablet (4 mg total) by mouth every 6 (six) hours. (Patient not taking: Reported on 10/22/2017) 12 tablet 0   No facility-administered medications prior to visit.     Allergies  Allergen Reactions  . Ciprofloxacin Rash    Has patient had a PCN reaction causing immediate rash, facial/tongue/throat swelling, SOB or lightheadedness with hypotension: No Has patient had a PCN reaction causing severe rash involving mucus membranes or skin necrosis: No Has patient had a PCN reaction that required hospitalization No Has patient had a PCN reaction occurring within the last 10 years: Yes If all of the above answers are "NO", then may proceed with Cephalosporin use.    Review of Systems  Constitutional: Negative for fever and malaise/fatigue.  HENT: Negative for congestion.   Eyes: Negative for blurred vision.  Respiratory: Negative for cough and shortness of breath.   Cardiovascular: Negative for chest pain, palpitations and leg swelling.  Gastrointestinal: Negative for vomiting.  Genitourinary: Positive for dysuria, flank pain, frequency and urgency. Negative for hematuria.  Musculoskeletal: Negative for back pain.  Skin: Negative for rash.  Neurological: Negative for loss of consciousness and headaches.       Objective:    Physical Exam    Constitutional: She is oriented to person, place, and time. She appears well-developed and well-nourished.  HENT:  Head: Normocephalic and atraumatic.  Eyes: Conjunctivae and EOM are normal.  Neck: Normal range of motion. Neck supple. No JVD present. Carotid bruit is not present. No thyromegaly present.  Cardiovascular: Normal rate, regular rhythm and normal heart sounds.  No murmur heard. Pulmonary/Chest: Effort normal and breath sounds normal. No respiratory distress. She has no wheezes. She has no rales. She  exhibits no tenderness.  Musculoskeletal: She exhibits no edema.  Neurological: She is alert and oriented to person, place, and time.  Psychiatric: She has a normal mood and affect.  Nursing note and vitals reviewed.   BP 120/76 (BP Location: Left Arm, Cuff Size: Normal)   Pulse 96   Temp 99.3 F (37.4 C) (Oral)   Resp 16   Ht 5\' 10"  (1.778 m)   Wt 156 lb 9.6 oz (71 kg)   LMP 01/01/2018   SpO2 98%   BMI 22.47 kg/m  Wt Readings from Last 3 Encounters:  01/17/18 156 lb 9.6 oz (71 kg)  10/22/17 145 lb (65.8 kg)  04/01/17 155 lb (70.3 kg)   BP Readings from Last 3 Encounters:  01/17/18 120/76  10/24/17 116/67  04/01/17 115/83     There is no immunization history for the selected administration types on file for this patient.  Health Maintenance  Topic Date Due  . PAP SMEAR  05/30/2002  . INFLUENZA VACCINE  03/27/2018  . TETANUS/TDAP  02/24/2026  . HIV Screening  Completed    Lab Results  Component Value Date   WBC 14.7 (H) 10/23/2017   HGB 13.5 10/23/2017   HCT 39.5 10/23/2017   PLT 226 10/23/2017   GLUCOSE 185 (H) 10/23/2017   CHOL 167 12/05/2016   TRIG 40.0 12/05/2016   HDL 60.00 12/05/2016   LDLCALC 99 12/05/2016   ALT 14 04/01/2017   AST 14 (L) 04/01/2017   NA 136 10/23/2017   K 4.0 10/23/2017   CL 106 10/23/2017   CREATININE 0.72 10/23/2017   BUN 7 10/23/2017   CO2 20 (L) 10/23/2017   TSH 1.24 12/05/2016    Lab Results  Component Value  Date   TSH 1.24 12/05/2016   Lab Results  Component Value Date   WBC 14.7 (H) 10/23/2017   HGB 13.5 10/23/2017   HCT 39.5 10/23/2017   MCV 94.3 10/23/2017   PLT 226 10/23/2017   Lab Results  Component Value Date   NA 136 10/23/2017   K 4.0 10/23/2017   CO2 20 (L) 10/23/2017   GLUCOSE 185 (H) 10/23/2017   BUN 7 10/23/2017   CREATININE 0.72 10/23/2017   BILITOT 2.2 (H) 04/01/2017   ALKPHOS 61 04/01/2017   AST 14 (L) 04/01/2017   ALT 14 04/01/2017   PROT 7.7 04/01/2017   ALBUMIN 4.7 04/01/2017   CALCIUM 8.9 10/23/2017   ANIONGAP 10 10/23/2017   GFR 100.91 12/05/2016   Lab Results  Component Value Date   CHOL 167 12/05/2016   Lab Results  Component Value Date   HDL 60.00 12/05/2016   Lab Results  Component Value Date   LDLCALC 99 12/05/2016   Lab Results  Component Value Date   TRIG 40.0 12/05/2016   Lab Results  Component Value Date   CHOLHDL 3 12/05/2016   No results found for: HGBA1C       Assessment & Plan:   Problem List Items Addressed This Visit    None    Visit Diagnoses    Dysuria    -  Primary   Relevant Medications   nitrofurantoin, macrocrystal-monohydrate, (MACROBID) 100 MG capsule   nitrofurantoin, macrocrystal-monohydrate, (MACROBID) 100 MG capsule   Other Relevant Orders   POCT Urinalysis Dipstick (Automated) (Completed)   Urine Culture   Postcoital UTI       Relevant Medications   nitrofurantoin, macrocrystal-monohydrate, (MACROBID) 100 MG capsule   nitrofurantoin, macrocrystal-monohydrate, (MACROBID) 100 MG capsule   fluconazole (DIFLUCAN)  150 MG tablet      I have discontinued Keylie C. Peckenpaugh's amoxicillin-clavulanate, ondansetron, and Norgestimate-Ethinyl Estradiol Triphasic. I am also having her start on nitrofurantoin (macrocrystal-monohydrate), nitrofurantoin (macrocrystal-monohydrate), and fluconazole. Additionally, I am having her maintain her acetaminophen, ibuprofen, WOMENS MULTIVITAMIN PLUS, Vitamin D, and Glatiramer  Acetate.  Meds ordered this encounter  Medications  . nitrofurantoin, macrocrystal-monohydrate, (MACROBID) 100 MG capsule    Sig: Take 1 capsule (100 mg total) by mouth 2 (two) times daily.    Dispense:  14 capsule    Refill:  0  . nitrofurantoin, macrocrystal-monohydrate, (MACROBID) 100 MG capsule    Sig: Take 1 capsule (100 mg total) by mouth at bedtime.    Dispense:  10 capsule    Refill:  1  . fluconazole (DIFLUCAN) 150 MG tablet    Sig: 1 po qd x 1 may repeat in 3 days prn    Dispense:  2 tablet    Refill:  3     CMA served as scribe during this visit. History, Physical and Plan performed by medical provider. Documentation and orders reviewed and attested to.  Ann Held, DO

## 2018-01-17 NOTE — Patient Instructions (Signed)

## 2018-01-18 LAB — URINE CULTURE
MICRO NUMBER:: 90633466
Result:: NO GROWTH
SPECIMEN QUALITY:: ADEQUATE

## 2018-01-29 DIAGNOSIS — G35 Multiple sclerosis: Secondary | ICD-10-CM | POA: Diagnosis not present

## 2018-01-30 DIAGNOSIS — Z3201 Encounter for pregnancy test, result positive: Secondary | ICD-10-CM | POA: Diagnosis not present

## 2018-03-05 DIAGNOSIS — O021 Missed abortion: Secondary | ICD-10-CM | POA: Diagnosis not present

## 2018-03-11 DIAGNOSIS — O021 Missed abortion: Secondary | ICD-10-CM | POA: Diagnosis not present

## 2018-03-11 DIAGNOSIS — O09299 Supervision of pregnancy with other poor reproductive or obstetric history, unspecified trimester: Secondary | ICD-10-CM | POA: Diagnosis not present

## 2018-03-11 MED FILL — NITROFURANTOIN MONO-MCR 100: 100 | 10 days supply | Qty: 10 | Fill #0

## 2018-03-11 MED FILL — miSOPROStol 200 MCG TABS: 200 | 1 days supply | Qty: 4 | Fill #0

## 2018-03-21 DIAGNOSIS — O039 Complete or unspecified spontaneous abortion without complication: Secondary | ICD-10-CM | POA: Diagnosis not present

## 2018-04-30 ENCOUNTER — Ambulatory Visit: Payer: 59 | Admitting: Medical

## 2018-04-30 ENCOUNTER — Encounter: Payer: Self-pay | Admitting: Medical

## 2018-04-30 VITALS — BP 108/78 | HR 103 | Temp 99.0°F | Ht 70.0 in | Wt 157.0 lb

## 2018-04-30 DIAGNOSIS — R059 Cough, unspecified: Secondary | ICD-10-CM

## 2018-04-30 DIAGNOSIS — J40 Bronchitis, not specified as acute or chronic: Secondary | ICD-10-CM | POA: Diagnosis not present

## 2018-04-30 DIAGNOSIS — R05 Cough: Secondary | ICD-10-CM | POA: Diagnosis not present

## 2018-04-30 DIAGNOSIS — J029 Acute pharyngitis, unspecified: Secondary | ICD-10-CM

## 2018-04-30 LAB — POCT RAPID STREP A (OFFICE): Rapid Strep A Screen: NEGATIVE

## 2018-04-30 MED ORDER — BENZONATATE 100 MG PO CAPS: 100.0000 mg | ORAL_CAPSULE | Freq: Three times a day (TID) | ORAL | 0 refills | Status: DC | PRN

## 2018-04-30 MED ORDER — AZITHROMYCIN 250 MG PO TABS: ORAL_TABLET | ORAL | 0 refills | Status: DC

## 2018-04-30 MED FILL — BENZONATATE 100 MG CAPSULE: 100 | 10 days supply | Qty: 30 | Fill #0

## 2018-04-30 MED FILL — AZITHROMYCIN 250 MG TABLET: 250 | 5 days supply | Qty: 6 | Fill #0

## 2018-04-30 NOTE — Patient Instructions (Addendum)
You appear to have bronchitis. Rest hydrate and tylenol for fever. I am prescribing cough medicine benzonatate, and azithromycin  antibiotic. For your nasal congestion can use flonase.  Rapid strep test was negative. In event false negative rapid strep azithromycin has good strep coverage.  You should gradually get better. If not then notify us and would recommend a chest xray.  Follow up in 7-10 days or as needed

## 2018-04-30 NOTE — Progress Notes (Signed)
Subjective:    Patient ID: Karen Powers, female    DOB: 1980-10-25, 37 y.o.   MRN: [id number]  HPI  Pt in for follow up.  She works in ED. She was recently exposed to pt who had pneumonia. She notes she thought patient had asthma.   Pt is beginning to get productive cough. Sick for about a week. Pt not getting better. No fever, no chills or sweats.   Pt states she is having some pain swallowing. But not reporting muscle aches, ha or severe fatigue. No neck stiffness. No recent know exposure to strep. However, pt is nurse in ED.  Pt has no wheezing.  LMP-August 15 th, 2019.     Review of Systems  Constitutional: Negative for chills, fatigue and fever.  HENT: Positive for sore throat. Negative for congestion, ear pain, rhinorrhea, sinus pressure and sinus pain.   Respiratory: Positive for cough. Negative for chest tightness, shortness of breath and wheezing.        Chest congestion.  Cardiovascular: Negative for chest pain and palpitations.  Gastrointestinal: Negative for abdominal pain, blood in stool, constipation and diarrhea.  Musculoskeletal: Negative for back pain.  Skin: Negative for pallor and rash.       One week faint rash back of legs. Used benadryl and almost better now. Mild itch.  Neurological: Negative for dizziness and headaches.  Hematological: Positive for adenopathy. Does not bruise/bleed easily.       Faint left submandibular node swollen.  Psychiatric/Behavioral: Negative for behavioral problems and confusion.    Past Medical History:  Diagnosis Date  . Acute blood loss anemia 01/06/2016  . Anemia   . Diabetes mellitus without complication ([place]) 7510   Gestational diabetes only.  . Gestational diabetes    2nd preg only  . Headache(784.0)    otc med prn  . Heartburn in pregnancy   . Maternal iron deficiency anemia 01/06/2016  . MS (multiple sclerosis) ([place])   . Postpartum care following cesarean delivery (5/11) 01/05/2016  . POTS (postural  orthostatic tachycardia syndrome)   . SVT (supraventricular tachycardia) ([place])    No problems since 10/2012 - no med needed  . Vaginal Pap smear, abnormal   . Vitamin deficiency      Social History   Socioeconomic History  . Marital status: Married    Spouse name: Not on file  . Number of children: Not on file  . Years of education: Not on file  . Highest education level: Not on file  Occupational History  . Not on file  Social Needs  . Financial resource strain: Not on file  . Food insecurity:    Worry: Not on file    Inability: Not on file  . Transportation needs:    Medical: Not on file    Non-medical: Not on file  Tobacco Use  . Smoking status: Never Smoker  . Smokeless tobacco: Never Used  Substance and Sexual Activity  . Alcohol use: No  . Drug use: No  . Sexual activity: Yes    Birth control/protection: Pill    Comment: pregnant  Lifestyle  . Physical activity:    Days per week: Not on file    Minutes per session: Not on file  . Stress: Not on file  Relationships  . Social connections:    Talks on phone: Not on file    Gets together: Not on file    Attends religious service: Not on file    Active member of club  or organization: Not on file    Attends meetings of clubs or organizations: Not on file    Relationship status: Not on file  . Intimate partner violence:    Fear of current or ex partner: Not on file    Emotionally abused: Not on file    Physically abused: Not on file    Forced sexual activity: Not on file  Other Topics Concern  . Not on file  Social History Narrative   ** Merged History Encounter **        Past Surgical History:  Procedure Laterality Date  . ATRIAL ABLATION SURGERY  2003   FAILED  . CESAREAN SECTION  2006   Breech  . CESAREAN SECTION  2006   breech  . CESAREAN SECTION N/A 12/19/2013   Procedure: REPEAT CESAREAN SECTION;  Surgeon: Lovenia Kim, MD;  Location: [facility];  Service: Obstetrics;  Laterality: N/A;  EDD:  12/25/13  . CESAREAN SECTION N/A 01/05/2016   Procedure: Repeat CESAREAN SECTION;  Surgeon: Brien Few, MD;  Location: [facility];  Service: Obstetrics;  Laterality: N/A;  EDD: 01/12/16   . COLPOSCOPY    . WISDOM TOOTH EXTRACTION      Family History  Problem Relation Age of Onset  . Multiple sclerosis Mother   . Migraines Mother   . Emphysema Father   . Heart disease Father   . Migraines Father   . Migraines Sister   . Spina bifida Brother        half brother  . Cancer Paternal Grandfather        LUNG  . Diabetes Maternal Grandmother     Allergies  Allergen Reactions  . Ciprofloxacin Rash    Has patient had a PCN reaction causing immediate rash, facial/tongue/throat swelling, SOB or lightheadedness with hypotension: No Has patient had a PCN reaction causing severe rash involving mucus membranes or skin necrosis: No Has patient had a PCN reaction that required hospitalization No Has patient had a PCN reaction occurring within the last 10 years: Yes If all of the above answers are "NO", then may proceed with Cephalosporin use.    Current Outpatient Medications on File Prior to Visit  Medication Sig Dispense Refill  . acetaminophen (TYLENOL) 500 MG tablet Take 1,000 mg by mouth every 6 (six) hours as needed for headache.    . Cholecalciferol (VITAMIN D) 2000 units CAPS Take 2,000 Units by mouth daily.    . fluconazole (DIFLUCAN) 150 MG tablet 1 po qd x 1 may repeat in 3 days prn 2 tablet 3  . Glatiramer Acetate 40 MG/ML SOSY Inject 1 ML (40 mg total) into the skin every Monday, Wednesday, and Friday. 12 Syringe 12  . ibuprofen (ADVIL,MOTRIN) 600 MG tablet Take 1 tablet (600 mg total) by mouth every 6 (six) hours as needed for mild pain. 30 tablet 0  . Multiple Vitamins-Minerals (WOMENS MULTIVITAMIN PLUS) TABS Take by mouth daily.     No current facility-administered medications on file prior to visit.     BP 108/78 (BP Location: Left Arm, Patient Position: Sitting,  Cuff Size: Normal)   Pulse (!) 103   Temp 99 F (37.2 C) (Oral)   Ht 5\' 10"  (1.778 m)   Wt 157 lb (71.2 kg)   SpO2 97%   BMI 22.53 kg/m       Objective:   Physical Exam  General  Mental Status - Alert. General Appearance - Well groomed. Not in acute distress.  Skin Rashes- No  Rashes.  HEENT Head- Normal. Ear Auditory Canal - Left- Normal. Right - Normal.Tympanic Membrane- Left- Normal. Right- Normal. Eye Sclera/Conjunctiva- Left- Normal. Right- Normal. Nose & Sinuses Nasal Mucosa- Left-  Boggy and Congested. Right-  Boggy and  Congested.Bilateral  No maxillary and no  frontal sinus pressure. Mouth & Throat Lips: Upper Lip- Normal: no dryness, cracking, pallor, cyanosis, or vesicular eruption. Lower Lip-Normal: no dryness, cracking, pallor, cyanosis or vesicular eruption. Buccal Mucosa- Bilateral- No Aphthous ulcers. Oropharynx- No Discharge or Erythema. Tonsils: Characteristics- Bilateral- Erythema. Size/Enlargement- Bilateral- No enlargement. Discharge- bilateral-None.  Neck Neck- Supple. No Masses. Mild left submandibular node swollen.   Chest and Lung Exam Auscultation: Breath Sounds:-Clear even and unlabored.  Cardiovascular Auscultation:Rythm- Regular, rate and rhythm. Murmurs & Other Heart Sounds:Ausculatation of the heart reveal- No Murmurs.  Lymphatic Head & Neck General Head & Neck Lymphatics: Bilateral: Description- No Localized lymphadenopathy.       Assessment & Plan:  You appear to have bronchitis. Rest hydrate and tylenol for fever. I am prescribing cough medicine benzonatate, and azithromycin  antibiotic. For your nasal congestion can use flonase.  Rapid strep test was negative. In event false negative rapid strep azithromycin has good strep coverage.  You should gradually get better. If not then notify us and would recommend a chest xray.  Follow up in 7-10 days or as needed  Regarding rash to legs barely visible now. If reflares or  persist then notify my by Friday or Monday. Might give 4-5 day low dose prednisone taper.  Mackie Pai, PA-C

## 2018-04-30 NOTE — Progress Notes (Signed)
Pre visit review using our clinic review tool, if applicable. No additional management support is needed unless otherwise documented below in the visit note. 

## 2018-05-02 ENCOUNTER — Telehealth: Payer: Self-pay | Admitting: Medical

## 2018-05-02 ENCOUNTER — Encounter: Payer: Self-pay | Admitting: Medical

## 2018-05-02 MED ORDER — PREDNISONE 10 MG (21) PO TBPK: ORAL_TABLET | ORAL | 0 refills | Status: DC

## 2018-05-02 NOTE — Telephone Encounter (Signed)
Called and asked our  pharmacy to cancel prednisone 6 day. Sent to other pharmacy.

## 2018-05-02 NOTE — Telephone Encounter (Signed)
Opened to send in rx prednisone to pharmacy [facility].

## 2018-06-24 DIAGNOSIS — D229 Melanocytic nevi, unspecified: Secondary | ICD-10-CM | POA: Diagnosis not present

## 2018-06-24 DIAGNOSIS — L509 Urticaria, unspecified: Secondary | ICD-10-CM | POA: Diagnosis not present

## 2018-06-25 DIAGNOSIS — Z3201 Encounter for pregnancy test, result positive: Secondary | ICD-10-CM | POA: Diagnosis not present

## 2018-06-25 DIAGNOSIS — Z3A01 Less than 8 weeks gestation of pregnancy: Secondary | ICD-10-CM | POA: Diagnosis not present

## 2018-06-25 DIAGNOSIS — O09291 Supervision of pregnancy with other poor reproductive or obstetric history, first trimester: Secondary | ICD-10-CM | POA: Diagnosis not present

## 2018-06-30 DIAGNOSIS — O09299 Supervision of pregnancy with other poor reproductive or obstetric history, unspecified trimester: Secondary | ICD-10-CM | POA: Diagnosis not present

## 2018-07-09 DIAGNOSIS — Z3201 Encounter for pregnancy test, result positive: Secondary | ICD-10-CM | POA: Diagnosis not present

## 2018-07-23 ENCOUNTER — Ambulatory Visit: Payer: Self-pay | Admitting: *Deleted

## 2018-07-23 ENCOUNTER — Ambulatory Visit: Payer: 59 | Admitting: Family Medicine

## 2018-07-23 DIAGNOSIS — R0602 Shortness of breath: Secondary | ICD-10-CM | POA: Diagnosis not present

## 2018-07-23 NOTE — Telephone Encounter (Signed)
Pt calling stating she has experienced shortness of breath for about a week. Pt states she is [redacted] weeks pregnant and has contacted OB regarding symptoms but is waiting for a return call. Pt states she experiences shortness of breath with sitting on the couch, talking and going up the steps. Pt denies any chest pain, runny nose fever but states that she does have some dizziness at times which is not uncommon for her during pregnancy, with her current pregnancy being the 4th one. PCP and other providers have no appt availability for the pt to be seen today in the [facility]. Pt scheduled today for appt at 2:15 pm with Dr. Zigmund Daniel.  Reason for Disposition . [1] MILD difficulty breathing (e.g., minimal/no SOB at rest, SOB with walking, pulse <100) AND [2] NEW-onset or WORSE than normal  Answer Assessment - Initial Assessment Questions 1. RESPIRATORY STATUS: "Describe your breathing?" (e.g., wheezing, shortness of breath, unable to speak, severe coughing)      Shortness of breath with sitting on the couch and with activity 2. ONSET: "When did this breathing problem begin?"      Pt states she has experienced shortness of breath for the past couple of days to a week  3. PATTERN "Does the difficult breathing come and go, or has it been constant since it started?"      Comes and goes, experiences with talking and doing activity such as going up the stairs 4. SEVERITY: "How bad is your breathing?" (e.g., mild, moderate, severe)    - MILD: No SOB at rest, mild SOB with walking, speaks normally in sentences, can lay down, no retractions, pulse < 100.    - MODERATE: SOB at rest, SOB with minimal exertion and prefers to sit, cannot lie down flat, speaks in phrases, mild retractions, audible wheezing, pulse 100-120.    - SEVERE: Very SOB at rest, speaks in single words, struggling to breathe, sitting hunched forward, retractions, pulse > 120    Pt states she feels like she has  activity intolerance but gets  more winded has shortness of breath even with sitting on the couch 5. RECURRENT SYMPTOM: "Have you had difficulty breathing before?" If so, ask: "When was the last time?" and "What happened that time?"      No 6. CARDIAC HISTORY: "Do you have any history of heart disease?" (e.g., heart attack, angina, bypass surgery, angioplasty)     Hx of  SVT; Pt states her HR has been in  110s at the most nothing like before. Pt does not feel like HR is elevated at this time 7. LUNG HISTORY: "Do you have any history of lung disease?"  (e.g., pulmonary embolus, asthma, emphysema)     No 8. CAUSE: "What do you think is causing the breathing problem?"      Unsure if it is due to being [redacted] weeks pregnant 9. OTHER SYMPTOMS: "Do you have any other symptoms? (e.g., dizziness, runny nose, cough, chest pain, fever)     Dizziness intermittently but pt states she experiences dizziness with pregnancy. Pt states that this is her 4th pregnancy 10. PREGNANCY: "Is there any chance you are pregnant?" "When was your last menstrual period?"       Yes [redacted] weeks pregnant 11. TRAVEL: "Have you traveled out of the country in the last month?" (e.g., travel history, exposures)       no  Protocols used: BREATHING DIFFICULTY-A-AH

## 2018-08-01 DIAGNOSIS — O09521 Supervision of elderly multigravida, first trimester: Secondary | ICD-10-CM | POA: Diagnosis not present

## 2018-08-01 DIAGNOSIS — Z1329 Encounter for screening for other suspected endocrine disorder: Secondary | ICD-10-CM | POA: Diagnosis not present

## 2018-08-01 DIAGNOSIS — Z3689 Encounter for other specified antenatal screening: Secondary | ICD-10-CM | POA: Diagnosis not present

## 2018-08-01 LAB — OB RESULTS CONSOLE ABO/RH: RH Type: NEGATIVE

## 2018-08-01 LAB — OB RESULTS CONSOLE RPR: RPR: NONREACTIVE

## 2018-08-01 LAB — OB RESULTS CONSOLE HIV ANTIBODY (ROUTINE TESTING): HIV: NONREACTIVE

## 2018-08-01 LAB — OB RESULTS CONSOLE RUBELLA ANTIBODY, IGM: Rubella: IMMUNE

## 2018-08-01 LAB — OB RESULTS CONSOLE ANTIBODY SCREEN: Antibody Screen: NEGATIVE

## 2018-08-01 LAB — OB RESULTS CONSOLE HEPATITIS B SURFACE ANTIGEN: Hepatitis B Surface Ag: NEGATIVE

## 2018-08-08 DIAGNOSIS — O09521 Supervision of elderly multigravida, first trimester: Secondary | ICD-10-CM | POA: Diagnosis not present

## 2018-08-08 DIAGNOSIS — Z3689 Encounter for other specified antenatal screening: Secondary | ICD-10-CM | POA: Diagnosis not present

## 2018-11-17 MED FILL — NITROFURANTOIN MONO-MCR 100: 100 | 10 days supply | Qty: 10 | Fill #1

## 2019-02-10 ENCOUNTER — Other Ambulatory Visit: Payer: Self-pay | Admitting: Obstetrics and Gynecology

## 2019-02-12 ENCOUNTER — Encounter (HOSPITAL_COMMUNITY): Payer: Self-pay | Admitting: *Deleted

## 2019-02-12 NOTE — Patient Instructions (Signed)
Karen Powers  02/12/2019   Your procedure is scheduled on:  02/25/2019  Arrive at 1000 at Entrance C on [address] at [facility]  and [address]. You are invited to use the FREE valet parking or use the Visitor's parking deck.  Pick up the phone at the desk and dial [phone].  Call this number if you have problems the morning of surgery: [phone]  Remember:   Do not eat food:(After Midnight) Desps de medianoche.  Do not drink clear liquids: (After Midnight) Desps de medianoche.  Take these medicines the morning of surgery with A SIP OF WATER:  none   Do not wear jewelry, make-up or nail polish.  Do not wear lotions, powders, or perfumes. Do not wear deodorant.  Do not shave 48 hours prior to surgery.  Do not bring valuables to the hospital.  [facility] is not   responsible for any belongings or valuables brought to the hospital.  Contacts, dentures or bridgework may not be worn into surgery.  Leave suitcase in the car. After surgery it may be brought to your room.  For patients admitted to the hospital, checkout time is 11:00 AM the day of              discharge.      Please read over the following fact sheets that you were given:     Preparing for Surgery

## 2019-02-21 ENCOUNTER — Inpatient Hospital Stay (HOSPITAL_COMMUNITY)
Admission: AD | Admit: 2019-02-21 | Discharge: 2019-02-21 | Disposition: A | Discharge status: Home / Self Care | Payer: No Typology Code available for payment source | Attending: Obstetrics and Gynecology | Admitting: Obstetrics and Gynecology

## 2019-02-21 ENCOUNTER — Other Ambulatory Visit: Payer: Self-pay

## 2019-02-21 ENCOUNTER — Encounter (HOSPITAL_COMMUNITY): Payer: Self-pay

## 2019-02-21 DIAGNOSIS — O471 False labor at or after 37 completed weeks of gestation: Secondary | ICD-10-CM

## 2019-02-21 DIAGNOSIS — R030 Elevated blood-pressure reading, without diagnosis of hypertension: Secondary | ICD-10-CM | POA: Diagnosis not present

## 2019-02-21 DIAGNOSIS — Z3A38 38 weeks gestation of pregnancy: Secondary | ICD-10-CM | POA: Insufficient documentation

## 2019-02-21 DIAGNOSIS — O479 False labor, unspecified: Secondary | ICD-10-CM

## 2019-02-21 DIAGNOSIS — O26893 Other specified pregnancy related conditions, third trimester: Secondary | ICD-10-CM | POA: Insufficient documentation

## 2019-02-21 LAB — CBC
HCT: 32.2 % — ABNORMAL LOW (ref 36.0–46.0)
Hemoglobin: 10.6 g/dL — ABNORMAL LOW (ref 12.0–15.0)
MCH: 30.8 pg (ref 26.0–34.0)
MCHC: 32.9 g/dL (ref 30.0–36.0)
MCV: 93.6 fL (ref 80.0–100.0)
Platelets: 195 10*3/uL (ref 150–400)
RBC: 3.44 MIL/uL — ABNORMAL LOW (ref 3.87–5.11)
RDW: 12.8 % (ref 11.5–15.5)
WBC: 11.3 10*3/uL — ABNORMAL HIGH (ref 4.0–10.5)
nRBC: 0 % (ref 0.0–0.2)

## 2019-02-21 LAB — COMPREHENSIVE METABOLIC PANEL
ALT: 12 U/L (ref 0–44)
AST: 19 U/L (ref 15–41)
Albumin: 2.9 g/dL — ABNORMAL LOW (ref 3.5–5.0)
Alkaline Phosphatase: 122 U/L (ref 38–126)
Anion gap: 12 (ref 5–15)
BUN: 8 mg/dL (ref 6–20)
CO2: 20 mmol/L — ABNORMAL LOW (ref 22–32)
Calcium: 8.6 mg/dL — ABNORMAL LOW (ref 8.9–10.3)
Chloride: 103 mmol/L (ref 98–111)
Creatinine, Ser: 0.55 mg/dL (ref 0.44–1.00)
GFR calc Af Amer: >60 mL/min (ref >60)
GFR calc non Af Amer: >60 mL/min (ref >60)
Glucose, Bld: 101 mg/dL — ABNORMAL HIGH (ref 70–99)
Potassium: 3.9 mmol/L (ref 3.5–5.1)
Sodium: 135 mmol/L (ref 135–145)
Total Bilirubin: 0.5 mg/dL (ref 0.3–1.2)
Total Protein: 5.8 g/dL — ABNORMAL LOW (ref 6.5–8.1)

## 2019-02-21 LAB — PROTEIN / CREATININE RATIO, URINE
Creatinine, Urine: 53.94 mg/dL
Total Protein, Urine: <6 mg/dL

## 2019-02-21 MED ORDER — BUTORPHANOL TARTRATE 1 MG/ML IJ SOLN
1.0000 mg | Freq: Once | INTRAMUSCULAR | Status: AC
  Administered 2019-02-21: 1 mg via INTRAMUSCULAR
  Filled 2019-02-21: qty 1

## 2019-02-21 NOTE — MAU Provider Note (Signed)
None     S: [name] is a 38 y.o. [id number] at 38.3 weeks who presents to MAU today with complaint of contractions.   During her evaluation, patient with elevated blood pressures.  Primary provider, Dr. Gareth Eagle, gave orders to nurse for management.    O: BP (!) 141/91   Pulse 92   Temp 98.8 F (37.1 C) (Oral)   Resp 18   Ht 5\' 10"  (1.778 m)   Wt 87.7 kg   SpO2 99%   BMI 27.74 kg/m  Physical Exam  Constitutional: She is oriented to person, place, and time. She appears well-developed and well-nourished. No distress.  HENT:  Head: Normocephalic and atraumatic.  Eyes: Conjunctivae are normal.  Neck: Normal range of motion.  Cardiovascular: Normal rate, regular rhythm and normal heart sounds.  Respiratory: Effort normal and breath sounds normal.  GI: Soft.  Musculoskeletal: Normal range of motion.        General: No edema.  Neurological: She is alert and oriented to person, place, and time.  Skin: Skin is warm and dry.  Psychiatric: She has a normal mood and affect. Her behavior is normal.   Fetal Assessment 135 bpm, Mod Var, -Decels, +Accels Contractions Q 5-45min  A: 38.3 weeks [id number] Medical screening exam complete Elevated BP Cat I FT  P: Patient without questions or concerns. NST Reactive Orders and management per Dr. Nolon Bussing, [place], [place] 02/21/2019 6:52 AM

## 2019-02-21 NOTE — MAU Note (Signed)
Pt reports contractions since 10pm, every 6 mins apart. Denies LOF or vaginal bleeding. Reports good fetal movement. Cervix closed on Tuesday.

## 2019-02-21 NOTE — Progress Notes (Signed)
Dr Murrell Redden notified of pt's labs, VE, FHR pattern and blood pressures. Orders received to discharge home

## 2019-02-23 ENCOUNTER — Other Ambulatory Visit (HOSPITAL_COMMUNITY)
Admission: RE | Admit: 2019-02-23 | Discharge: 2019-02-23 | Disposition: A | Discharge status: Home / Self Care | Payer: No Typology Code available for payment source | Source: Ambulatory Visit | Attending: Obstetrics and Gynecology | Admitting: Obstetrics and Gynecology

## 2019-02-23 ENCOUNTER — Other Ambulatory Visit: Payer: Self-pay

## 2019-02-23 DIAGNOSIS — Z1159 Encounter for screening for other viral diseases: Secondary | ICD-10-CM | POA: Diagnosis not present

## 2019-02-23 DIAGNOSIS — Z01812 Encounter for preprocedural laboratory examination: Secondary | ICD-10-CM | POA: Diagnosis not present

## 2019-02-23 LAB — SARS CORONAVIRUS 2 (TAT 6-24 HRS): SARS Coronavirus 2: NEGATIVE

## 2019-02-23 NOTE — MAU Note (Signed)
Covid swab collected.Pt asymptomatic. PT tolerated well

## 2019-02-24 NOTE — H&P (Signed)
Karen Powers is a 38 y.o. female presenting for rpt csection and TL. OB History    Gravida  6   Para  3   Term  3   Preterm      AB  2   Living  3     SAB  2   TAB      Ectopic      Multiple  0   Live Births  3          Past Medical History:  Diagnosis Date  . Acute blood loss anemia 01/06/2016  . Anemia   . Diabetes mellitus without complication ([place]) 2423   Gestational diabetes only.  Marland Kitchen Dysrhythmia   . Gestational diabetes    2nd preg only  . Headache(784.0)    otc med prn  . Heartburn in pregnancy   . Maternal iron deficiency anemia 01/06/2016  . MS (multiple sclerosis) ([place])   . Postpartum care following cesarean delivery (5/11) 01/05/2016  . POTS (postural orthostatic tachycardia syndrome)   . SVT (supraventricular tachycardia) ([place])    No problems since 10/2012 - no med needed  . Vaginal Pap smear, abnormal   . Vitamin deficiency    Past Surgical History:  Procedure Laterality Date  . ATRIAL ABLATION SURGERY  2003   FAILED  . CESAREAN SECTION  2006   Breech  . CESAREAN SECTION  2006   breech  . CESAREAN SECTION N/A 12/19/2013   Procedure: REPEAT CESAREAN SECTION;  Surgeon: Karen Kim, MD;  Location: [facility];  Service: Obstetrics;  Laterality: N/A;  EDD: 12/25/13  . CESAREAN SECTION N/A 01/05/2016   Procedure: Repeat CESAREAN SECTION;  Surgeon: Karen Few, MD;  Location: [facility];  Service: Obstetrics;  Laterality: N/A;  EDD: 01/12/16   . COLPOSCOPY    . WISDOM TOOTH EXTRACTION     Family History: family history includes Cancer in her paternal grandfather; Diabetes in her maternal grandmother; Emphysema in her father; Heart disease in her father; Migraines in her father, mother, and sister; Multiple sclerosis in her mother; Spina bifida in her brother. Social History:  reports that she has never smoked. She has never used smokeless tobacco. She reports that she does not drink alcohol or use drugs.     Maternal Diabetes:  No Genetic Screening: Normal Maternal Ultrasounds/Referrals: Normal Fetal Ultrasounds or other Referrals:  None Maternal Substance Abuse:  No Significant Maternal Medications:  None Significant Maternal Lab Results:  None Other Comments:  None  Review of Systems  Constitutional: Negative.   All other systems reviewed and are negative.  Maternal Medical History:  Fetal activity: Perceived fetal activity is normal.   Last perceived fetal movement was within the past hour.    Prenatal complications: PIH.   Prenatal Complications - Diabetes: none.      unknown if currently breastfeeding. Maternal Exam:  Uterine Assessment: Contraction strength is mild.  Contraction frequency is rare.   Abdomen: Patient reports no abdominal tenderness. Surgical scars: low transverse.   Fetal presentation: vertex  Introitus: Normal vulva. Normal vagina.  Ferning test: not done.  Nitrazine test: not done. Amniotic fluid character: not assessed.  Pelvis: questionable for delivery.   Cervix: Cervix evaluated by digital exam.     Physical Exam  Nursing note and vitals reviewed. Constitutional: She is oriented to person, place, and time. She appears well-developed and well-nourished.  HENT:  Head: Normocephalic and atraumatic.  Neck: Normal range of motion. Neck supple.  Cardiovascular: Normal rate and  regular rhythm.  Respiratory: Effort normal and breath sounds normal.  GI: Soft. Bowel sounds are normal.  Genitourinary:    Vulva, vagina and uterus normal.   Musculoskeletal: Normal range of motion.  Neurological: She is alert and oriented to person, place, and time. She has normal reflexes.  Skin: Skin is warm and dry.  Psychiatric: She has a normal mood and affect.    Prenatal labs: ABO, Rh: O/Negative/-- (12/06 0000) Antibody: Negative (12/06 0000) Rubella: Immune (12/06 0000) RPR: Nonreactive (12/06 0000)  HBsAg: Negative (12/06 0000)  HIV: Non-reactive (12/06 0000)  GBS:      Assessment/Plan: 39wk IUP Previous csection x 3 Rpt csection and TL IV TXA Surgical consent done.   Karen Powers J 02/24/2019, 9:43 PM

## 2019-02-25 ENCOUNTER — Inpatient Hospital Stay (HOSPITAL_COMMUNITY): Payer: No Typology Code available for payment source | Admitting: Anesthesiology

## 2019-02-25 ENCOUNTER — Other Ambulatory Visit: Payer: Self-pay

## 2019-02-25 ENCOUNTER — Inpatient Hospital Stay (HOSPITAL_COMMUNITY)
Admission: RE | Admit: 2019-02-25 | Discharge: 2019-02-27 | DRG: 784 | Disposition: A | Discharge status: Home / Self Care | Payer: No Typology Code available for payment source | Attending: Obstetrics and Gynecology | Admitting: Obstetrics and Gynecology

## 2019-02-25 ENCOUNTER — Encounter (HOSPITAL_COMMUNITY)
Admission: RE | Disposition: A | Discharge status: Home / Self Care | Payer: Self-pay | Source: Home / Self Care | Attending: Obstetrics and Gynecology

## 2019-02-25 ENCOUNTER — Encounter (HOSPITAL_COMMUNITY): Payer: Self-pay | Admitting: *Deleted

## 2019-02-25 DIAGNOSIS — O34219 Maternal care for unspecified type scar from previous cesarean delivery: Secondary | ICD-10-CM | POA: Diagnosis present

## 2019-02-25 DIAGNOSIS — O34211 Maternal care for low transverse scar from previous cesarean delivery: Secondary | ICD-10-CM | POA: Diagnosis present

## 2019-02-25 DIAGNOSIS — O26893 Other specified pregnancy related conditions, third trimester: Secondary | ICD-10-CM | POA: Diagnosis present

## 2019-02-25 DIAGNOSIS — D649 Anemia, unspecified: Secondary | ICD-10-CM | POA: Diagnosis present

## 2019-02-25 DIAGNOSIS — Z302 Encounter for sterilization: Secondary | ICD-10-CM

## 2019-02-25 DIAGNOSIS — O9902 Anemia complicating childbirth: Secondary | ICD-10-CM | POA: Diagnosis present

## 2019-02-25 DIAGNOSIS — Z6791 Unspecified blood type, Rh negative: Secondary | ICD-10-CM

## 2019-02-25 DIAGNOSIS — Z3A39 39 weeks gestation of pregnancy: Secondary | ICD-10-CM

## 2019-02-25 LAB — CBC
HCT: 34.4 % — ABNORMAL LOW (ref 36.0–46.0)
Hemoglobin: 11.4 g/dL — ABNORMAL LOW (ref 12.0–15.0)
MCH: 31.3 pg (ref 26.0–34.0)
MCHC: 33.1 g/dL (ref 30.0–36.0)
MCV: 94.5 fL (ref 80.0–100.0)
Platelets: 202 10*3/uL (ref 150–400)
RBC: 3.64 MIL/uL — ABNORMAL LOW (ref 3.87–5.11)
RDW: 12.9 % (ref 11.5–15.5)
WBC: 11.6 10*3/uL — ABNORMAL HIGH (ref 4.0–10.5)
nRBC: 0 % (ref 0.0–0.2)

## 2019-02-25 SURGERY — Surgical Case
Anesthesia: Spinal | Site: Abdomen | Laterality: Bilateral | Wound class: Clean Contaminated

## 2019-02-25 MED ORDER — DIPHENHYDRAMINE HCL 25 MG PO CAPS: 25.0000 mg | ORAL_CAPSULE | Freq: Four times a day (QID) | ORAL | Status: DC | PRN

## 2019-02-25 MED ORDER — NALBUPHINE HCL 10 MG/ML IJ SOLN: 5.0000 mg | Freq: Once | INTRAMUSCULAR | Status: DC | PRN

## 2019-02-25 MED ORDER — SCOPOLAMINE 1 MG/3DAYS TD PT72
MEDICATED_PATCH | TRANSDERMAL | Status: AC
  Filled 2019-02-25: qty 1

## 2019-02-25 MED ORDER — SODIUM CHLORIDE 0.9 % IV SOLN
INTRAVENOUS | Status: DC | PRN
  Administered 2019-02-25: 40 [IU] via INTRAVENOUS

## 2019-02-25 MED ORDER — KETOROLAC TROMETHAMINE 30 MG/ML IJ SOLN: 30.0000 mg | Freq: Four times a day (QID) | INTRAMUSCULAR | Status: AC | PRN

## 2019-02-25 MED ORDER — SODIUM CHLORIDE 0.9 % IR SOLN
Status: DC | PRN
  Administered 2019-02-25: 100 mL

## 2019-02-25 MED ORDER — MORPHINE SULFATE (PF) 0.5 MG/ML IJ SOLN
INTRAMUSCULAR | Status: DC | PRN
  Administered 2019-02-25: .15 mg via INTRATHECAL

## 2019-02-25 MED ORDER — ONDANSETRON HCL 4 MG/2ML IJ SOLN
INTRAMUSCULAR | Status: AC
  Filled 2019-02-25: qty 2

## 2019-02-25 MED ORDER — NALBUPHINE HCL 10 MG/ML IJ SOLN
5.0000 mg | Freq: Once | INTRAMUSCULAR | Status: DC | PRN
  Filled 2019-02-25: qty 1

## 2019-02-25 MED ORDER — LACTATED RINGERS IV SOLN
INTRAVENOUS | Status: DC
  Administered 2019-02-25 (×2): via INTRAVENOUS

## 2019-02-25 MED ORDER — KETOROLAC TROMETHAMINE 30 MG/ML IJ SOLN
INTRAMUSCULAR | Status: AC
  Filled 2019-02-25: qty 1

## 2019-02-25 MED ORDER — ZOLPIDEM TARTRATE 5 MG PO TABS: 5.0000 mg | ORAL_TABLET | Freq: Every evening | ORAL | Status: DC | PRN

## 2019-02-25 MED ORDER — FENTANYL CITRATE (PF) 100 MCG/2ML IJ SOLN: 25.0000 ug | INTRAMUSCULAR | Status: DC | PRN

## 2019-02-25 MED ORDER — KETOROLAC TROMETHAMINE 30 MG/ML IJ SOLN
30.0000 mg | Freq: Four times a day (QID) | INTRAMUSCULAR | Status: AC | PRN
  Administered 2019-02-25: 30 mg via INTRAMUSCULAR

## 2019-02-25 MED ORDER — SIMETHICONE 80 MG PO CHEW: 80.0000 mg | CHEWABLE_TABLET | ORAL | Status: DC | PRN

## 2019-02-25 MED ORDER — LACTATED RINGERS IV SOLN
INTRAVENOUS | Status: DC
  Administered 2019-02-25 – 2019-02-26 (×2): via INTRAVENOUS

## 2019-02-25 MED ORDER — FENTANYL CITRATE (PF) 100 MCG/2ML IJ SOLN
INTRAMUSCULAR | Status: AC
  Filled 2019-02-25: qty 2

## 2019-02-25 MED ORDER — NALOXONE HCL 0.4 MG/ML IJ SOLN: 0.4000 mg | INTRAMUSCULAR | Status: DC | PRN

## 2019-02-25 MED ORDER — OXYCODONE-ACETAMINOPHEN 5-325 MG PO TABS
1.0000 | ORAL_TABLET | ORAL | Status: DC | PRN
  Administered 2019-02-27 (×2): 1 via ORAL
  Filled 2019-02-25: qty 2
  Filled 2019-02-25: qty 1

## 2019-02-25 MED ORDER — METHYLERGONOVINE MALEATE 0.2 MG/ML IJ SOLN: 0.2000 mg | INTRAMUSCULAR | Status: DC | PRN

## 2019-02-25 MED ORDER — SENNOSIDES-DOCUSATE SODIUM 8.6-50 MG PO TABS
2.0000 | ORAL_TABLET | ORAL | Status: DC
  Administered 2019-02-25 – 2019-02-26 (×2): 2 via ORAL
  Filled 2019-02-25 (×2): qty 2

## 2019-02-25 MED ORDER — BUPIVACAINE HCL (PF) 0.25 % IJ SOLN
INTRAMUSCULAR | Status: DC | PRN
  Administered 2019-02-25: 20 mL

## 2019-02-25 MED ORDER — BUPIVACAINE IN DEXTROSE 0.75-8.25 % IT SOLN
INTRATHECAL | Status: DC | PRN
  Administered 2019-02-25: 2 mL via INTRATHECAL

## 2019-02-25 MED ORDER — SODIUM CHLORIDE 0.9 % IV SOLN
INTRAVENOUS | Status: DC | PRN
  Administered 2019-02-25: 60 ug/min via INTRAVENOUS

## 2019-02-25 MED ORDER — SIMETHICONE 80 MG PO CHEW
80.0000 mg | CHEWABLE_TABLET | Freq: Three times a day (TID) | ORAL | Status: DC
  Administered 2019-02-25 – 2019-02-27 (×5): 80 mg via ORAL
  Filled 2019-02-25 (×5): qty 1

## 2019-02-25 MED ORDER — OXYTOCIN 40 UNITS IN NORMAL SALINE INFUSION - SIMPLE MED
INTRAVENOUS | Status: AC
  Filled 2019-02-25: qty 1000

## 2019-02-25 MED ORDER — OXYTOCIN 40 UNITS IN NORMAL SALINE INFUSION - SIMPLE MED: 2.5000 [IU]/h | INTRAVENOUS | Status: AC

## 2019-02-25 MED ORDER — CEFAZOLIN SODIUM-DEXTROSE 2-4 GM/100ML-% IV SOLN
2.0000 g | INTRAVENOUS | Status: AC
  Administered 2019-02-25: 2 g via INTRAVENOUS

## 2019-02-25 MED ORDER — MEPERIDINE HCL 25 MG/ML IJ SOLN: 6.2500 mg | INTRAMUSCULAR | Status: DC | PRN

## 2019-02-25 MED ORDER — FENTANYL CITRATE (PF) 100 MCG/2ML IJ SOLN
INTRAMUSCULAR | Status: DC | PRN
  Administered 2019-02-25: 15 ug via INTRATHECAL

## 2019-02-25 MED ORDER — SOD CITRATE-CITRIC ACID 500-334 MG/5ML PO SOLN
30.0000 mL | Freq: Once | ORAL | Status: AC
  Administered 2019-02-25: 30 mL via ORAL

## 2019-02-25 MED ORDER — CEFAZOLIN SODIUM-DEXTROSE 2-4 GM/100ML-% IV SOLN
INTRAVENOUS | Status: AC
  Filled 2019-02-25: qty 100

## 2019-02-25 MED ORDER — WITCH HAZEL-GLYCERIN EX PADS: 1.0000 "application " | MEDICATED_PAD | CUTANEOUS | Status: DC | PRN

## 2019-02-25 MED ORDER — KETOROLAC TROMETHAMINE 30 MG/ML IJ SOLN
30.0000 mg | Freq: Four times a day (QID) | INTRAMUSCULAR | Status: AC
  Administered 2019-02-25 – 2019-02-26 (×3): 30 mg via INTRAVENOUS
  Filled 2019-02-25 (×3): qty 1

## 2019-02-25 MED ORDER — NALOXONE HCL 4 MG/10ML IJ SOLN
1.0000 ug/kg/h | INTRAVENOUS | Status: DC | PRN
  Filled 2019-02-25: qty 5

## 2019-02-25 MED ORDER — PRENATAL MULTIVITAMIN CH
1.0000 | ORAL_TABLET | Freq: Every day | ORAL | Status: DC
  Administered 2019-02-26 – 2019-02-27 (×2): 1 via ORAL
  Filled 2019-02-25 (×2): qty 1

## 2019-02-25 MED ORDER — DIPHENHYDRAMINE HCL 25 MG PO CAPS: 25.0000 mg | ORAL_CAPSULE | ORAL | Status: DC | PRN

## 2019-02-25 MED ORDER — MENTHOL 3 MG MT LOZG: 1.0000 | LOZENGE | OROMUCOSAL | Status: DC | PRN

## 2019-02-25 MED ORDER — BUPIVACAINE HCL (PF) 0.25 % IJ SOLN
INTRAMUSCULAR | Status: AC
  Filled 2019-02-25: qty 20

## 2019-02-25 MED ORDER — COCONUT OIL OIL: 1.0000 "application " | TOPICAL_OIL | Status: DC | PRN

## 2019-02-25 MED ORDER — NALBUPHINE HCL 10 MG/ML IJ SOLN: 5.0000 mg | INTRAMUSCULAR | Status: DC | PRN

## 2019-02-25 MED ORDER — IBUPROFEN 800 MG PO TABS
800.0000 mg | ORAL_TABLET | Freq: Four times a day (QID) | ORAL | Status: DC
  Administered 2019-02-26 – 2019-02-27 (×5): 800 mg via ORAL
  Filled 2019-02-25 (×5): qty 1

## 2019-02-25 MED ORDER — MORPHINE SULFATE (PF) 0.5 MG/ML IJ SOLN
INTRAMUSCULAR | Status: AC
  Filled 2019-02-25: qty 10

## 2019-02-25 MED ORDER — ONDANSETRON HCL 4 MG/2ML IJ SOLN: 4.0000 mg | Freq: Three times a day (TID) | INTRAMUSCULAR | Status: DC | PRN

## 2019-02-25 MED ORDER — TRANEXAMIC ACID-NACL 1000-0.7 MG/100ML-% IV SOLN
INTRAVENOUS | Status: DC | PRN
  Administered 2019-02-25: 1000 mg via INTRAVENOUS

## 2019-02-25 MED ORDER — SIMETHICONE 80 MG PO CHEW
80.0000 mg | CHEWABLE_TABLET | ORAL | Status: DC
  Administered 2019-02-25 – 2019-02-26 (×2): 80 mg via ORAL
  Filled 2019-02-25 (×2): qty 1

## 2019-02-25 MED ORDER — SODIUM CHLORIDE (PF) 0.9 % IJ SOLN
INTRAMUSCULAR | Status: AC
  Filled 2019-02-25: qty 20

## 2019-02-25 MED ORDER — SODIUM CHLORIDE 0.9% FLUSH: 3.0000 mL | INTRAVENOUS | Status: DC | PRN

## 2019-02-25 MED ORDER — METHYLERGONOVINE MALEATE 0.2 MG PO TABS: 0.2000 mg | ORAL_TABLET | ORAL | Status: DC | PRN

## 2019-02-25 MED ORDER — BUPIVACAINE IN DEXTROSE 0.75-8.25 % IT SOLN
INTRATHECAL | Status: AC
  Filled 2019-02-25: qty 2

## 2019-02-25 MED ORDER — LACTATED RINGERS IV SOLN: INTRAVENOUS | Status: DC

## 2019-02-25 MED ORDER — PHENYLEPHRINE HCL-NACL 20-0.9 MG/250ML-% IV SOLN
INTRAVENOUS | Status: AC
  Filled 2019-02-25: qty 250

## 2019-02-25 MED ORDER — NALBUPHINE HCL 10 MG/ML IJ SOLN
5.0000 mg | INTRAMUSCULAR | Status: DC | PRN
  Administered 2019-02-25 (×2): 5 mg via INTRAVENOUS
  Filled 2019-02-25 (×3): qty 1

## 2019-02-25 MED ORDER — SCOPOLAMINE 1 MG/3DAYS TD PT72
1.0000 | MEDICATED_PATCH | Freq: Once | TRANSDERMAL | Status: DC
  Administered 2019-02-25: 1.5 mg via TRANSDERMAL

## 2019-02-25 MED ORDER — SODIUM CHLORIDE 0.9 % IV SOLN
INTRAVENOUS | Status: DC | PRN
  Administered 2019-02-25: 12:00:00 via INTRAVENOUS

## 2019-02-25 MED ORDER — DIBUCAINE (PERIANAL) 1 % EX OINT: 1.0000 "application " | TOPICAL_OINTMENT | CUTANEOUS | Status: DC | PRN

## 2019-02-25 MED ORDER — SOD CITRATE-CITRIC ACID 500-334 MG/5ML PO SOLN
ORAL | Status: AC
  Filled 2019-02-25: qty 15

## 2019-02-25 MED ORDER — TETANUS-DIPHTH-ACELL PERTUSSIS 5-2.5-18.5 LF-MCG/0.5 IM SUSP: 0.5000 mL | Freq: Once | INTRAMUSCULAR | Status: DC

## 2019-02-25 MED ORDER — TRANEXAMIC ACID-NACL 1000-0.7 MG/100ML-% IV SOLN
INTRAVENOUS | Status: AC
  Filled 2019-02-25: qty 100

## 2019-02-25 MED ORDER — ONDANSETRON HCL 4 MG/2ML IJ SOLN
INTRAMUSCULAR | Status: DC | PRN
  Administered 2019-02-25: 4 mg via INTRAVENOUS

## 2019-02-25 MED ORDER — DIPHENHYDRAMINE HCL 50 MG/ML IJ SOLN: 12.5000 mg | INTRAMUSCULAR | Status: DC | PRN

## 2019-02-25 SURGICAL SUPPLY — 37 items
BENZOIN TINCTURE PRP APPL 2/3 (GAUZE/BANDAGES/DRESSINGS) ×2 IMPLANT
CLAMP CORD UMBIL (MISCELLANEOUS) IMPLANT
CLOSURE STERI STRIP 1/2 X4 (GAUZE/BANDAGES/DRESSINGS) ×2 IMPLANT
CLOTH BEACON ORANGE TIMEOUT ST (SAFETY) ×2 IMPLANT
DECANTER SPIKE VIAL GLASS SM (MISCELLANEOUS) ×2 IMPLANT
DRSG OPSITE POSTOP 4X10 (GAUZE/BANDAGES/DRESSINGS) ×2 IMPLANT
ELECT REM PT RETURN 9FT ADLT (ELECTROSURGICAL) ×2
ELECTRODE REM PT RTRN 9FT ADLT (ELECTROSURGICAL) ×1 IMPLANT
EXTRACTOR VACUUM M CUP 4 TUBE (SUCTIONS) IMPLANT
GLOVE BIO SURGEON STRL SZ7.5 (GLOVE) ×2 IMPLANT
GLOVE BIOGEL PI IND STRL 7.0 (GLOVE) ×1 IMPLANT
GLOVE BIOGEL PI INDICATOR 7.0 (GLOVE) ×1
GOWN STRL REUS W/TWL LRG LVL3 (GOWN DISPOSABLE) ×4 IMPLANT
KIT ABG SYR 3ML LUER SLIP (SYRINGE) IMPLANT
NEEDLE HYPO 22GX1.5 SAFETY (NEEDLE) ×2 IMPLANT
NEEDLE HYPO 25X5/8 SAFETYGLIDE (NEEDLE) IMPLANT
NEEDLE SPNL 20GX3.5 QUINCKE YW (NEEDLE) IMPLANT
NS IRRIG 1000ML POUR BTL (IV SOLUTION) ×2 IMPLANT
PACK C SECTION WH (CUSTOM PROCEDURE TRAY) ×2 IMPLANT
PAD OB MATERNITY 4.3X12.25 (PERSONAL CARE ITEMS) ×2 IMPLANT
STRIP CLOSURE SKIN 1/2X4 (GAUZE/BANDAGES/DRESSINGS) ×2 IMPLANT
SUT MNCRL 0 VIOLET CTX 36 (SUTURE) ×2 IMPLANT
SUT MNCRL AB 3-0 PS2 27 (SUTURE) IMPLANT
SUT MON AB 2-0 CT1 27 (SUTURE) ×2 IMPLANT
SUT MON AB-0 CT1 36 (SUTURE) ×4 IMPLANT
SUT MONOCRYL 0 CTX 36 (SUTURE) ×2
SUT PLAIN 0 NONE (SUTURE) ×2 IMPLANT
SUT PLAIN 2 0 (SUTURE)
SUT PLAIN 2 0 XLH (SUTURE) IMPLANT
SUT PLAIN ABS 2-0 CT1 27XMFL (SUTURE) IMPLANT
SUT VIC AB 3-0 SH 27 (SUTURE) ×2
SUT VIC AB 3-0 SH 27X BRD (SUTURE) ×2 IMPLANT
SYR 20CC LL (SYRINGE) IMPLANT
SYR CONTROL 10ML LL (SYRINGE) ×2 IMPLANT
TOWEL OR 17X24 6PK STRL BLUE (TOWEL DISPOSABLE) ×2 IMPLANT
TRAY FOLEY W/BAG SLVR 14FR LF (SET/KITS/TRAYS/PACK) ×2 IMPLANT
WATER STERILE IRR 1000ML POUR (IV SOLUTION) ×2 IMPLANT

## 2019-02-25 NOTE — Anesthesia Preprocedure Evaluation (Addendum)
Anesthesia Evaluation  Patient identified by MRN, date of birth, ID band Patient awake    Reviewed: Allergy & Precautions, NPO status , Patient's Chart, lab work & pertinent test results  Airway Mallampati: I  TM Distance: >3 FB Neck ROM: Full    Dental no notable dental hx. (+) Teeth Intact   Pulmonary neg pulmonary ROS,    Pulmonary exam normal breath sounds clear to auscultation       Cardiovascular Normal cardiovascular exam+ dysrhythmias Supra Ventricular Tachycardia  Rhythm:Regular Rate:Normal  POTS   Neuro/Psych  Headaches, Multiple Sclerosis negative psych ROS   GI/Hepatic Neg liver ROS, GERD  Medicated,  Endo/Other  diabetes, GestationalHx/o DM with 2nd pregnancy  Renal/GU negative Renal ROS  negative genitourinary   Musculoskeletal negative musculoskeletal ROS (+)   Abdominal   Peds  Hematology  (+) anemia ,   Anesthesia Other Findings   Reproductive/Obstetrics (+) Pregnancy Previous C/section x 3 Desires sterilization                            Anesthesia Physical Anesthesia Plan  ASA: II  Anesthesia Plan: Combined Spinal and Epidural   Post-op Pain Management:    Induction:   PONV Risk Score and Plan: 4 or greater and Scopolamine patch - Pre-op, Ondansetron and Treatment may vary due to age or medical condition  Airway Management Planned: Natural Airway  Additional Equipment:   Intra-op Plan:   Post-operative Plan:   Informed Consent: I have reviewed the patients History and Physical, chart, labs and discussed the procedure including the risks, benefits and alternatives for the proposed anesthesia with the patient or authorized representative who has indicated his/her understanding and acceptance.     Dental advisory given  Plan Discussed with: CRNA and Surgeon  Anesthesia Plan Comments:        Anesthesia Quick Evaluation

## 2019-02-25 NOTE — Lactation Note (Signed)
This note was copied from a baby's chart. Lactation Consultation Note  Patient Name: Karen Powers [id number] Date: 02/25/2019 Reason for consult: Initial assessment;Term  LC visited with [place] of term baby at 3 hrs post C/S.  Baby latched in PACU, but had trouble sustaining depth on the breast. Mom has lots of colostrum.  Baby rooting around.  Assisted with positioning in football hold.  Baby has slightly recessed lower jaw.  Baby opens, but not very wide.  Sandwiched breast to assist with baby latching more deeply.  Baby fed sleepily in football hold.  Mom very skilled at positioning and knowing when baby is latched deeply.    Encouraged Mom to continue STS with baby and watch for cues.  Mom comfortable with breast massage and hand expression into spoon.    Lactation brochure left in room.  Mom understands IP and OP lactation support available to her. Encouraged to ask for help prn.  Maternal Data Formula Feeding for Exclusion: No Has patient been taught Hand Expression?: Yes Does the patient have breastfeeding experience prior to this delivery?: Yes  Feeding Feeding Type: Breast Fed  LATCH Score Latch: Repeated attempts needed to sustain latch, nipple held in mouth throughout feeding, stimulation needed to elicit sucking reflex.  Audible Swallowing: A few with stimulation  Type of Nipple: Everted at rest and after stimulation  Comfort (Breast/Nipple): Soft / non-tender  Hold (Positioning): Assistance needed to correctly position infant at breast and maintain latch.  LATCH Score: 7  Interventions Interventions: Breast feeding basics reviewed;Assisted with latch;Skin to skin;Breast massage;Hand express;Breast compression;Adjust position;Support pillows;Position options;Expressed milk  Lactation Tools Discussed/Used WIC Program: No   Consult Status Consult Status: Follow-up Date: 02/26/19 Follow-up type: In-patient    Broadus John 02/25/2019, 4:11 PM

## 2019-02-25 NOTE — Anesthesia Procedure Notes (Signed)
Spinal  Patient location during procedure: OR Start time: 02/25/2019 11:51 AM End time: 02/25/2019 11:56 AM Staffing Anesthesiologist: Josephine Igo, MD Performed: anesthesiologist  Preanesthetic Checklist Completed: patient identified, site marked, surgical consent, pre-op evaluation, timeout performed, IV checked, risks and benefits discussed and monitors and equipment checked Spinal Block Patient position: sitting Prep: site prepped and draped and DuraPrep Patient monitoring: heart rate, cardiac monitor, continuous pulse ox and blood pressure Approach: midline Location: L4-5 Injection technique: single-shot Needle Needle type: Spinocan  Needle gauge: 24 G Needle length: 12.7 cm Needle insertion depth: 7 cm Catheter type: closed end flexible Catheter size: 19 g Assessment Sensory level: T4 Additional Notes Epidural performed with 17 Ga Touhy needle using LOR with air. No heme, CSF or paresthesias. SAB performed through the epidural needle. CSF clear with free flow, no heme or paresthesias. LA + Narcotics injected and spinal needle withdrawn. Epidural catheter threaded 5 cm into the epidural space and the epidural needle was withdrawn. A sterile dressing was applied and the patient was placed supine with LUD. She tolerated the procedure well and adequate sensory level was obtained.

## 2019-02-25 NOTE — Op Note (Signed)
OP note dictated [id number]

## 2019-02-25 NOTE — Anesthesia Postprocedure Evaluation (Signed)
Anesthesia Post Note  Patient: Karen Powers  Procedure(s) Performed: Repeat CESAREAN SECTION WITH BILATERAL TUBAL LIGATION (Bilateral Abdomen)     Patient location during evaluation: PACU Anesthesia Type: Spinal Level of consciousness: oriented and awake and alert Pain management: pain level controlled Vital Signs Assessment: post-procedure vital signs reviewed and stable Respiratory status: spontaneous breathing, respiratory function stable and nonlabored ventilation Cardiovascular status: blood pressure returned to baseline and stable Postop Assessment: no headache, no backache, no apparent nausea or vomiting, spinal receding and patient able to bend at knees Anesthetic complications: no    Last Vitals:  Vitals:   02/25/19 1405 02/25/19 1415  BP: (!) 123/56 123/73  Pulse: 64 61  Resp: 20 18  Temp:  36.5 C  SpO2: 100% 98%    Last Pain:  Vitals:   02/25/19 1415  TempSrc: Oral  PainSc:    Pain Goal:                   Nael Petrosyan A.

## 2019-02-25 NOTE — Op Note (Signed)
NAME: Karen Powers, Karen Powers MEDICAL RECORD [id number] ACCOUNT [id number] DATE OF BIRTH:03-16-1981 FACILITY: [facility] LOCATION: [facility] PHYSICIAN:Jaideep Pollack J. Ronita Hipps, MD  OPERATIVE REPORT  DATE OF PROCEDURE:  02/25/2019  PREOPERATIVE DIAGNOSIS:  Previous cesarean section x3.  POSTOPERATIVE DIAGNOSIS:  Previous cesarean section x3, elective sterilization plus incidental cystotomy with repair at the dome of the bladder.  SURGEON:  Brien Few, MD  ANESTHESIA:  Newt Minion, RNFA.  FLUID DEFICIT:  536 mL  COMPLICATIONS:  Cystotomy with delivery of vertex with repair.  ANESTHESIA:  Spinal and local.  DISPOSITION:  The patient was taken to recovery in good condition.  BRIEF OPERATIVE NOTE:  After being apprised of the risks of anesthesia, infection and bleeding to any surrounding organs, possible need for repair, delayed versus immediate complications to include bowel and bladder, internal vessel injury, possible need  for repair.  The patient was brought to the operating room where she was administered spinal anesthetic without complications.  Prepped and draped in usual sterile fashion.  A Foley catheter placed.  Clear urine noted.  At this time, a Pfannenstiel skin  incision was made in the area of the previous Pfannenstiel skin incision with a scalpel, carried down to fascia, which was nicked in the midline and opened transversely using Mayo scissors.  Rectus muscles dissected sharply in the midline.  Peritoneum  entered sharply and bluntly.  Bladder blade placed, bladder flap sharply developed left lower uterine segment densely adherent towards the lower portion.  Thereby a high Kerr hysterotomy incision was made about 4 cm cephalad until the bladder flap  extended with bandage scissors.  Amniotomy, clear fluid.  Atraumatic delivery, full-term living female, handed to pediatricians in attendance.  Apgars 9 and 9.  Cord blood collected.  Uterus was delivered from a posterior location  intact.  Uterus curetted  using a dry lap pack and exteriorized sterilization of the uterus reveals that during delivery of the vertex incidental cystotomy at the dome of the bladder was noted approximately 2 cm in length.  At this time, the cystotomy defect was closed using a  2-0 Vicryl suture in a continuous running fashion.  At this point, the uterine incision was closed using 0 Monocryl in continuous running fashion.  A second imbricating layer of 3-0 Vicryl was placed over the initial bladder repair.  Good hemostasis was  noted.  No evidence of extravasation from the incision is noted.  The bladder urine is clearing.  At this time, both tubes were identified and an avascular portion of the mesosalpinx of both tubes was identified.  Proximal and distal ties were placed for  a modified Pomeroy tubal ligation.  Tubal segment sent to pathology.  Good hemostasis was noted.  Normal ovaries appear at this time.  The uterus was replaced in the abdominal cavity.  Irrigation accomplished.  Good hemostasis was noted.  Peritoneum  closed using a 2-0 Monocryl in continuous running fashion.  Fascia closed using a 0 Monocryl in a tension running fashion.  Subcutaneous tissue reapproximated using a 2-0 plain.  Skin closed using a 4-0 Vicryl.  Dressing applied.  The patient tolerated  the procedure well, was awakened and transferred to recovery in good condition.  TN/NUANCE  D:02/25/2019 T:02/25/2019 JOB:[id number]

## 2019-02-25 NOTE — Transfer of Care (Signed)
Immediate Anesthesia Transfer of Care Note  Patient: Karen Powers  Procedure(s) Performed: Repeat CESAREAN SECTION WITH BILATERAL TUBAL LIGATION (Bilateral Abdomen)  Patient Location: PACU  Anesthesia Type:Spinal and Epidural  Level of Consciousness: awake, alert  and oriented  Airway & Oxygen Therapy: Patient Spontanous Breathing  Post-op Assessment: Report given to RN and Post -op Vital signs reviewed and stable  Post vital signs: Reviewed and stable  Last Vitals:  Vitals Value Taken Time  BP 110/87 02/25/19 1302  Temp    Pulse 87 02/25/19 1305  Resp 15 02/25/19 1305  SpO2 99 % 02/25/19 1305  Vitals shown include unvalidated device data.  Last Pain:  Vitals:   02/25/19 1010  TempSrc: Oral         Complications: No apparent anesthesia complications

## 2019-02-26 ENCOUNTER — Encounter (HOSPITAL_COMMUNITY): Payer: Self-pay | Admitting: Obstetrics and Gynecology

## 2019-02-26 LAB — CBC
HCT: 26.6 % — ABNORMAL LOW (ref 36.0–46.0)
Hemoglobin: 8.7 g/dL — ABNORMAL LOW (ref 12.0–15.0)
MCH: 31.2 pg (ref 26.0–34.0)
MCHC: 32.7 g/dL (ref 30.0–36.0)
MCV: 95.3 fL (ref 80.0–100.0)
Platelets: 154 10*3/uL (ref 150–400)
RBC: 2.79 MIL/uL — ABNORMAL LOW (ref 3.87–5.11)
RDW: 12.9 % (ref 11.5–15.5)
WBC: 9.1 10*3/uL (ref 4.0–10.5)
nRBC: 0 % (ref 0.0–0.2)

## 2019-02-26 LAB — SYPHILIS: RPR W/REFLEX TO RPR TITER AND TREPONEMAL ANTIBODIES, TRADITIONAL SCREENING AND DIAGNOSIS ALGORITHM: RPR Ser Ql: NONREACTIVE

## 2019-02-26 LAB — BIRTH TISSUE RECOVERY COLLECTION (PLACENTA DONATION)

## 2019-02-26 MED ORDER — SODIUM CHLORIDE 0.9 % IV SOLN
510.0000 mg | Freq: Once | INTRAVENOUS | Status: DC
  Filled 2019-02-26: qty 17

## 2019-02-26 MED ORDER — DIPHENHYDRAMINE HCL 25 MG PO CAPS: 25.0000 mg | ORAL_CAPSULE | Freq: Once | ORAL | Status: DC

## 2019-02-26 MED ORDER — RHO D IMMUNE GLOBULIN 1500 UNIT/2ML IJ SOSY
300.0000 ug | PREFILLED_SYRINGE | Freq: Once | INTRAMUSCULAR | Status: AC
  Administered 2019-02-26: 300 ug via INTRAVENOUS
  Filled 2019-02-26: qty 2

## 2019-02-26 MED ORDER — ACETAMINOPHEN 500 MG PO TABS
1000.0000 mg | ORAL_TABLET | Freq: Four times a day (QID) | ORAL | Status: DC | PRN
  Administered 2019-02-26: 1000 mg via ORAL
  Filled 2019-02-26: qty 2

## 2019-02-26 NOTE — Lactation Note (Signed)
This note was copied from a baby's chart. Lactation Consultation Note  Patient Name: Karen Powers QQPYP'P Date: 02/26/2019 Reason for consult: Term;Infant weight loss(4% weight  loss/ post circ from mid day ) Baby is 78 hours old  Per mom baby hasn't been interested in latching today and then was sleepy from the circ.  LC arrived for consult at 1705 , checked diaper , changed a small transitional stool, and during  The diaper change became spitty ( clear secretions , used the bulb syringe ).  Baby stooled again and , mom changed baby.  LC assisted to latch on the right breast / football / and worked on the depth/ the longest baby stayed  Latched was approx 7 mins with swallows and released. Mom's colostrum flows easily.  Tried a 2nd time and baby fed 5 mins and off and didn't seem interested.  LC recommended wearing shells between feedings or 10 mins prior to latch to elongate the nipple / areola  Complex to counteract the recessed chin.  2nd option if the baby still is having short feedings ( since the baby is 73 hours old  ) is to use a #24 NS  Fill with EBM  With curved tip syringe and latch, the appetizer of EBM may give the baby more incentive to  Stay in a consistent swallowing pattern also may give the roof the of the mouth more stimulation.   Mom receptive to hand expressing , extra pumping the options above.  Mom is an experienced breast feeder with latching issues in the past.   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   Maternal Data Has patient been taught Hand Expression?: Yes(mom experienced feeks comfortable) Does the patient have breastfeeding experience prior to this delivery?: Yes  Feeding Feeding Type: Breast Fed  LATCH Score Latch: Repeated attempts needed to sustain latch, nipple held in mouth throughout feeding, stimulation needed to elicit sucking reflex.  Audible Swallowing: Spontaneous and intermittent  Type of  Nipple: Everted at rest and after stimulation  Comfort (Breast/Nipple): Soft / non-tender  Hold (Positioning): Assistance needed to correctly position infant at breast and maintain latch.  LATCH Score: 8  Interventions Interventions: Breast feeding basics reviewed;Assisted with latch;Skin to skin;Breast massage;Hand express;Adjust position;Support pillows;Position options  Lactation Tools Discussed/Used Tools: Shells;Nipple Shields Nipple shield size: 24(mom exp with use of NS - LC provided - see LC note ) Shell Type: Inverted WIC Program: No   Consult Status Consult Status: Follow-up Date: 02/27/19 Follow-up type: In-patient    [place] 02/26/2019, 6:14 PM

## 2019-02-26 NOTE — Lactation Note (Signed)
This note was copied from a baby's chart. Lactation Consultation Note  Patient Name: Karen Powers [id number] Date: 02/26/2019 Reason for consult: Other (Comment)(mom ,dad , and baby sound asleep - Nsg to call when awake) [name] aware .   Maternal Data    Feeding    LATCH Score                   Interventions    Lactation Tools Discussed/Used     Consult Status Consult Status: Follow-up Date: 02/26/19 Follow-up type: In-patient    [name] 02/26/2019, 4:21 PM

## 2019-02-26 NOTE — Progress Notes (Signed)
No c/o; nml lochia, pain controlled, tol po,. Ambulating Nursing  Temp:  [97.7 F (36.5 C)-99.2 F (37.3 C)] 98.1 F (36.7 C) (07/02 0536) Pulse Rate:  [55-72] 67 (07/02 0536) Resp:  [16-23] 18 (07/02 0536) BP: (108-133)/(56-81) 116/62 (07/02 0536) SpO2:  [97 %-100 %] 100 % (07/02 0536)  A&ox3 rrr ctab Abd: soft,nt,nd; fundus firm and below umb; dressing c/d/i LE: no edema, nt bilat   Intake/Output Summary (Last 24 hours) at 02/26/2019 1320 Last data filed at 02/26/2019 1000 Gross per 24 hour  Intake 875 ml  Output 2325 ml  Net -1450 ml   CBC Latest Ref Rng & Units 02/26/2019 02/25/2019 02/21/2019  WBC 4.0 - 10.5 K/uL 9.1 11.6(H) 11.3(H)  Hemoglobin 12.0 - 15.0 g/dL 8.7(L) 11.4(L) 10.6(L)  Hematocrit 36.0 - 46.0 % 26.6(L) 34.4(L) 32.2(L)  Platelets 150 - 400 K/uL 154 202 195     A/P: pod 1 s/p rltcs/btl/cystotomy with repair 1. Doing well, plan for d/c foley after 48 hrs, contin to monitor I/o 2. Chronic anemia with acute change - asymptomatic, plan iv iron today then oral therapy

## 2019-02-26 NOTE — Progress Notes (Signed)
MD ordered IV iron for patient and patient declined needing infusion. Sent IV iron back to main pharmacy.

## 2019-02-26 NOTE — Lactation Note (Signed)
This note was copied from a baby's chart. Lactation Consultation Note  Patient Name: Karen Powers [id number] Date: 02/26/2019 Reason for consult: Term;Infant weight loss(4% weight  loss) Baby is 40 hours old   Maternal Data Has patient been taught Hand Expression?: Yes(mom experienced feeks comfortable)  Feeding Feeding Type: Breast Fed  LATCH Score Latch: Repeated attempts needed to sustain latch, nipple held in mouth throughout feeding, stimulation needed to elicit sucking reflex.  Audible Swallowing: Spontaneous and intermittent  Type of Nipple: Everted at rest and after stimulation  Comfort (Breast/Nipple): Soft / non-tender  Hold (Positioning): Assistance needed to correctly position infant at breast and maintain latch.  LATCH Score: 8  Interventions Interventions: Breast feeding basics reviewed;Assisted with latch;Skin to skin;Breast massage;Hand express;Adjust position;Support pillows;Position options  Lactation Tools Discussed/Used Tools: Shells;Nipple Shields Nipple shield size: 24 Shell Type: Inverted   Consult Status Consult Status: Follow-up Date: 02/27/19 Follow-up type: In-patient    Karen Powers 02/26/2019, 5:46 PM

## 2019-02-27 DIAGNOSIS — O26899 Other specified pregnancy related conditions, unspecified trimester: Secondary | ICD-10-CM

## 2019-02-27 DIAGNOSIS — O9902 Anemia complicating childbirth: Secondary | ICD-10-CM | POA: Diagnosis present

## 2019-02-27 DIAGNOSIS — Z6791 Unspecified blood type, Rh negative: Secondary | ICD-10-CM

## 2019-02-27 LAB — BPAM RBC
Blood Product Expiration Date: 202007142359
Blood Product Expiration Date: 202007182359
Unit Type and Rh: 9500
Unit Type and Rh: 9500

## 2019-02-27 LAB — RH IG WORKUP (INCLUDES ABO/RH)
ABO/RH(D): O NEG
Fetal Screen: NEGATIVE
Gestational Age(Wks): 39
Unit division: 0

## 2019-02-27 LAB — TYPE AND SCREEN
ABO/RH(D): O NEG
Antibody Screen: POSITIVE
Unit division: 0
Unit division: 0

## 2019-02-27 MED ORDER — COCONUT OIL OIL: 1.0000 "application " | TOPICAL_OIL | 0 refills | Status: DC | PRN

## 2019-02-27 MED ORDER — OXYCODONE-ACETAMINOPHEN 5-325 MG PO TABS: 1.0000 | ORAL_TABLET | ORAL | 0 refills | Status: DC | PRN

## 2019-02-27 MED ORDER — SIMETHICONE 80 MG PO CHEW: 80.0000 mg | CHEWABLE_TABLET | ORAL | 0 refills | Status: DC | PRN

## 2019-02-27 MED ORDER — MAGNESIUM OXIDE -MG SUPPLEMENT 400 (240 MG) MG PO TABS: 400.0000 mg | ORAL_TABLET | Freq: Every day | ORAL | Status: DC

## 2019-02-27 MED ORDER — NITROFURANTOIN MONOHYD MACRO 100 MG PO CAPS: 100.0000 mg | ORAL_CAPSULE | Freq: Two times a day (BID) | ORAL | 0 refills | Status: AC

## 2019-02-27 MED ORDER — IBUPROFEN 800 MG PO TABS: 800.0000 mg | ORAL_TABLET | Freq: Four times a day (QID) | ORAL | 0 refills | Status: DC

## 2019-02-27 MED ORDER — SENNOSIDES-DOCUSATE SODIUM 8.6-50 MG PO TABS: 2.0000 | ORAL_TABLET | ORAL | Status: DC

## 2019-02-27 MED ORDER — POLYSACCHARIDE IRON COMPLEX 150 MG PO CAPS: 150.0000 mg | ORAL_CAPSULE | Freq: Every day | ORAL | Status: DC

## 2019-02-27 MED FILL — MI-ACID GAS 80 MG TAB CHEW: 80 | 100 days supply | Qty: 100 | Fill #0

## 2019-02-27 MED FILL — IBUPROFEN 800 MG TAB: 800 | 7 days supply | Qty: 30 | Fill #0

## 2019-02-27 MED FILL — NITROFURANTOIN MONO-MCR 100: 100 | 5 days supply | Qty: 10 | Fill #0

## 2019-02-27 MED FILL — OXYCODONE-ACETAMINOPHEN 5-3: 5-325 | 3 days supply | Qty: 30 | Fill #0

## 2019-02-27 NOTE — Discharge Summary (Signed)
OB Discharge Summary  Patient Name: Karen Powers DOB: May 05, 1981 MRN: [id number]  Date of admission: 02/25/2019 Delivering provider: Brien Few   Date of discharge: 02/27/2019  Admitting diagnosis: Previous Cesarean Section x 3, Desires Sterilization Intrauterine pregnancy: [redacted]w[redacted]d     Secondary diagnosis:Principal Problem:   Postpartum care following cesarean delivery (7/1) Active Problems:   Previous cesarean delivery affecting pregnancy   Maternal anemia, with delivery   Rh negative, maternal  Additional problems:incidental cystotomy     Discharge diagnosis:  Patient Active Problem List   Diagnosis Date Noted  . Maternal anemia, with delivery 02/27/2019  . Rh negative, maternal 02/27/2019  . Previous cesarean delivery affecting pregnancy 02/25/2019  . Maternal iron deficiency anemia 01/06/2016  . Acute blood loss anemia 01/06/2016  . Postpartum care following cesarean delivery (7/1) 01/05/2016                                                               Post partum procedures:rhogam  Pain control: Spinal  Complications: None   Hospital course:  Sceduled C/S   38 y.o. yo [id number] at [redacted]w[redacted]d was admitted to the hospital 02/25/2019 for scheduled cesarean section with the following indication:Elective Repeat.  Membrane Rupture Time/Date: 12:15 PM ,02/25/2019   Patient delivered a Viable infant.02/25/2019  Details of operation can be found in separate operative note.  Pateint had an uncomplicated postpartum course.  She is ambulating, tolerating a regular diet, passing flatus, and urinating well. Patient is discharged home in stable condition on  02/27/19 with indwelling foley catheter x 5 completed days, to be removed by patient at home. Antibiotic prophylaxis with Macrobid 100 mg BID x 5 days for increased risk of UTI d/t prolonged indwelling catheterization of bladder.          Physical exam  Vitals:   02/26/19 1623 02/26/19 1705 02/26/19 2159 02/27/19 0500  BP: 139/83  130/84 129/87 117/66  Pulse: 79 72 81 82  Resp: 17 18 16 16   Temp: 98.6 F (37 C)  98.5 F (36.9 C) 98.1 F (36.7 C)  TempSrc: Oral  Oral Oral  SpO2: 98%  98% 99%  Weight:      Height:       General: alert, cooperative and no distress Lochia: appropriate Uterine Fundus: firm Incision: Dressing is clean, dry, and intact DVT Evaluation: No cords or calf tenderness. No significant calf/ankle edema. Labs: Lab Results  Component Value Date   WBC 9.1 02/26/2019   HGB 8.7 (L) 02/26/2019   HCT 26.6 (L) 02/26/2019   MCV 95.3 02/26/2019   PLT 154 02/26/2019   CMP Latest Ref Rng & Units 02/21/2019  Glucose 70 - 99 mg/dL 101(H)  BUN 6 - 20 mg/dL 8  Creatinine 0.44 - 1.00 mg/dL 0.55  Sodium 135 - 145 mmol/L 135  Potassium 3.5 - 5.1 mmol/L 3.9  Chloride 98 - 111 mmol/L 103  CO2 22 - 32 mmol/L 20(L)  Calcium 8.9 - 10.3 mg/dL 8.6(L)  Total Protein 6.5 - 8.1 g/dL 5.8(L)  Total Bilirubin 0.3 - 1.2 mg/dL 0.5  Alkaline Phos 38 - 126 U/L 122  AST 15 - 41 U/L 19  ALT 0 - 44 U/L 12    Vaccines: TDaP UTD         Flu  UTD  Discharge instruction: per After Visit Summary and "Baby and Me Booklet".  After Visit Meds:  Allergies as of 02/27/2019      Reactions   Adhesive [tape] Other (See Comments)   Takes skin off   Ciprofloxacin Rash, Other (See Comments)   Has patient had a PCN reaction causing immediate rash, facial/tongue/throat swelling, SOB or lightheadedness with hypotension: No Has patient had a PCN reaction causing severe rash involving mucus membranes or skin necrosis: No Has patient had a PCN reaction that required hospitalization No Has patient had a PCN reaction occurring within the last 10 years: Yes If all of the above answers are "NO", then may proceed with Cephalosporin use.      Medication List    TAKE these medications   acetaminophen 500 MG tablet Commonly known as: TYLENOL Take 1,000 mg by mouth every 6 (six) hours as needed for moderate pain or headache.    cetirizine 10 MG tablet Commonly known as: ZYRTEC Take 10 mg by mouth every other day.   coconut oil Oil Apply 1 application topically as needed.   esomeprazole 20 MG packet Commonly known as: NEXIUM Take 20 mg by mouth every other day.   ibuprofen 800 MG tablet Commonly known as: ADVIL Take 1 tablet (800 mg total) by mouth every 6 (six) hours.   iron polysaccharides 150 MG capsule Commonly known as: Ferrex 150 Take 1 capsule (150 mg total) by mouth daily.   Magnesium Oxide 400 (240 Mg) MG Tabs Take 1 tablet (400 mg total) by mouth daily. For prevention of constipation.   nitrofurantoin (macrocrystal-monohydrate) 100 MG capsule Commonly known as: Macrobid Take 1 capsule (100 mg total) by mouth 2 (two) times daily for 5 days.   oxyCODONE-acetaminophen 5-325 MG tablet Commonly known as: PERCOCET/ROXICET Take 1-2 tablets by mouth every 4 (four) hours as needed for moderate pain.   senna-docusate 8.6-50 MG tablet Commonly known as: Senokot-S Take 2 tablets by mouth daily. Start taking on: February 28, 2019   simethicone 80 MG chewable tablet Commonly known as: MYLICON Chew 1 tablet (80 mg total) by mouth as needed for flatulence.       Diet: routine diet  Activity: Advance as tolerated. Pelvic rest for 6 weeks.   Postpartum contraception: Not Discussed  Newborn Data: Live born female  Birth Weight: 8 lb 1.1 oz (3660 g) APGAR: 8, 9  Newborn Delivery   Birth date/time: 02/25/2019 12:16:00 Delivery type: C-Section, Low Transverse Trial of labor: No C-section categorization: Repeat      named Evan Baby Feeding: Breast Disposition:home with mother   Delivery Report:  Review the Delivery Report for details.    Follow up: Follow-up Information    Brien Few, MD. Schedule an appointment as soon as possible for a visit in 6 week(s).   Specialty: Obstetrics and Gynecology Contact information: [place] [address] [phone]              Signed: Juliene Pina, CNM, MSN 02/27/2019, 10:05 AM

## 2019-02-27 NOTE — Lactation Note (Signed)
This note was copied from a baby's chart. Lactation Consultation Note  Patient Name: Karen Powers Date: 02/27/2019 Reason for consult: Follow-up assessment;Infant weight loss;Term(6% weight loss/ Bili check 11.9 at 41 hours/ early D/C ) Baby is 31 hours old  As LC entered the room baby latched/ per mom he turned it around last night, comfortable latch, LC noted  Multiple swallows, increased with compressions.  Per mom dad supplemented with a bottle x 1 during the night EBM . Wearing the shells and that is helping, denies soreness,  Familiar with engorgement prevention and tx and sore nipples, storage of breast milk.  LC provided the [employer] pump.  Mom aware of the [place] [facility] resources.    Maternal Data    Feeding Feeding Type: Breast Fed  LATCH Score Latch: (latched with depth )  Audible Swallowing: (multiple swallows )     Comfort (Breast/Nipple): (per mom comfortable )  Hold (Positioning): (mom independent with latch )     Interventions Interventions: Breast feeding basics reviewed  Lactation Tools Discussed/Used Pump Review: Milk Storage(per mom familiar with storage )   Consult Status Consult Status: Complete Date: 02/27/19    Karen Powers 02/27/2019, 9:29 AM

## 2019-02-27 NOTE — Progress Notes (Addendum)
Patient ID: Karen Powers, female   DOB: Jun 27, 1981, 38 y.o.   MRN: [id number] Subjective: POD# 2 Live born female  Birth Weight: 8 lb 1.1 oz (3660 g) APGAR: 8, 9  Newborn Delivery   Birth date/time: 02/[birth year]/2019 12:16:00 Delivery type: C-Section, Low Transverse Trial of labor: No C-section categorization: Repeat     Baby name: Ellard Artis Delivering provider: Brien Few   circumcision completed Feeding: breast  Pain control at delivery: Spinal   Reports feeling well, desires home discharge  Patient reports tolerating PO.   Breast symptoms: + colostrum Pain controlled with PO meds Denies HA/SOB/C/P/N/V/dizziness. Flatus present. She reports vaginal bleeding as normal, without clots.  She is ambulating, indwelling foley cath draining clear yellow urine, reports clogging yesterday and was able to flush and clear flow.     Objective:   VS:    Vitals:   02/26/19 1623 02/26/19 1705 02/26/19 2159 02/27/19 0500  BP: 139/83 130/84 129/87 117/66  Pulse: 79 72 81 82  Resp: 17 18 16 16   Temp: 98.6 F (37 C)  98.5 F (36.9 C) 98.1 F (36.7 C)  TempSrc: Oral  Oral Oral  SpO2: 98%  98% 99%  Weight:      Height:          Intake/Output Summary (Last 24 hours) at 02/27/2019 2409 Last data filed at 02/27/2019 0500 Gross per 24 hour  Intake -  Output 975 ml  Net -975 ml        Recent Labs    02/25/19 0700 02/26/19 0552  WBC 11.6* 9.1  HGB 11.4* 8.7*  HCT 34.4* 26.6*  PLT 202 154     Blood type: --/--/O NEG (07/02 0552)  Rubella: Immune (12/06 0000)  Vaccines: TDaP UTD         Flu    UTD   Physical Exam:  General: alert, cooperative and no distress Abdomen: soft, nontender, normal bowel sounds Incision: clean, dry and intact Uterine Fundus: firm, below umbilicus, nontender Lochia: minimal Ext: no edema, redness or tenderness in the calves or thighs      Assessment/Plan: 38 y.o.   POD# 2. [id number]                  Principal Problem:   Postpartum care following  cesarean delivery (7/1) Active Problems:   Previous cesarean delivery affecting pregnancy   Maternal anemia, with delivery  - declined IV feraheme  - oral fe and mag ox supplement started   Rh negative, maternal  - rhogam received postpartum. 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                DC home today w/ instructions  F/U at [facility] in 6 weeks and PRN, call in 3 days for appointment  POC in consult w/ Dr. Benjie Karvonen             Routine post-op care  Juliene Pina, CNM, MSN 02/27/2019, 8:33 AM

## 2019-02-27 NOTE — Progress Notes (Signed)
Pt feeling well Declined IV Fe. Incidental cystotomy 2cm at dome of bladder from uterine extension upon uncomplicated delivery to fetal vertex. Urine clear Discussed with MFM and Urology Will dc Foley after 5d post op. Plan discussed with patient

## 2019-03-03 MED FILL — LABETALOL HCL 100 MG TABLET: 100 | 30 days supply | Qty: 90 | Fill #0

## 2019-03-07 ENCOUNTER — Observation Stay (HOSPITAL_COMMUNITY)
Admission: AD | Admit: 2019-03-07 | Discharge: 2019-03-08 | Disposition: A | Discharge status: Home / Self Care | Payer: No Typology Code available for payment source | Attending: Obstetrics and Gynecology | Admitting: Obstetrics and Gynecology

## 2019-03-07 ENCOUNTER — Other Ambulatory Visit: Payer: Self-pay

## 2019-03-07 ENCOUNTER — Encounter (HOSPITAL_COMMUNITY): Payer: Self-pay

## 2019-03-07 DIAGNOSIS — O165 Unspecified maternal hypertension, complicating the puerperium: Principal | ICD-10-CM

## 2019-03-07 DIAGNOSIS — E119 Type 2 diabetes mellitus without complications: Secondary | ICD-10-CM | POA: Insufficient documentation

## 2019-03-07 DIAGNOSIS — Z79899 Other long term (current) drug therapy: Secondary | ICD-10-CM | POA: Insufficient documentation

## 2019-03-07 DIAGNOSIS — O2433 Unspecified pre-existing diabetes mellitus in the puerperium: Secondary | ICD-10-CM | POA: Insufficient documentation

## 2019-03-07 DIAGNOSIS — G35 Multiple sclerosis: Secondary | ICD-10-CM | POA: Insufficient documentation

## 2019-03-07 HISTORY — DX: Unspecified maternal hypertension, complicating the puerperium: O16.5

## 2019-03-07 LAB — COMPREHENSIVE METABOLIC PANEL WITH GFR
ALT: 20 U/L (ref 0–44)
AST: 18 U/L (ref 15–41)
Albumin: 3 g/dL — ABNORMAL LOW (ref 3.5–5.0)
Alkaline Phosphatase: 84 U/L (ref 38–126)
Anion gap: 9 (ref 5–15)
BUN: 13 mg/dL (ref 6–20)
CO2: 21 mmol/L — ABNORMAL LOW (ref 22–32)
Calcium: 8.7 mg/dL — ABNORMAL LOW (ref 8.9–10.3)
Chloride: 108 mmol/L (ref 98–111)
Creatinine, Ser: 0.64 mg/dL (ref 0.44–1.00)
GFR calc Af Amer: >60 mL/min
GFR calc non Af Amer: >60 mL/min
Glucose, Bld: 97 mg/dL (ref 70–99)
Potassium: 3.7 mmol/L (ref 3.5–5.1)
Sodium: 138 mmol/L (ref 135–145)
Total Bilirubin: 0.5 mg/dL (ref 0.3–1.2)
Total Protein: 5.7 g/dL — ABNORMAL LOW (ref 6.5–8.1)

## 2019-03-07 LAB — PROTEIN / CREATININE RATIO, URINE
Creatinine, Urine: 86.27 mg/dL
Protein Creatinine Ratio: 0.13 mg/mg{creat} (ref 0.00–0.15)
Total Protein, Urine: 11 mg/dL

## 2019-03-07 LAB — CBC
HCT: 29.5 % — ABNORMAL LOW (ref 36.0–46.0)
Hemoglobin: 9.5 g/dL — ABNORMAL LOW (ref 12.0–15.0)
MCH: 31.5 pg (ref 26.0–34.0)
MCHC: 32.2 g/dL (ref 30.0–36.0)
MCV: 97.7 fL (ref 80.0–100.0)
Platelets: 221 10*3/uL (ref 150–400)
RBC: 3.02 MIL/uL — ABNORMAL LOW (ref 3.87–5.11)
RDW: 13.2 % (ref 11.5–15.5)
WBC: 6.1 10*3/uL (ref 4.0–10.5)
nRBC: 0 % (ref 0.0–0.2)

## 2019-03-07 MED ORDER — LABETALOL HCL 5 MG/ML IV SOLN: 40.0000 mg | INTRAVENOUS | Status: DC | PRN

## 2019-03-07 MED ORDER — LABETALOL HCL 5 MG/ML IV SOLN: 80.0000 mg | INTRAVENOUS | Status: DC | PRN

## 2019-03-07 MED ORDER — MAGNESIUM SULFATE BOLUS VIA INFUSION
4.0000 g | Freq: Once | INTRAVENOUS | Status: AC
  Administered 2019-03-07: 4 g via INTRAVENOUS
  Filled 2019-03-07: qty 500

## 2019-03-07 MED ORDER — MAGNESIUM SULFATE 40 G IN LACTATED RINGERS - SIMPLE
2.0000 g/h | INTRAVENOUS | Status: AC
  Administered 2019-03-07 (×2): 2 g/h via INTRAVENOUS
  Filled 2019-03-07 (×2): qty 500

## 2019-03-07 MED ORDER — LABETALOL HCL 5 MG/ML IV SOLN
20.0000 mg | INTRAVENOUS | Status: DC | PRN
  Administered 2019-03-07: 02:00:00 20 mg via INTRAVENOUS
  Filled 2019-03-07: qty 4

## 2019-03-07 MED ORDER — NIFEDIPINE 10 MG PO CAPS: 10.0000 mg | ORAL_CAPSULE | ORAL | Status: DC | PRN

## 2019-03-07 MED ORDER — NIFEDIPINE 10 MG PO CAPS: 20.0000 mg | ORAL_CAPSULE | ORAL | Status: DC | PRN

## 2019-03-07 MED ORDER — NIFEDIPINE ER OSMOTIC RELEASE 30 MG PO TB24
30.0000 mg | ORAL_TABLET | Freq: Every day | ORAL | Status: DC
  Administered 2019-03-07 – 2019-03-08 (×2): 30 mg via ORAL
  Filled 2019-03-07 (×2): qty 1

## 2019-03-07 MED ORDER — ACETAMINOPHEN 325 MG PO TABS
650.0000 mg | ORAL_TABLET | Freq: Four times a day (QID) | ORAL | Status: DC | PRN
  Administered 2019-03-07 (×2): 650 mg via ORAL
  Filled 2019-03-07 (×2): qty 2

## 2019-03-07 MED ORDER — HYDRALAZINE HCL 20 MG/ML IJ SOLN: 10.0000 mg | INTRAMUSCULAR | Status: DC | PRN

## 2019-03-07 MED ORDER — LACTATED RINGERS IV SOLN
INTRAVENOUS | Status: DC
  Administered 2019-03-07 (×2): via INTRAVENOUS

## 2019-03-07 MED ORDER — IBUPROFEN 600 MG PO TABS
600.0000 mg | ORAL_TABLET | Freq: Four times a day (QID) | ORAL | Status: DC | PRN
  Administered 2019-03-07: 600 mg via ORAL
  Filled 2019-03-07: qty 1

## 2019-03-07 NOTE — MAU Provider Note (Signed)
History     CSN: [id number]  Arrival date and time: 03/07/19 1914   First Provider Initiated Contact with Patient 03/07/19 0128      Chief Complaint  Patient presents with  . Hypertension  . visual changes   HPI  Ms.  Karen Powers is a 38 y.o. year old [id number] female at Unknown weeks gestation who presents to MAU reporting S/P RCS on 7/1 with elevated BP that started on 7/7 (180/100 & 160/high 90's to 100). Dr. Ronita Hipps placed her on Labetalol 100 mg TID, then Thursday her BP was 140/88 in the office so he adjusted her dosing to Labetalol 200 mg in AM, 100 mg at lunchtime and then 200 mg hs. She took all but the night dose on 7/10. She also reports feeling the same chest pressure and SOB that she felt before starting on Labetalol. Her husband took her BP at home tonight: 180/100 & 178/110. She also reports a "slight" H/A that she rates a 2/10, blurry vision (like she had before starting Labetalol), and RT epigastric pain.  Past Medical History:  Diagnosis Date  . Acute blood loss anemia 01/06/2016  . Anemia   . Diabetes mellitus without complication ([place]) 7829   Gestational diabetes only.  Marland Kitchen Dysrhythmia   . Gestational diabetes    2nd preg only  . Headache(784.0)    otc med prn  . Heartburn in pregnancy   . Maternal iron deficiency anemia 01/06/2016  . MS (multiple sclerosis) ([place])   . Postpartum care following cesarean delivery (5/11) 01/05/2016  . Postpartum hypertension 03/07/2019  . POTS (postural orthostatic tachycardia syndrome)   . SVT (supraventricular tachycardia) ([place])    No problems since 10/2012 - no med needed  . Vaginal Pap smear, abnormal   . Vitamin deficiency     Past Surgical History:  Procedure Laterality Date  . ATRIAL ABLATION SURGERY  2003   FAILED  . CESAREAN SECTION  2006   Breech  . CESAREAN SECTION  2006   breech  . CESAREAN SECTION N/A 12/19/2013   Procedure: REPEAT CESAREAN SECTION;  Surgeon: Lovenia Kim, MD;  Location: [facility];  Service:  Obstetrics;  Laterality: N/A;  EDD: 12/25/13  . CESAREAN SECTION N/A 01/05/2016   Procedure: Repeat CESAREAN SECTION;  Surgeon: Brien Few, MD;  Location: [facility];  Service: Obstetrics;  Laterality: N/A;  EDD: 01/12/16   . CESAREAN SECTION WITH BILATERAL TUBAL LIGATION Bilateral 02/25/2019   Procedure: Repeat CESAREAN SECTION WITH BILATERAL TUBAL LIGATION;  Surgeon: Brien Few, MD;  Location: [facility];  Service: Obstetrics;  Laterality: Bilateral;  Heather, RNFA  EDD: 03/04/19 Allergy: Adhesive, Paxil, Cipro  . COLPOSCOPY    . WISDOM TOOTH EXTRACTION      Family History  Problem Relation Age of Onset  . Multiple sclerosis Mother   . Migraines Mother   . Emphysema Father   . Heart disease Father   . Migraines Father   . Migraines Sister   . Spina bifida Brother        half brother  . Cancer Paternal Grandfather        LUNG  . Diabetes Maternal Grandmother     Social History   Tobacco Use  . Smoking status: Never Smoker  . Smokeless tobacco: Never Used  Substance Use Topics  . Alcohol use: No  . Drug use: No    Allergies:  Allergies  Allergen Reactions  . Adhesive [Tape] Other (See Comments)    Takes skin  off  . Ciprofloxacin Rash and Other (See Comments)    Has patient had a PCN reaction causing immediate rash, facial/tongue/throat swelling, SOB or lightheadedness with hypotension: No Has patient had a PCN reaction causing severe rash involving mucus membranes or skin necrosis: No Has patient had a PCN reaction that required hospitalization No Has patient had a PCN reaction occurring within the last 10 years: Yes If all of the above answers are "NO", then may proceed with Cephalosporin use.    Medications Prior to Admission  Medication Sig Dispense Refill Last Dose  . ibuprofen (ADVIL) 800 MG tablet Take 1 tablet (800 mg total) by mouth every 6 (six) hours. 30 tablet 0 03/06/2019 at Unknown time  . iron polysaccharides (FERREX 150) 150 MG capsule Take 1  capsule (150 mg total) by mouth daily.   03/06/2019 at Unknown time  . labetalol (NORMODYNE) 200 MG tablet Take 200 mg by mouth every 8 (eight) hours.     Marland Kitchen oxyCODONE-acetaminophen (PERCOCET/ROXICET) 5-325 MG tablet Take 1-2 tablets by mouth every 4 (four) hours as needed for moderate pain. 30 tablet 0 03/06/2019 at Unknown time  . simethicone (MYLICON) 80 MG chewable tablet Chew 1 tablet (80 mg total) by mouth as needed for flatulence. 30 tablet 0 03/06/2019 at Unknown time  . acetaminophen (TYLENOL) 500 MG tablet Take 1,000 mg by mouth every 6 (six) hours as needed for moderate pain or headache.      . cetirizine (ZYRTEC) 10 MG tablet Take 10 mg by mouth every other day.     . coconut oil OIL Apply 1 application topically as needed.  0   . esomeprazole (NEXIUM) 20 MG packet Take 20 mg by mouth every other day.     . Magnesium Oxide 400 (240 Mg) MG TABS Take 1 tablet (400 mg total) by mouth daily. For prevention of constipation. 30 tablet    . senna-docusate (SENOKOT-S) 8.6-50 MG tablet Take 2 tablets by mouth daily.       Review of Systems  Constitutional: Negative.   HENT: Negative.   Eyes: Positive for visual disturbance.  Respiratory: Positive for chest tightness (pressure) and shortness of breath.   Cardiovascular: Negative.   Gastrointestinal: Negative.   Endocrine: Negative.   Genitourinary: Negative.   Musculoskeletal: Negative.   Skin: Negative.   Allergic/Immunologic: Negative.   Neurological: Positive for headaches.  Hematological: Negative.   Psychiatric/Behavioral: Negative.    Physical Exam   Blood pressure (!) 143/82, pulse 67, temperature 98.8 F (37.1 C), resp. rate 16, height 5\' 10"  (1.778 m), weight 84.8 kg, SpO2 98 %, currently breastfeeding.  Physical Exam  Nursing note and vitals reviewed. Constitutional: She is oriented to person, place, and time. She appears well-developed and well-nourished.  HENT:  Head: Normocephalic and atraumatic.  Eyes: Pupils are  equal, round, and reactive to light.  Neck: Normal range of motion.  Cardiovascular: Normal rate and regular rhythm.  Respiratory: Effort normal.  GI: Soft.  Genitourinary:    Genitourinary Comments: Pelvic deferred   Musculoskeletal:        General: Edema (R>L) present.  Neurological: She is alert and oriented to person, place, and time. She has normal reflexes.  Skin: Skin is warm and dry.  Psychiatric: She has a normal mood and affect. Her behavior is normal. Judgment and thought content normal.   MAU Course  Procedures  MDM CCUA CBC CMP P/C Ratio Serial BP's  LR 1000 ml @ 125 ml/hr Labetalol IV Protocol  *Consult with Dr.  Eure @ [phone] - notified of patient's complaints, assessments, & lab results, recommended tx plan admission and start on magnesium sulfate  Consult with Dr. Ronita Hipps @ [phone] - notified of patient's complaints, assessments, lab results & Dr. Brynda Greathouse recommendation for admission and Magnesium Sulfate results - ok to admit, agrees with plan   Results for orders placed or performed during the hospital encounter of 03/07/19 (from the past 24 hour(s))  CBC     Status: Abnormal   Collection Time: 03/07/19  1:17 AM  Result Value Ref Range   WBC 6.1 4.0 - 10.5 K/uL   RBC 3.02 (L) 3.87 - 5.11 MIL/uL   Hemoglobin 9.5 (L) 12.0 - 15.0 g/dL   HCT 29.5 (L) 36.0 - 46.0 %   MCV 97.7 80.0 - 100.0 fL   MCH 31.5 26.0 - 34.0 pg   MCHC 32.2 30.0 - 36.0 g/dL   RDW 13.2 11.5 - 15.5 %   Platelets 221 150 - 400 K/uL   nRBC 0.0 0.0 - 0.2 %  Comprehensive metabolic panel     Status: Abnormal   Collection Time: 03/07/19  1:17 AM  Result Value Ref Range   Sodium 138 135 - 145 mmol/L   Potassium 3.7 3.5 - 5.1 mmol/L   Chloride 108 98 - 111 mmol/L   CO2 21 (L) 22 - 32 mmol/L   Glucose, Bld 97 70 - 99 mg/dL   BUN 13 6 - 20 mg/dL   Creatinine, Ser 0.64 0.44 - 1.00 mg/dL   Calcium 8.7 (L) 8.9 - 10.3 mg/dL   Total Protein 5.7 (L) 6.5 - 8.1 g/dL   Albumin 3.0 (L) 3.5 - 5.0 g/dL    AST 18 15 - 41 U/L   ALT 20 0 - 44 U/L   Alkaline Phosphatase 84 38 - 126 U/L   Total Bilirubin 0.5 0.3 - 1.2 mg/dL   GFR calc non Af Amer >60 >60 mL/min   GFR calc Af Amer >60 >60 mL/min   Anion gap 9 5 - 15  Protein / creatinine ratio, urine     Status: None   Collection Time: 03/07/19  1:26 AM  Result Value Ref Range   Creatinine, Urine 86.27 mg/dL   Total Protein, Urine 11 mg/dL   Protein Creatinine Ratio 0.13 0.00 - 0.15 mg/mg[Cre]       Assessment and Plan  Postpartum hypertension  - Admit to OBSCU  - Start Magnesium Sulfate 4 gm loading dose; then 2 gm/hr - Change to Procardia po - Routine admission orders - Dr. Ronita Hipps assumes care of patient upon admission   Laury Deep, MSN, CNM 03/07/2019, 1:29 AM

## 2019-03-07 NOTE — Progress Notes (Signed)
Patient ID: Karen Powers, female   DOB: 1981/05/21, 38 y.o.   MRN: [id number] Feeling better. No headache or epigastric pain BP (!) 143/85 (BP Location: Right Arm)   Pulse 64   Temp 97.6 F (36.4 C) (Axillary)   Resp 18   Ht 5\' 10"  (1.778 m)   Wt 84.8 kg   SpO2 98%   Breastfeeding Yes   BMI 26.83 kg/m  Lungs: CTA CV: RRR Abd: No RUQ tenderness UO excellent and clear Ext: 2+ DTRs Neuro Non focal IMP:PP HTN Diuresing on Mag SO4 BP stable. Will add Procardia XL DC Mag at 24 hrs

## 2019-03-07 NOTE — MAU Provider Note (Addendum)
History and Physical examination       Chief Complaint  Patient presents with  . Hypertension  . visual changes   Hypertension Associated symptoms include headaches and shortness of breath.    Ms.  Karen Powers is a 38 y.o. year old [phone] female at 10d PP who presents to MAU reporting S/P RCS on 7/1 with elevated BP that started on 7/7 (160/high 90's to 100 in office with nl exam and labs).  Started on Labetalol 100 mg TID, then Thursday her BP was 140/88, 150/94 in the office so  adjusted her dosing to Labetalol 200 mg in AM, 100 mg at lunchtime and then 200 mg hs. She took all but the night dose on 7/10. On 7/9, NO SOB was noted with nl exam, nl O2 sat in office and nl labs.. Her husband took her BP at home tonight: 180/100 & 178/110. She also reports a mild HA. DId not take Tylenol or her dose of labetalol tonight due to bradycardia.  Past Medical History:  Diagnosis Date  . Acute blood loss anemia 01/06/2016  . Anemia   . Diabetes mellitus without complication ([place]) 0737   Gestational diabetes only.  Marland Kitchen Dysrhythmia   . Gestational diabetes    2nd preg only  . Headache(784.0)    otc med prn  . Heartburn in pregnancy   . Maternal iron deficiency anemia 01/06/2016  . MS (multiple sclerosis) ([place])   . Postpartum care following cesarean delivery (5/11) 01/05/2016  . Postpartum hypertension 03/07/2019  . POTS (postural orthostatic tachycardia syndrome)   . SVT (supraventricular tachycardia) ([place])    No problems since 10/2012 - no med needed  . Vaginal Pap smear, abnormal   . Vitamin deficiency     Past Surgical History:  Procedure Laterality Date  . ATRIAL ABLATION SURGERY  2003   FAILED  . CESAREAN SECTION  2006   Breech  . CESAREAN SECTION  2006   breech  . CESAREAN SECTION N/A 12/19/2013   Procedure: REPEAT CESAREAN SECTION;  Surgeon: Lovenia Kim, MD;  Location: [facility];  Service: Obstetrics;  Laterality: N/A;  EDD: 12/25/13  . CESAREAN SECTION N/A 01/05/2016   Procedure: Repeat CESAREAN SECTION;  Surgeon: Brien Few, MD;  Location: [facility];  Service: Obstetrics;  Laterality: N/A;  EDD: 01/12/16   . CESAREAN SECTION WITH BILATERAL TUBAL LIGATION Bilateral 02/25/2019   Procedure: Repeat CESAREAN SECTION WITH BILATERAL TUBAL LIGATION;  Surgeon: Brien Few, MD;  Location: [facility];  Service: Obstetrics;  Laterality: Bilateral;  Heather, RNFA  EDD: 03/04/19 Allergy: Adhesive, Paxil, Cipro  . COLPOSCOPY    . WISDOM TOOTH EXTRACTION      Family History  Problem Relation Age of Onset  . Multiple sclerosis Mother   . Migraines Mother   . Emphysema Father   . Heart disease Father   . Migraines Father   . Migraines Sister   . Spina bifida Brother        half brother  . Cancer Paternal Grandfather        LUNG  . Diabetes Maternal Grandmother     Social History   Tobacco Use  . Smoking status: Never Smoker  . Smokeless tobacco: Never Used  Substance Use Topics  . Alcohol use: No  . Drug use: No    Allergies:  Allergies  Allergen Reactions  . Adhesive [Tape] Other (See Comments)    Takes skin off  . Ciprofloxacin Rash and Other (See Comments)  Has patient had a PCN reaction causing immediate rash, facial/tongue/throat swelling, SOB or lightheadedness with hypotension: No Has patient had a PCN reaction causing severe rash involving mucus membranes or skin necrosis: No Has patient had a PCN reaction that required hospitalization No Has patient had a PCN reaction occurring within the last 10 years: Yes If all of the above answers are "NO", then may proceed with Cephalosporin use.    Medications Prior to Admission  Medication Sig Dispense Refill Last Dose  . ibuprofen (ADVIL) 800 MG tablet Take 1 tablet (800 mg total) by mouth every 6 (six) hours. 30 tablet 0 03/06/2019 at Unknown time  . iron polysaccharides (FERREX 150) 150 MG capsule Take 1 capsule (150 mg total) by mouth daily.   03/06/2019 at Unknown time  . labetalol  (NORMODYNE) 200 MG tablet Take 200 mg by mouth every 8 (eight) hours.     Marland Kitchen oxyCODONE-acetaminophen (PERCOCET/ROXICET) 5-325 MG tablet Take 1-2 tablets by mouth every 4 (four) hours as needed for moderate pain. 30 tablet 0 03/06/2019 at Unknown time  . simethicone (MYLICON) 80 MG chewable tablet Chew 1 tablet (80 mg total) by mouth as needed for flatulence. 30 tablet 0 03/06/2019 at Unknown time  . acetaminophen (TYLENOL) 500 MG tablet Take 1,000 mg by mouth every 6 (six) hours as needed for moderate pain or headache.      . cetirizine (ZYRTEC) 10 MG tablet Take 10 mg by mouth every other day.     . coconut oil OIL Apply 1 application topically as needed.  0   . esomeprazole (NEXIUM) 20 MG packet Take 20 mg by mouth every other day.     . Magnesium Oxide 400 (240 Mg) MG TABS Take 1 tablet (400 mg total) by mouth daily. For prevention of constipation. 30 tablet    . senna-docusate (SENOKOT-S) 8.6-50 MG tablet Take 2 tablets by mouth daily.       ROS otherwise negative  Physical Exam   Blood pressure (!) 146/86, pulse 67, temperature 98.2 F (36.8 C), temperature source Oral, resp. rate 16, height 5\' 10"  (1.778 m), weight 84.8 kg, SpO2 98 %, currently breastfeeding.  PE WDWN WF NAD Neck : supple with FROM Lgs: CTA , no rales or rhonchi noted CV: RRR ABd: Non tender, NO RUQ tenderness No CVAT Minimal bleeding Pelvic deferred EXt: DTRs 2+, no edema or clonus Neuro: nonfocal Skin: intact      CBC    Component Value Date/Time   WBC 6.1 03/07/2019 0117   RBC 3.02 (L) 03/07/2019 0117   HGB 9.5 (L) 03/07/2019 0117   HCT 29.5 (L) 03/07/2019 0117   PLT 221 03/07/2019 0117   MCV 97.7 03/07/2019 0117   MCH 31.5 03/07/2019 0117   MCHC 32.2 03/07/2019 0117   RDW 13.2 03/07/2019 0117   LYMPHSABS 2.8 10/22/2017 0544   MONOABS 0.5 10/22/2017 0544   EOSABS 0.2 10/22/2017 0544   BASOSABS 0.0 10/22/2017 0544   CMP     Component Value Date/Time   NA 138 03/07/2019 0117   K 3.7  03/07/2019 0117   CL 108 03/07/2019 0117   CO2 21 (L) 03/07/2019 0117   GLUCOSE 97 03/07/2019 0117   BUN 13 03/07/2019 0117   CREATININE 0.64 03/07/2019 0117   CALCIUM 8.7 (L) 03/07/2019 0117   PROT 5.7 (L) 03/07/2019 0117   ALBUMIN 3.0 (L) 03/07/2019 0117   AST 18 03/07/2019 0117   ALT 20 03/07/2019 0117   ALKPHOS 84 03/07/2019 0117  BILITOT 0.5 03/07/2019 0117   GFRNONAA >60 03/07/2019 0117   GFRAA >60 03/07/2019 0117          Assessment and Plan  Postpartum hypertension -severe range, nl labs - Admit to OBSCU  - Start Magnesium Sulfate 4 gm loading dose; then 2 gm/hr - Change to Procardia po - Routine admission orders I/Os

## 2019-03-07 NOTE — MAU Note (Signed)
Saw Dr Ronita Hipps couple days ago for HTN. Was told to take Labetalol 200mg  morning, 100mg  at lunch and 200mg  hs. Took all the above Friday except the 16mn 200mg . Started Labetalol on Tues and then increased to above dosages on Thurs. Before starting Labetalol had some chest pressure and hard to take deep breath. THat improved after starting the med. On Friday again had the pressure in chest feeling and had husband take manual B/P. 180/100 and 178/110. Slight h/a and vision "blurry and spotty" which was occurring before starting Labetalol and then got better and now has returned.

## 2019-03-08 MED ORDER — NIFEDIPINE ER 30 MG PO TB24: 30.0000 mg | ORAL_TABLET | Freq: Every day | ORAL | 1 refills | Status: DC

## 2019-03-08 NOTE — Lactation Note (Signed)
Lactation Consultation Note  Patient Name: DELAILAH SPIETH [id number] Date: 03/08/2019   1323 - 47 - I visited Ms. Calles today to see if she could use any support with lactation She is a readmit, brought in on 7/11. She states that she hopes that she will be discharged today. She brought her personal UMR pump ([employer] employee) with her, and she has been pumping and labelling her milk.  Ms. Dever states that she has good milk production. Baby was not with her in the room at this time. She states that baby is having some difficulty at the breast. He *may* have stridor, but the main issue is that feedings take a long time and baby loses a lot of milk with latch (leaks). Her pediatrician stated that it may be due to [place]. She plans to follow up with her pedication and to monitor feedings closely.  We discussed a few possible positional interventions such as laid back/biologicial nurturing position and side-lying position. We also discussed that it may help baby manage flow to pre-pump a bit through the initial let-down.   Ultimately I recommended that she schedule a follow up OP appointment with lactation. Mom was interested, but she wanted to make the call vs. A referral. I provided her with contact information for setting up an appointment.  No further questions at this time.     Lenore Manner 03/08/2019, 2:35 PM

## 2019-03-08 NOTE — Discharge Instructions (Signed)
Preeclampsia and Eclampsia °Preeclampsia is a serious condition that may develop during pregnancy. This condition causes high blood pressure and increased protein in your urine along with other symptoms, such as headaches and vision changes. These symptoms may develop as the condition gets worse. Preeclampsia may occur at 20 weeks of pregnancy or later. °Diagnosing and treating preeclampsia early is very important. If not treated early, it can cause serious problems for you and your baby. One problem it can lead to is eclampsia. Eclampsia is a condition that causes muscle jerking or shaking (convulsions or seizures) and other serious problems for the mother. During pregnancy, delivering your baby may be the best treatment for preeclampsia or eclampsia. For most women, preeclampsia and eclampsia symptoms go away after giving birth. °In rare cases, a woman may develop preeclampsia after giving birth (postpartum preeclampsia). This usually occurs within 48 hours after childbirth but may occur up to 6 weeks after giving birth. °What are the causes? °The cause of preeclampsia is not known. °What increases the risk? °The following risk factors make you more likely to develop preeclampsia: °· Being pregnant for the first time. °· Having had preeclampsia during a past pregnancy. °· Having a family history of preeclampsia. °· Having high blood pressure. °· Being pregnant with more than one baby. °· Being 35 or older. °· Being African-American. °· Having kidney disease or diabetes. °· Having medical conditions such as lupus or blood diseases. °· Being very overweight (obese). °What are the signs or symptoms? °The most common symptoms are: °· Severe headaches. °· Vision problems, such as blurred or double vision. °· Abdominal pain, especially upper abdominal pain. °Other symptoms that may develop as the condition gets worse include: °· Sudden weight gain. °· Sudden swelling of the hands, face, legs, and feet. °· Severe nausea  and vomiting. °· Numbness in the face, arms, legs, and feet. °· Dizziness. °· Urinating less than usual. °· Slurred speech. °· Convulsions or seizures. °How is this diagnosed? °There are no screening tests for preeclampsia. Your health care provider will ask you about symptoms and check for signs of preeclampsia during your prenatal visits. You may also have tests that include: °· Checking your blood pressure. °· Urine tests to check for protein. Your health care provider will check for this at every prenatal visit. °· Blood tests. °· Monitoring your baby's heart rate. °· Ultrasound. °How is this treated? °You and your health care provider will determine the treatment approach that is best for you. Treatment may include: °· Having more frequent prenatal exams to check for signs of preeclampsia, if you have an increased risk for preeclampsia. °· Medicine to lower your blood pressure. °· Staying in the hospital, if your condition is severe. There, treatment will focus on controlling your blood pressure and the amount of fluids in your body (fluid retention). °· Taking medicine (magnesium sulfate) to prevent seizures. This may be given as an injection or through an IV. °· Taking a low-dose aspirin during your pregnancy. °· Delivering your baby early. You may have your labor started with medicine (induced), or you may have a cesarean delivery. °Follow these instructions at home: °Eating and drinking ° °· Drink enough fluid to keep your urine pale yellow. °· Avoid caffeine. °Lifestyle °· Do not use any products that contain nicotine or tobacco, such as cigarettes and e-cigarettes. If you need help quitting, ask your health care provider. °· Do not use alcohol or drugs. °· Avoid stress as much as possible. Rest and get   plenty of sleep. °General instructions °· Take over-the-counter and prescription medicines only as told by your health care provider. °· When lying down, lie on your left side. This keeps pressure off your  major blood vessels. °· When sitting or lying down, raise (elevate) your feet. Try putting some pillows underneath your lower legs. °· Exercise regularly. Ask your health care provider what kinds of exercise are best for you. °· Keep all follow-up and prenatal visits as told by your health care provider. This is important. °How is this prevented? °There is no known way of preventing preeclampsia or eclampsia from developing. However, to lower your risk of complications and detect problems early: °· Get regular prenatal care. Your health care provider may be able to diagnose and treat the condition early. °· Maintain a healthy weight. Ask your health care provider for help managing weight gain during pregnancy. °· Work with your health care provider to manage any long-term (chronic) health conditions you have, such as diabetes or kidney problems. °· You may have tests of your blood pressure and kidney function after giving birth. °· Your health care provider may have you take low-dose aspirin during your next pregnancy. °Contact a health care provider if: °· You have symptoms that your health care provider told you may require more treatment or monitoring, such as: °? Headaches. °? Nausea or vomiting. °? Abdominal pain. °? Dizziness. °? Light-headedness. °Get help right away if: °· You have severe: °? Abdominal pain. °? Headaches that do not get better. °? Dizziness. °? Vision problems. °? Confusion. °? Nausea or vomiting. °· You have any of the following: °? A seizure. °? Sudden, rapid weight gain. °? Sudden swelling in your hands, ankles, or face. °? Trouble moving any part of your body. °? Numbness in any part of your body. °? Trouble speaking. °? Abnormal bleeding. °· You faint. °Summary °· Preeclampsia is a serious condition that may develop during pregnancy. °· This condition causes high blood pressure and increased protein in your urine along with other symptoms, such as headaches and vision  changes. °· Diagnosing and treating preeclampsia early is very important. If not treated early, it can cause serious problems for you and your baby. °· Get help right away if you have symptoms that your health care provider told you to watch for. °This information is not intended to replace advice given to you by your health care provider. Make sure you discuss any questions you have with your health care provider. °Document Released: 08/10/2000 Document Revised: 04/15/2018 Document Reviewed: 03/19/2016 °Elsevier Patient Education © 2020 [employer] ° °

## 2019-03-08 NOTE — Progress Notes (Signed)
Patient ID: Karen Powers, female   DOB: Aug 13, 1981, 38 y.o.   MRN: [id number] Feeling  Much better. No headache or epigastric pain Excellent diuresis . UO clear and copious  BP 134/83 (BP Location: Right Arm)   Pulse 84   Temp 98.5 F (36.9 C) (Oral)   Resp 18   Ht 5\' 10"  (1.778 m)   Wt 84.8 kg   SpO2 98%   Breastfeeding Yes   BMI 26.83 kg/m    UO 10L in 24h  Lungs: CTA CV: RRR Abd: No RUQ tenderness UO excellent and clear Ext: 2+ DTRs Neuro Non focal  IMP:PP HTN- improved Mag SO4 dc'ed BP stable. Will dc on Procardia XL FU office 3d

## 2019-03-08 NOTE — Progress Notes (Signed)
Discharge instructions given to pt. Prescription was printed for procardia, but was not signed by MD. Notified Dr. Ronita Hipps and was told that he sent an electronic prescription over to [facility] [place]. Was also told to tell the pt to call the office first thing in the morning. Discussed with pt signs and symptoms to report the the MD, upcoming appointments, and meds. Pt aware to call the doctors office first thing tomorrow morning. Pt discharged from hospital in stable condition.

## 2019-03-08 NOTE — Discharge Summary (Signed)
OB Discharge Summary     Patient Name: Karen Powers DOB: Dec 06, 1980 MRN: [id number]  Date of admission: 03/07/2019 Delivering MD: Brien Few   Date of discharge: 03/08/2019  Admitting diagnosis: delivered July 1st  having high bp and light headed and problems with vision Intrauterine pregnancy: Unknown     Secondary diagnosis:  Active Problems:   Postpartum hypertension  Additional problems: na     Discharge diagnosis: Term Pregnancy Delivered and Preeclampsia (mild)                                                                                                Post partum procedures:na  Augmentation: na  Complications: None  Hospital course:  Sceduled C/S   38 y.o. yo [id number] at Unknown was admitted to the hospital 03/07/2019 for scheduled cesarean section with the following indication:Elective Repeat.  Membrane Rupture Time/Date: 12:15 PM ,02/25/2019   Patient delivered a Viable infant.02/25/2019  Details of operation can be found in separate operative note.  Pateint had an uncomplicated postpartum course.  She is ambulating, tolerating a regular diet, passing flatus, and urinating well. Patient is discharged home in stable condition on  03/08/19         Physical exam  Vitals:   03/08/19 0351 03/08/19 0355 03/08/19 0822 03/08/19 1131  BP: 120/70 120/70 139/88 134/83  Pulse: 94 94 (!) 102 84  Resp:  18 18 18   Temp:  98.3 F (36.8 C) 98.4 F (36.9 C) 98.5 F (36.9 C)  TempSrc:  Oral Oral Oral  SpO2: 97% 97% 98% 98%  Weight:      Height:       General: alert, cooperative and no distress Lochia: appropriate Uterine Fundus: firm Incision: Healing well with no significant drainage DVT Evaluation: No evidence of DVT seen on physical exam. No cords or calf tenderness. Labs: Lab Results  Component Value Date   WBC 6.1 03/07/2019   HGB 9.5 (L) 03/07/2019   HCT 29.5 (L) 03/07/2019   MCV 97.7 03/07/2019   PLT 221 03/07/2019   CMP Latest Ref Rng & Units  03/07/2019  Glucose 70 - 99 mg/dL 97  BUN 6 - 20 mg/dL 13  Creatinine 0.44 - 1.00 mg/dL 0.64  Sodium 135 - 145 mmol/L 138  Potassium 3.5 - 5.1 mmol/L 3.7  Chloride 98 - 111 mmol/L 108  CO2 22 - 32 mmol/L 21(L)  Calcium 8.9 - 10.3 mg/dL 8.7(L)  Total Protein 6.5 - 8.1 g/dL 5.7(L)  Total Bilirubin 0.3 - 1.2 mg/dL 0.5  Alkaline Phos 38 - 126 U/L 84  AST 15 - 41 U/L 18  ALT 0 - 44 U/L 20    Discharge instruction: per After Visit Summary and "Baby and Me Booklet".  After visit meds:  Allergies as of 03/08/2019      Reactions   Adhesive [tape] Other (See Comments)   Takes skin off   Ciprofloxacin Rash, Other (See Comments)   Has patient had a PCN reaction causing immediate rash, facial/tongue/throat swelling, SOB or lightheadedness with hypotension: No Has patient had a PCN reaction causing severe rash involving  mucus membranes or skin necrosis: No Has patient had a PCN reaction that required hospitalization No Has patient had a PCN reaction occurring within the last 10 years: Yes If all of the above answers are "NO", then may proceed with Cephalosporin use.      Medication List    STOP taking these medications   labetalol 200 MG tablet Commonly known as: NORMODYNE     TAKE these medications   acetaminophen 500 MG tablet Commonly known as: TYLENOL Take 1,000 mg by mouth every 6 (six) hours as needed for moderate pain or headache.   cetirizine 10 MG tablet Commonly known as: ZYRTEC Take 10 mg by mouth every other day.   coconut oil Oil Apply 1 application topically as needed.   esomeprazole 20 MG packet Commonly known as: NEXIUM Take 20 mg by mouth every other day.   ibuprofen 800 MG tablet Commonly known as: ADVIL Take 1 tablet (800 mg total) by mouth every 6 (six) hours.   iron polysaccharides 150 MG capsule Commonly known as: Ferrex 150 Take 1 capsule (150 mg total) by mouth daily.   Magnesium Oxide 400 (240 Mg) MG Tabs Take 1 tablet (400 mg total) by mouth  daily. For prevention of constipation.   NIFEdipine 30 MG 24 hr tablet Commonly known as: ADALAT CC Take 1 tablet (30 mg total) by mouth daily. Start taking on: March 09, 2019   oxyCODONE-acetaminophen 5-325 MG tablet Commonly known as: PERCOCET/ROXICET Take 1-2 tablets by mouth every 4 (four) hours as needed for moderate pain.   senna-docusate 8.6-50 MG tablet Commonly known as: Senokot-S Take 2 tablets by mouth daily.   simethicone 80 MG chewable tablet Commonly known as: MYLICON Chew 1 tablet (80 mg total) by mouth as needed for flatulence.       Diet: routine diet  Activity: Advance as tolerated. Pelvic rest for 6 weeks.   Outpatient follow up:3d Follow up Appt:No future appointments. Follow up Visit:No follow-ups on file.  Postpartum contraception: Tubal Ligation  Newborn Data: Live born female  Birth Weight: 8 lb 1.1 oz (3660 g) APGAR: 8, 9  Newborn Delivery   Birth date/time: 02/25/2019 12:16:00 Delivery type: C-Section, Low Transverse Trial of labor: No C-section categorization: Repeat      Baby Feeding: Breast Disposition:home with mother   03/08/2019 Lovenia Kim, MD

## 2019-03-17 ENCOUNTER — Encounter: Payer: Self-pay | Admitting: Medical

## 2019-03-18 ENCOUNTER — Telehealth: Payer: Self-pay | Admitting: Medical

## 2019-03-18 DIAGNOSIS — G35 Multiple sclerosis: Secondary | ICD-10-CM

## 2019-03-18 NOTE — Telephone Encounter (Signed)
Referral to neurologist placed. 

## 2019-04-08 MED FILL — NIFEdipine ER 30 MG TB24: 30 | 30 days supply | Qty: 30 | Fill #0

## 2019-04-27 ENCOUNTER — Encounter: Payer: Self-pay | Admitting: Neurology

## 2019-04-27 ENCOUNTER — Ambulatory Visit: Payer: No Typology Code available for payment source | Admitting: Neurology

## 2019-04-27 ENCOUNTER — Other Ambulatory Visit: Payer: Self-pay

## 2019-04-27 VITALS — BP 123/90 | HR 69 | Temp 97.8°F | Ht 70.0 in | Wt 171.5 lb

## 2019-04-27 DIAGNOSIS — G35 Multiple sclerosis: Secondary | ICD-10-CM | POA: Diagnosis not present

## 2019-04-27 DIAGNOSIS — I471 Supraventricular tachycardia: Secondary | ICD-10-CM | POA: Diagnosis not present

## 2019-04-27 DIAGNOSIS — O165 Unspecified maternal hypertension, complicating the puerperium: Secondary | ICD-10-CM | POA: Diagnosis not present

## 2019-04-27 DIAGNOSIS — R000 Tachycardia, unspecified: Secondary | ICD-10-CM

## 2019-04-27 DIAGNOSIS — I498 Other specified cardiac arrhythmias: Secondary | ICD-10-CM

## 2019-04-27 DIAGNOSIS — G90A Postural orthostatic tachycardia syndrome (POTS): Secondary | ICD-10-CM

## 2019-04-27 DIAGNOSIS — I951 Orthostatic hypotension: Secondary | ICD-10-CM

## 2019-04-27 NOTE — Progress Notes (Signed)
OUTPATIENT NEUROLOGY CLINIC    Provider:  Larey Seat, MD  Primary Care Physician:  Elise [address] [place] [address]     Referring Provider:  Mackie Pai, [address] [place],  [place] [address]          Chief Complaint according to patient   Patient presents with:    . New Patient (Initial Visit)           HISTORY OF PRESENT ILLNESS:  Karen Powers is a 38 y.o. year old  Caucasian female patient seen here face to face upon referral  by Dr Harvie Heck on 04/27/2019 .  Chief concern according to patient : MS- transfer of care needed.    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 Her fourth baby. Her second husband is the father of the youngest 42 boys.  She was diagnosed in 2009- was seen  at [facility] by Dr Gara Kroner. Her mother is followed for MS there.   She  has a past medical history of Acute blood loss anemia (01/06/2016), Anemia, Diabetes mellitus without complication ([place]) (123456), Dysrhythmia, Gestational diabetes, Headache(784.0), Heartburn in pregnancy, Maternal iron deficiency anemia (01/06/2016), MS (multiple sclerosis) ([place]), Postpartum care following cesarean delivery (5/11) (01/05/2016), Postpartum hypertension (03/07/2019), POTS (postural orthostatic tachycardia syndrome), SVT (supraventricular tachycardia) ([place]), Vaginal Pap smear, abnormal, and Vitamin deficiency.     Family medical /sleep history: MS is the only autoimmune disease in her mother, a brother has celiac disease- age 74 .   Social history:  Patient is married, mother of 3 and working as an Health visitor.  She  lives in a household with 6 persons. The patient currently works in shifts( night/ rotating) Saturday and Sunday, [employer].   Pets are present- One dog.  Tobacco use: never .  ETOH use; haven't in 5.5 years, always  with a baby, nursing. , Caffeine intake in form of Coffee( none) Soda( 3-4) Tea ( none) or energy drinks. Regular exercise in form of walking- she had paused after delivery- had a lot of blood pressure issues Peri-delivery and since delivery- was on labetalol, then magnesium, now nifedipine.   Sleep affected by nursing , and soon returning to work.      Review of Systems: Out of a complete 14 system review, the patient complains of only the following symptoms, and all other reviewed systems are negative:    First MS ; Superficial numbness on the right leg- felt it when shaving, felt tired, not well tolerant of heat.  Last relapse was dysesthesias on the right leg, a feeling of water dripping down her leg- dizziness, vertigo- with  head turnning. c spine lesions found after last elapse.  Social History   Socioeconomic History  . Marital status: Married    Spouse name: Karen Powers  . Number of children: 4  . Years of education: Not on file  . Highest education level: Not on file  Occupational History  . 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 Financial resource strain: Not on file  . Food insecurity    Worry: Not on file    Inability: Not on file  . Transportation needs    Medical: Not on file    Non-medical: Not on file  Tobacco Use  .  Smoking status: Never Smoker  . Smokeless tobacco: Never Used  Substance and Sexual Activity  . Alcohol use: No  . Drug use: No  . Sexual activity: Yes    Birth control/protection: Pill    Comment: pregnant  Lifestyle  . Physical activity    Days per week: Not on file    Minutes per session: Not on file  . Stress: Not on file  Relationships  . Social Herbalist on phone: Not on file    Gets together: Not on file    Attends religious service: Not on file    Active member of club or organization: Not on file    Attends meetings of clubs or organizations: Not on file    Relationship status: Not on file  Other  Topics Concern  . Not on file  Social History Narrative   [place] daily   Right handed    Lives with husband and 4 kids   She is ER nurse        Family History  Problem Relation Age of Onset  . Multiple sclerosis Mother   . Migraines Mother   . Emphysema Father   . Heart disease Father   . Migraines Father   . Migraines Sister   . Spina bifida Brother        half brother  . Cancer Paternal Grandfather        LUNG  . Diabetes Maternal Grandmother     Past Medical History:  Diagnosis Date  . Acute blood loss anemia 01/06/2016  . Anemia   . Diabetes mellitus without complication ([place]) 123456   Gestational diabetes only.  Marland Kitchen Dysrhythmia   . Gestational diabetes    2nd preg only  . Headache(784.0)    otc med prn  . Heartburn in pregnancy   . Maternal iron deficiency anemia 01/06/2016  . MS (multiple sclerosis) ([place])   . Postpartum care following cesarean delivery (5/11) 01/05/2016  . Postpartum hypertension 03/07/2019  . POTS (postural orthostatic tachycardia syndrome)   . SVT (supraventricular tachycardia) ([place])    No problems since 10/2012 - no med needed  . Vaginal Pap smear, abnormal   . Vitamin deficiency     Past Surgical History:  Procedure Laterality Date  . ATRIAL ABLATION SURGERY  2003   FAILED  . CESAREAN SECTION  2006   Breech  . CESAREAN SECTION  2006   breech  . CESAREAN SECTION N/A 12/19/2013   Procedure: REPEAT CESAREAN SECTION;  Surgeon: Lovenia Kim, MD;  Location: [facility];  Service: Obstetrics;  Laterality: N/A;  EDD: 12/25/13  . CESAREAN SECTION N/A 01/05/2016   Procedure: Repeat CESAREAN SECTION;  Surgeon: Brien Few, MD;  Location: [facility];  Service: Obstetrics;  Laterality: N/A;  EDD: 01/12/16   . CESAREAN SECTION WITH BILATERAL TUBAL LIGATION Bilateral 02/25/2019   Procedure: Repeat CESAREAN SECTION WITH BILATERAL TUBAL LIGATION;  Surgeon: Brien Few, MD;  Location: [facility];  Service: Obstetrics;  Laterality: Bilateral;  Heather,  RNFA  EDD: 03/04/19 Allergy: Adhesive, Paxil, Cipro  . COLPOSCOPY    . WISDOM TOOTH EXTRACTION       Current Outpatient Medications on File Prior to Visit  Medication Sig Dispense Refill  . acetaminophen (TYLENOL) 500 MG tablet Take 1,000 mg by mouth every 6 (six) hours as needed for moderate pain or headache.     . cetirizine (ZYRTEC) 10 MG tablet Take 10 mg by mouth as needed.     Marland Kitchen  esomeprazole (NEXIUM) 20 MG packet Take 20 mg by mouth every other day.    . ibuprofen (ADVIL) 800 MG tablet Take 1 tablet (800 mg total) by mouth every 6 (six) hours. (Patient taking differently: Take 800 mg by mouth as needed. ) 30 tablet 0  . NIFEdipine (ADALAT CC) 30 MG 24 hr tablet Take 1 tablet (30 mg total) by mouth daily. 30 tablet 1   No current facility-administered medications on file prior to visit.     Allergies  Allergen Reactions  . Adhesive [Tape] Other (See Comments)    Takes skin off  . Ciprofloxacin Rash and Other (See Comments)    Has patient had a PCN reaction causing immediate rash, facial/tongue/throat swelling, SOB or lightheadedness with hypotension: No Has patient had a PCN reaction causing severe rash involving mucus membranes or skin necrosis: No Has patient had a PCN reaction that required hospitalization No Has patient had a PCN reaction occurring within the last 10 years: Yes If all of the above answers are "NO", then may proceed with Cephalosporin use.    CLINICAL DATA:  Multiple sclerosis. Dizziness and near-syncope when turning head. New neurologic event.  EXAM: MRI HEAD WITHOUT AND WITH CONTRAST  MRI CERVICAL SPINE WITHOUT AND WITH CONTRAST  TECHNIQUE: Multiplanar, multiecho pulse sequences of the brain and surrounding structures, and cervical spine, to include the craniocervical junction and cervicothoracic junction, were obtained without and with intravenous contrast.  CONTRAST:  61mL MULTIHANCE GADOBENATE DIMEGLUMINE 529 MG/ML IV SOLN  COMPARISON:   MRI of the brain and cervical spine 07/13/2017.  FINDINGS: MRI HEAD FINDINGS  Brain: Periventricular T2 signal changes are stable from the prior study. There is some involvement of the corpus callosum. No new lesions are present. There is no restricted diffusion or enhancement.  No acute infarct, hemorrhage, or mass lesion is present. The ventricles are of normal size. No significant extra-axial fluid collection is present.  Postcontrast images demonstrate no pathologic enhancement.  Vascular: Flow is present in the major intracranial arteries.  Skull and upper cervical spine: Skull base is normal. Craniocervical junction is normal. Marrow signal is unremarkable.  Sinuses/Orbits: The paranasal sinuses and mastoid air cells are clear. Globes and orbits are within normal limits.  MRI CERVICAL SPINE FINDINGS  Alignment: AP alignment is anatomic.  Vertebrae: Marrow signal and vertebral body heights are normal.  Cord: New T2 signal changes are present in the left hemi cord at the C2 level. This extends 2 cm cephalo caudad is noted laterally and anteriorly. There is no associated enhancement.  A smaller 11 mm cephalo caudad area of T2 signal changes present in the far right lateral hemi cord at the C2-3 level.  No other focal cord signal changes are present to the lowest imaged level T2-3.  Posterior Fossa, vertebral arteries, paraspinal tissues: The craniocervical junction is normal. Flow is present in the vertebral arteries bilaterally.  Disc levels:  A mild leftward disc osteophyte complex is again noted at C5-6. No significant stenosis is present.  No other significant focal disc disease is present.  IMPRESSION: 1. New T2 signal changes in the left hemicord at C2 extending 2 cm cephalo caudad. 2. Smaller new area of T2 signal change in the far right lateral hemi cord at C2-3 measuring 11 mm cephalo caudad. These lesions likely represent new  areas of demyelination within the spinal cord. 3. Intracranial periventricular T2 signal changes are stable. 4. No acute intracranial abnormality.   Electronically Signed   By: San Morelle  M.D.   On: 10/22/2017 12:16  Abbott, Lily Lovings, NP [facility].  - 10/29/2017 10:00 AM EST Formatting of this note might be different from the original. Ms. Kammerman is a 38 y.o. female with relapsing remitting multiple sclerosis currently off disease modifying therapy who returns to the outpatient Neurology clinic for follow up. She had been on copaxone in the past but stopped when she became pregnant. She had a baby boy in April 2015 and breast fed. The plan was for her to restart Copaxone but she was lost to follow up until seen by Dr. Shelia Media in October 2018. In the interim she had a second baby boy in May 2017. She was last seen earlier this month and shared with me that she was planning to try to have another baby in May when her youngest child will be 66 years old. The decision was made to hold off on restarting disease modifying therapy due to planned pregnancy.  Current status:  She is seen today for a hospital follow up. She was hospitalized at [facility] last week with a MS relapse. She reported numbness in both legs and vertigo. She was treated with a 3 day course of IV solumedrol which she tolerated well. Reports of imaging viewable in care everywhere. Brain MRI stable with no new lesions. Cervical spine MRI shows 2 new lesions which were not present on MRI done in 11/18. There was no mention of enhancement with either lesion. She also had a CTA of the head and neck with no arterial abnormality detected.  She tells me that she is some improved since treatment with steroids but continues to have vertigo if she turns her head to either side or looks down and back up. The numbness in her egs is also improved. 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      Physical exam:  Today's Vitals    04/27/19 1356  BP: 123/90  Pulse: 69  Temp: 97.8 F (36.6 C)  Weight: 171 lb 8 oz (77.8 kg)  Height: 5\' 10"  (1.778 m)   Body mass index is 24.61 kg/m.   Wt Readings from Last 3 Encounters:  04/27/19 171 lb 8 oz (77.8 kg)  03/07/19 187 lb (84.8 kg)  02/25/19 193 lb (87.5 kg)     Ht Readings from Last 3 Encounters:  04/27/19 5\' 10"  (1.778 m)  03/07/19 5\' 10"  (1.778 m)  02/25/19 5\' 10"  (1.778 m)      General: The patient is awake, alert and appears not in acute distress. The patient is well groomed. Head: Normocephalic, atraumatic. Neck is supple.   Nasal airflow patent.   Dental status: intact. Cardiovascular:  irregular rate and cardiac rhythm by pulse, without distended neck veins. Respiratory: Lungs are clear to auscultation.  Skin:  Without evidence of ankle edema, or rash.  Trunk: The patient's posture is erect.   Neurologic exam : The patient is awake and alert, oriented to place and time.   Memory subjective described as intact.  Attention span & concentration ability appears normal.  Speech is fluent,  without  dysarthria, dysphonia or aphasia.  Mood and affect are appropriate.   Cranial nerves: no loss of smell or taste reported  Pupils are equal and briskly reactive to light. Funduscopic exam deferred. No diplopia.  Trouble reading screens.   Extraocular movements in vertical and horizontal planes were intact and without nystagmus. No Diplopia. Visual fields by finger perimetry are intact. Hearing was intact to soft  voice and finger rubbing.    Facial sensation intact to fine touch.  Facial motor strength is symmetric and tongue and uvula move midline.  Neck ROM : rotation, tilt and flexion extension were normal for age and shoulder shrug was symmetrical.     Motor exam:  Symmetric bulk, tone and ROM.   Normal tone without cog wheeling, symmetric grip strength .   Sensory:  Fine touch and vibration were tested  and  normal.  Proprioception tested in the  upper extremities was normal.   Coordination: Rapid alternating movements in the fingers/hands were of normal speed.  The Finger-to-nose maneuver was intact without evidence of ataxia, dysmetria or tremor. No pronator drift.    Gait and station: Patient could rise unassisted from a seated position,  walked without assistive device.  Stance is of normal width/ base and the patient turned with steps.  Toe and heel walk were deferred.  Romberg negative. Not swaying.  Deep tendon reflexes: in the  upper and lower extremities are symmetric and intact.  Babinski response was deferred.      After spending a total time of 45  minutes face to face and additional time for physical and neurologic examination, review of laboratory studies,  personal review of imaging studies, reports and results of other testing and review of referral information / records as far as provided in visit, I have established the following assessments:   Mrs. Becerril has been on Copaxone in form of subcutaneous every other day use. She stopped during pregnancy.  She had her last visit with Dr. Shelia Media about 1 year ago- and has not resumed medication yet- While nursing.  She is interested in Tecfedera po  Until she heard about the side effect of flushing. May be an iv drug- she has now had a tubal ligation and could certainly do that. Marland Kitchen    1) patient will stop nursing next Summer, and that will be the time to discuss new medication, considering Tysabri, Tecfidera , Ocrevus.    2) fatigue_ no need for modafinil as long as nursing. Tried ritalin, tried caffeine.      My Plan is to proceed with a Rv in June 2021.  Earlier if needed.   I would like to thank Saguier, Iris Pert and SaguierIris Pert [address] [place],  [place] [address] for allowing me to meet with and to take care of this pleasant patient.   Electronically signed by: Larey Seat, MD 04/27/2019 2:11 PM  [facility] [place] certified by The [employer] of Sleep Medicine and Diplomate of the [employer] of Sleep Medicine. Board certified In Neurology through the [place], Fellow of the [employer] of Neurology. Medical Director of [employer].

## 2019-04-27 NOTE — Patient Instructions (Signed)
Multiple Sclerosis  Multiple sclerosis (MS) is a disease of the brain, spinal cord, and optic nerves (central nervous system). It causes the body's disease-fighting (immune) system to destroy the protective covering (myelin sheath) around nerves in the brain. When this happens, signals (nerve impulses) going to and from the brain and spinal cord do not get sent properly or may not get sent at all.  There are several types of MS:  · Relapsing-remitting MS. This is the most common type. This causes sudden attacks of symptoms. After an attack, you may recover completely until the next attack, or some symptoms may remain permanently.  · Secondary progressive MS. This usually develops after the onset of relapsing-remitting MS. Similar to relapsing-remitting MS, this type also causes sudden attacks of symptoms. Attacks may be less frequent, but symptoms slowly get worse (progress) over time.  · Primary progressive MS. This causes symptoms that steadily progress over time. This type of MS does not cause sudden attacks of symptoms.  The age of onset of MS varies, but it often develops between 20-40 years of age. MS is a lifelong (chronic) condition. There is no cure, but treatment can help slow down the progression of the disease.  What are the causes?  The cause of this condition is not known.  What increases the risk?  You are more likely to develop this condition if:  · You are a woman.  · You have a relative with MS. However, the condition is not passed from parent to child (inherited).  · You have a lack (deficiency) of vitamin D.  · You smoke.  MS is more common in the [place] than in the [place].  What are the signs or symptoms?  Relapsing-remitting and secondary progressive MS cause symptoms to occur in episodes or attacks that may last weeks to months. There may be long periods between attacks in which there are almost no symptoms. Primary progressive MS causes symptoms to steadily  progress after they develop.  Symptoms of MS vary because of the many different ways it affects the central nervous system. The main symptoms include:  · Vision problems and eye pain.  · Numbness.  · Weakness.  · Inability to move your arms, hands, feet, or legs (paralysis).  · Balance problems.  · Shaking that you cannot control (tremors).  · Muscle spasms.  · Problems with thinking (cognitive changes).  MS can also cause symptoms that are associated with the disease, but are not always the direct result of an MS attack. They may include:  · Inability to control urination or bowel movements (incontinence).  · Headaches.  · Fatigue.  · Inability to tolerate heat.  · Emotional changes.  · Depression.  · Pain.  How is this diagnosed?  This condition is diagnosed based on:  · Your symptoms.  · A neurological exam. This involves checking central nervous system function, such as nerve function, reflexes, and coordination.  · MRIs of the brain and spinal cord.  · Lab tests, including a lumbar puncture that tests the fluid that surrounds the brain and spinal cord (cerebrospinal fluid).  · Tests to measure the electrical activity of the brain in response to stimulation (evoked potentials).  How is this treated?  There is no cure for MS, but medicines can help decrease the number and frequency of attacks and help relieve nuisance symptoms. Treatment options may include:  · Medicines that reduce the frequency of attacks. These medicines may be given by   you move around (assistive devices), such as braces, a cane, or a walker.  Physical therapy to strengthen and stretch your muscles.  Occupational therapy to help you with everyday tasks.   Alternative or complementary treatments such as exercise, massage, or acupuncture. Follow these instructions at home:  Take over-the-counter and prescription medicines only as told by your health care provider.  Do not drive or use heavy machinery while taking prescription pain medicine.  Use assistive devices as recommended by your physical therapist or your health care provider.  Exercise as directed by your health care provider.  Return to your normal activities as told by your health care provider. Ask your health care provider what activities are safe for you.  Reach out for support. Share your feelings with friends, family, or a support group.  Keep all follow-up visits as told by your health care provider and therapists. This is important. Where to find more information  National Multiple Sclerosis Society: https://www.nationalmssociety.org Contact a health care provider if:  You feel depressed.  You develop new pain or numbness.  You have tremors.  You have problems with sexual function. Get help right away if:  You develop paralysis.  You develop numbness.  You have problems with your bladder or bowel function.  You develop double vision.  You lose vision in one or both eyes.  You develop suicidal thoughts.  You develop severe confusion. If you ever feel like you may hurt yourself or others, or have thoughts about taking your own life, get help right away. You can go to your nearest emergency department or call:  Your local emergency services (911 in the [place]  A suicide crisis helpline, such as the [place] at (407)700-6435. This is open 24 hours a day. Summary  Multiple sclerosis (MS) is a disease of the central nervous system that causes the body's immune system to destroy the protective covering (myelin sheath) around nerves in the brain.  There are 3 types of MS: relapsing-remitting, secondary progressive, and primary  progressive. Relapsing-remitting and secondary progressive MS cause symptoms to occur in episodes or attacks that may last weeks to months. Primary progressive MS causes symptoms to steadily progress after they develop.  There is no cure for MS, but medicines can help decrease the number and frequency of attacks and help relieve nuisance symptoms. Treatment may also include physical or occupational therapy.  If you develop numbness, paralysis, vision problems, or other neurological symptoms, get help right away. This information is not intended to replace advice given to you by your health care provider. Make sure you discuss any questions you have with your health care provider. Document Released: 08/10/2000 Document Revised: 07/26/2017 Document Reviewed: 10/22/2016 Elsevier Patient Education  2020 [employer].

## 2019-04-29 ENCOUNTER — Encounter: Payer: Self-pay | Admitting: Cardiology

## 2019-04-30 ENCOUNTER — Encounter: Payer: Self-pay | Admitting: Cardiology

## 2019-04-30 ENCOUNTER — Ambulatory Visit (INDEPENDENT_AMBULATORY_CARE_PROVIDER_SITE_OTHER): Payer: No Typology Code available for payment source | Admitting: Cardiology

## 2019-04-30 ENCOUNTER — Other Ambulatory Visit: Payer: Self-pay

## 2019-04-30 VITALS — BP 120/81 | HR 96 | Ht 70.0 in | Wt 173.0 lb

## 2019-04-30 DIAGNOSIS — O1403 Mild to moderate pre-eclampsia, third trimester: Secondary | ICD-10-CM

## 2019-04-30 DIAGNOSIS — R030 Elevated blood-pressure reading, without diagnosis of hypertension: Secondary | ICD-10-CM

## 2019-04-30 DIAGNOSIS — R002 Palpitations: Secondary | ICD-10-CM

## 2019-04-30 DIAGNOSIS — R0609 Other forms of dyspnea: Secondary | ICD-10-CM

## 2019-04-30 DIAGNOSIS — R0600 Dyspnea, unspecified: Secondary | ICD-10-CM

## 2019-04-30 NOTE — Progress Notes (Signed)
Primary Physician/Referring:  Mackie Pai, PA-C  Patient ID: Karen Powers, female    DOB: 1981/08/06, 38 y.o.   MRN: [id number]  Chief Complaint  Patient presents with  . Irregular Heart Beat  . Hypertension  . Shortness of Breath   HPI:    Karen Powers  is a 38 y.o. Caucasian female with history of multiple sclerosis diagnosed in 2009, recent cesarean section on 02/25/2019 and she delivered a healthy baby boy, mother of 4 children, found to have elevated blood pressure postpartum and mild preeclampsia, diagnosis of inappropriate sinus tachycardia and also episodes of PSVT, undergone EP evaluation by Dr. Virl Axe on 02/04/2004 and there was no inducible arrhythmias although she had documented brief episodes of PSVT by monitoring.  EP study failed to identify any arrhythmia mechanism.  I have known the patient as Karen Powers, she has also had tilt table test on 02/10/2004 and was diagnosed with postural orthostatic tachycardia syndrome and recommended salt and water repletion and to consider beta-blocker therapy.  Patient developed hypertension, usually her systolic blood pressure is 100 mmHg, one week prior to her planned C-section, developed hypertension and mild PET.  She was conservatively treated but continued to have marked elevation in blood pressure, blood pressure recorded at 140/110 mmHg, she is now on Procardia XL 30 mg daily.  She is tolerating this well, states that her blood pressure is now improved.  She is also noticed mild shortness of breath.  No PND or orthopnea.  No leg edema.  Fortunately over the past few years, except for occasional palpitations, she has not had any other rapid palpitations, dizziness or hypotension or inappropriate sinus tachycardia like syndrome.  Past Medical History:  Diagnosis Date  . Acute blood loss anemia 01/06/2016  . Anemia   . Diabetes mellitus without complication ([place]) 123456   Gestational diabetes only.  Marland Kitchen Dysrhythmia    . Gestational diabetes    2nd preg only  . Headache(784.0)    otc med prn  . Heartburn in pregnancy   . Maternal iron deficiency anemia 01/06/2016  . MS (multiple sclerosis) ([place])   . Postpartum care following cesarean delivery (5/11) 01/05/2016  . Postpartum hypertension 03/07/2019  . POTS (postural orthostatic tachycardia syndrome)   . SVT (supraventricular tachycardia) ([place])    No problems since 10/2012 - no med needed  . Vaginal Pap smear, abnormal   . Vitamin deficiency    Past Surgical History:  Procedure Laterality Date  . ATRIAL ABLATION SURGERY  2003   FAILED  . CESAREAN SECTION  2006   [place]  . CESAREAN SECTION  2006   breech  . CESAREAN SECTION N/A 12/19/2013   Procedure: REPEAT CESAREAN SECTION;  Surgeon: Lovenia Kim, MD;  Location: [facility];  Service: Obstetrics;  Laterality: N/A;  EDD: 12/25/13  . CESAREAN SECTION N/A 01/05/2016   Procedure: Repeat CESAREAN SECTION;  Surgeon: Brien Few, MD;  Location: [facility];  Service: Obstetrics;  Laterality: N/A;  EDD: 01/12/16   . CESAREAN SECTION WITH BILATERAL TUBAL LIGATION Bilateral 02/25/2019   Procedure: Repeat CESAREAN SECTION WITH BILATERAL TUBAL LIGATION;  Surgeon: Brien Few, MD;  Location: [facility];  Service: Obstetrics;  Laterality: Bilateral;  Heather, RNFA  EDD: 03/04/19 Allergy: Adhesive, Paxil, Cipro  . COLPOSCOPY    . WISDOM TOOTH EXTRACTION     Social History   Socioeconomic History  . Marital status: Married    Spouse name: Zinna Saulsbury  . Number of children: 4  .  Years of education: Not on file  . Highest education level: Not on file  Occupational History  . 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 Financial resource strain: Not on file  . Food insecurity    Worry: Not on file    Inability: Not on file  . Transportation needs    Medical: Not on file    Non-medical: Not on file  Tobacco Use  . Smoking status: Never Smoker  . Smokeless tobacco: Never Used  Substance  and Sexual Activity  . Alcohol use: No  . Drug use: No  . Sexual activity: Yes    Birth control/protection: Pill    Comment: pregnant  Lifestyle  . Physical activity    Days per week: Not on file    Minutes per session: Not on file  . Stress: Not on file  Relationships  . Social Herbalist on phone: Not on file    Gets together: Not on file    Attends religious service: Not on file    Active member of club or organization: Not on file    Attends meetings of clubs or organizations: Not on file    Relationship status: Not on file  . Intimate partner violence    Fear of current or ex partner: Not on file    Emotionally abused: Not on file    Physically abused: Not on file    Forced sexual activity: Not on file  Other Topics Concern  . Not on file  Social History Narrative   [place] daily   Right handed    Lives with husband and 4 kids   She is ER nurse       ROS  Review of Systems  Constitution: Negative for chills, decreased appetite, malaise/fatigue and weight gain.  Cardiovascular: Positive for palpitations. Negative for dyspnea on exertion, leg swelling and syncope.  Endocrine: Negative for cold intolerance.  Hematologic/Lymphatic: Does not bruise/bleed easily.  Musculoskeletal: Negative for joint swelling.  Gastrointestinal: Negative for abdominal pain, anorexia, change in bowel habit, hematochezia and melena.  Neurological: Negative for headaches and light-headedness.  Psychiatric/Behavioral: Negative for depression and substance abuse.  All other systems reviewed and are negative.  Objective  Blood pressure 120/81, pulse 96, height 5\' 10"  (1.778 m), weight 173 lb (78.5 kg), SpO2 98 %, currently breastfeeding. Body mass index is 24.82 kg/m.   Physical Exam  Constitutional: She appears well-developed and well-nourished. No distress.  HENT:  Head: Atraumatic.  Eyes: Conjunctivae are normal.  Neck: Neck supple. No JVD present. No thyromegaly present.   Cardiovascular: Normal rate, regular rhythm, normal heart sounds and intact distal pulses. Exam reveals no gallop.  No murmur heard. Pulmonary/Chest: Effort normal and breath sounds normal.  Abdominal: Soft. Bowel sounds are normal.  Musculoskeletal: Normal range of motion.  Neurological: She is alert.  Skin: Skin is warm and dry.  Psychiatric: She has a normal mood and affect.   Radiology: No results found.  Laboratory examination:   Recent Labs    02/21/19 0720 03/07/19 0117  NA 135 138  K 3.9 3.7  CL 103 108  CO2 20* 21*  GLUCOSE 101* 97  BUN 8 13  CREATININE 0.55 0.64  CALCIUM 8.6* 8.7*  GFRNONAA >60 >60  GFRAA >60 >60   CMP Latest Ref Rng & Units 03/07/2019 02/21/2019 10/23/2017  Glucose 70 - 99 mg/dL 97 101(H) 185(H)  BUN 6 - 20 mg/dL 13 8 7   Creatinine 0.44 - 1.00  mg/dL 0.64 0.55 0.72  Sodium 135 - 145 mmol/L 138 135 136  Potassium 3.5 - 5.1 mmol/L 3.7 3.9 4.0  Chloride 98 - 111 mmol/L 108 103 106  CO2 22 - 32 mmol/L 21(L) 20(L) 20(L)  Calcium 8.9 - 10.3 mg/dL 8.7(L) 8.6(L) 8.9  Total Protein 6.5 - 8.1 g/dL 5.7(L) 5.8(L) -  Total Bilirubin 0.3 - 1.2 mg/dL 0.5 0.5 -  Alkaline Phos 38 - 126 U/L 84 122 -  AST 15 - 41 U/L 18 19 -  ALT 0 - 44 U/L 20 12 -   CBC Latest Ref Rng & Units 03/07/2019 02/26/2019 02/25/2019  WBC 4.0 - 10.5 K/uL 6.1 9.1 11.6(H)  Hemoglobin 12.0 - 15.0 g/dL 9.5(L) 8.7(L) 11.4(L)  Hematocrit 36.0 - 46.0 % 29.5(L) 26.6(L) 34.4(L)  Platelets 150 - 400 K/uL 221 154 202   Lipid Panel     Component Value Date/Time   CHOL 167 12/05/2016 1021   TRIG 40.0 12/05/2016 1021   HDL 60.00 12/05/2016 1021   CHOLHDL 3 12/05/2016 1021   VLDL 8.0 12/05/2016 1021   LDLCALC 99 12/05/2016 1021   HEMOGLOBIN A1C No results found for: HGBA1C, MPG TSH No results for input(s): TSH in the last 8760 hours. Medications   Prior to Admission medications   Medication Sig Start Date End Date Taking? Authorizing Provider  acetaminophen (TYLENOL) 500 MG tablet Take  1,000 mg by mouth every 6 (six) hours as needed for moderate pain or headache.     [provider]  NIFEdipine (ADALAT CC) 30 MG 24 hr tablet Take 1 tablet (30 mg total) by mouth daily. 03/09/19   Brien Few, MD     Current Outpatient Medications  Medication Instructions  . acetaminophen (TYLENOL) 1,000 mg, Oral, Every 6 hours PRN  . NIFEdipine (ADALAT CC) 30 mg, Oral, Daily    Cardiac Studies:   Tilt table test  02/11/2004  POSTOPERATIVE DIAGNOSES:  1. Syncope.  2. Tachypalpitations.   POSTOPERATIVE DIAGNOSIS:  Postural orthostatic tachycardia.   Following equilibration in the supine position with blood pressures about  110 with a pulse of about 75, the patient was subjected to head-up tilt  table testing.  After about 10 to 12 minutes, her heart rate had escalated  to the 120 range without significant change in blood pressure.  The  patient's heart rate then gradually returned to normal.  These symptoms  associated with the tachycardia reproduced her clinical symptoms.   IMPRESSION:  Postural orthostatic tachycardia.   RECOMMENDATIONS:  Salt and water repletion and consideration of beta  blockade. Olin Pia.   EP study 02/04/2004  1. Arrhythmias were induced, appeared to have quite limited to 3-4 beats and     appeared to have an origin in this sinus node area.  This was     demonstrated by both electrogram mapping as well as endocardial array     mapping.  2. Inappropriate sinus tachycardias.  3. Normal atrial function without inducible arrhythmia.  4. Normal AV nodal function without dual AV nodal physiology.  5. No accessory pathways.  Assessment     ICD-10-CM   1. Elevated BP without diagnosis of hypertension  R03.0 PCV ECHOCARDIOGRAM COMPLETE  2. Mild pre-eclampsia in third trimester  O14.03   3. Dyspnea on exertion  R06.09 PCV ECHOCARDIOGRAM COMPLETE  4. Palpitations  R00.2 EKG 12-Lead    EKG 04/30/2019: Normal sinus rhythm at rate of 90 bpm,  normal axis.  No evidence of ischemia, normal EKG.  Recommendations:  Patient referred to me for evaluation of elevated blood pressure, dyspnea and palpitations, patient previously has been diagnosed with POTS (postural orthostatic tachycardia syndrome) and also IST (inappropriate sinus tachycardia), fortunately was the past few years has not had any episodes of rapid palpitations or dizziness.  In view of dyspnea, elevated blood pressure, we will obtain an echocardiogram.  I suspect her hypotension is probably related to weight gain, she has gained in excess of 30 pounds during pregnancy.  I do not see any contraindication for her to start exercising, no clinical evidence of heart failure, blood pressure is very well controlled on Procardia and she is tolerating this well.  Post surgical/post partum anemia may also be contributing some to her systolic hypertension.  Unless echocardiogram is abnormal I'll see her back on a p.r.n. basis.  Patient is well-known to me, I known her previously as Karen Powers.  Recheck with me if she has any other cardiac issues.   Adrian Prows, MD, [facility] 04/30/2019, 2:19 PM [facility] [place] Pager: [phone] Office: [phone] If no answer Cell [phone]

## 2019-05-13 MED FILL — NIFEdipine ER 30 MG TB24: 30 | 30 days supply | Qty: 30 | Fill #1

## 2019-05-19 ENCOUNTER — Other Ambulatory Visit: Payer: Self-pay

## 2019-05-19 ENCOUNTER — Ambulatory Visit (INDEPENDENT_AMBULATORY_CARE_PROVIDER_SITE_OTHER): Payer: No Typology Code available for payment source

## 2019-05-19 DIAGNOSIS — R030 Elevated blood-pressure reading, without diagnosis of hypertension: Secondary | ICD-10-CM | POA: Diagnosis not present

## 2019-05-19 DIAGNOSIS — R0609 Other forms of dyspnea: Secondary | ICD-10-CM | POA: Diagnosis not present

## 2019-05-19 DIAGNOSIS — R0600 Dyspnea, unspecified: Secondary | ICD-10-CM

## 2019-05-25 ENCOUNTER — Encounter: Payer: Self-pay | Admitting: Medical

## 2019-05-25 IMAGING — MR MR CERVICAL SPINE WO/W CM
15 of 25 series · 21 of 48 positions shown · IV contrast (Yes   MULTIHANCE)
Comparison: MRI of the brain and cervical spine 07/13/2017.

CLINICAL DATA: Multiple sclerosis. Dizziness and near-syncope when
turning head. New neurologic event.

EXAM:
MRI HEAD WITHOUT AND WITH CONTRAST
MRI CERVICAL SPINE WITHOUT AND WITH CONTRAST
TECHNIQUE: Multiplanar, multiecho pulse sequences of the brain and surrounding
structures, and cervical spine, to include the craniocervical
junction and cervicothoracic junction, were obtained without and
with intravenous contrast.
CONTRAST:  15mL MULTIHANCE GADOBENATE DIMEGLUMINE 529 MG/ML IV SOLN

[Series 3: DWI · axial · 3.0mm · 1.09mm/px · z∈[-52,+89]mm · 3 of 98 slices shown (1 of 2)]
[im 1/98]
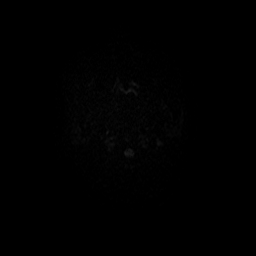
[im 49/98]
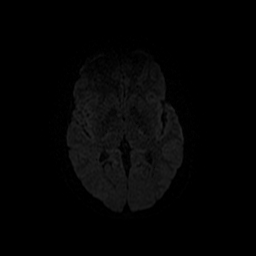
[im 98/98]
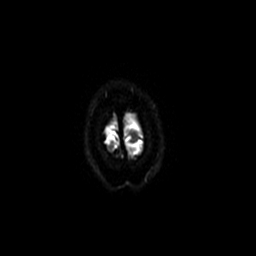

[Series 4: DWI · coronal · 5.0mm · 1.09mm/px · 2 of 68 slices shown (2 of 2)]
[im 1/68]
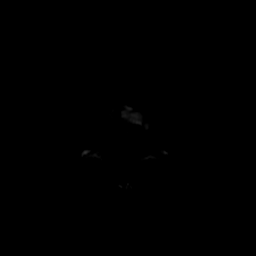
[im 68/68]
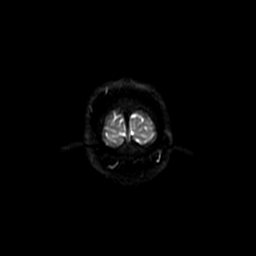

[Series 5: T1 · sagittal · 5.0mm · 0.47mm/px · 1 of 23 slices shown (1 of 3)]
[im 1/23]
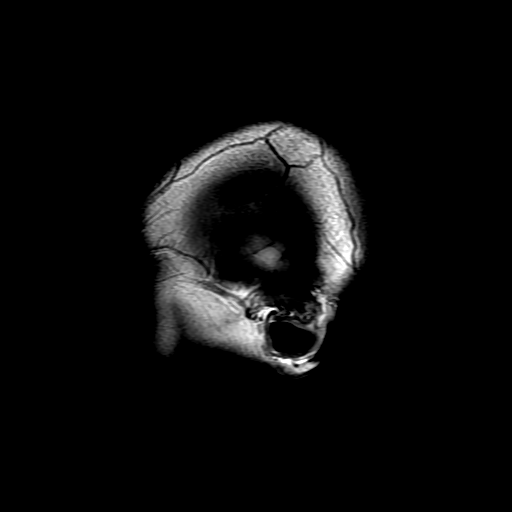

[Series 6: T2 · axial · 5.0mm · 0.43mm/px · 1 of 26 slices shown (1 of 4)]
[im 1/26]
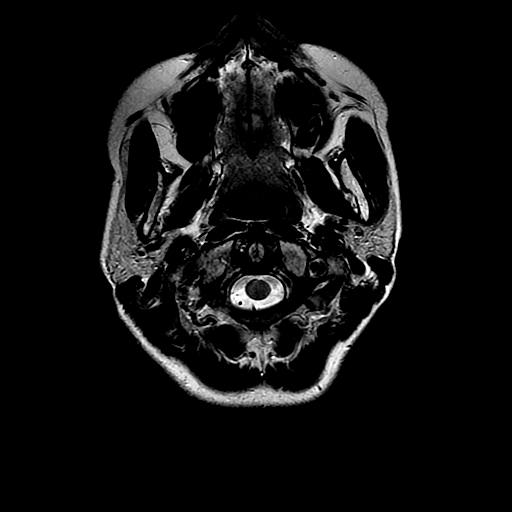

[Series 7: FLAIR · axial · 5.0mm · 0.43mm/px · 1 of 26 slices shown]
[im 1/26]
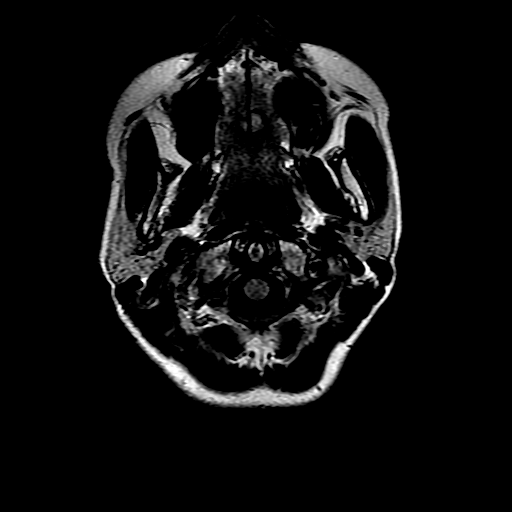

[Series 8: ax mpgr · axial · 5.0mm · 0.43mm/px · 1 of 26 slices shown]
[im 1/26]
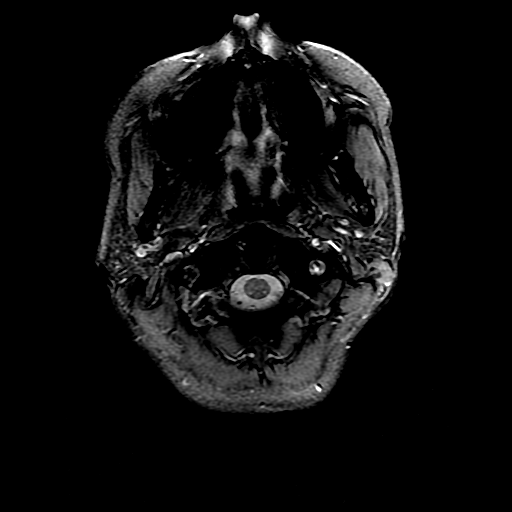

[Series 10: T2 · coronal · 5.0mm · 0.43mm/px · 1 of 28 slices shown (2 of 4)]
[im 1/28]
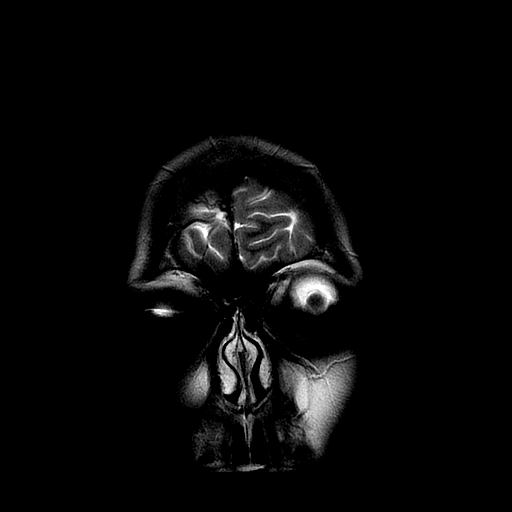

[Series 13: T2 · sagittal · 3.0mm · 0.43mm/px · 1 of 15 slices shown (3 of 4)]
[im 1/15]
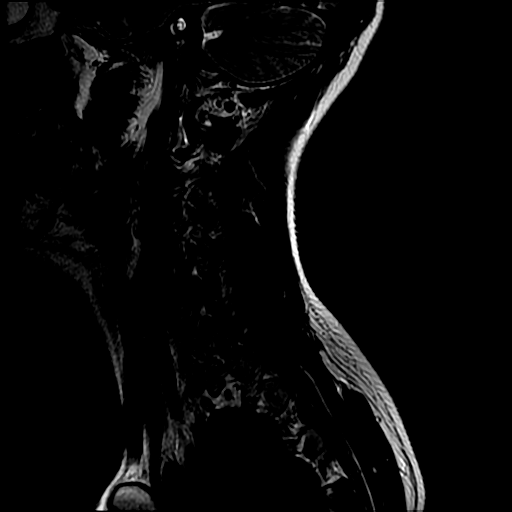

[Series 14: T1 · sagittal · 3.0mm · 0.43mm/px · 1 of 15 slices shown (2 of 3)]
[im 1/15]
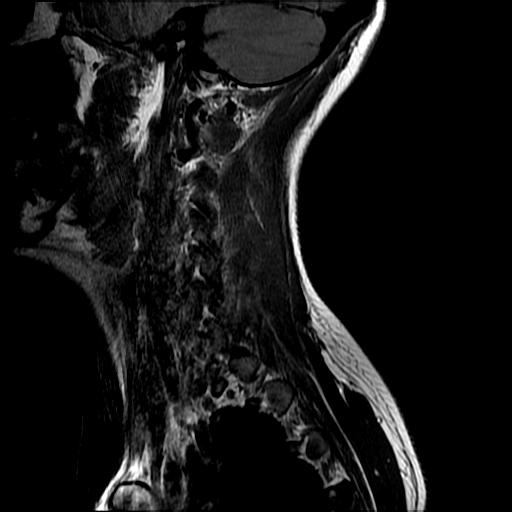

[Series 17: T2 · axial · 3.0mm · 0.39mm/px · z∈[-185,-57]mm · 2 of 39 slices shown (4 of 4)]
[im 1/39]
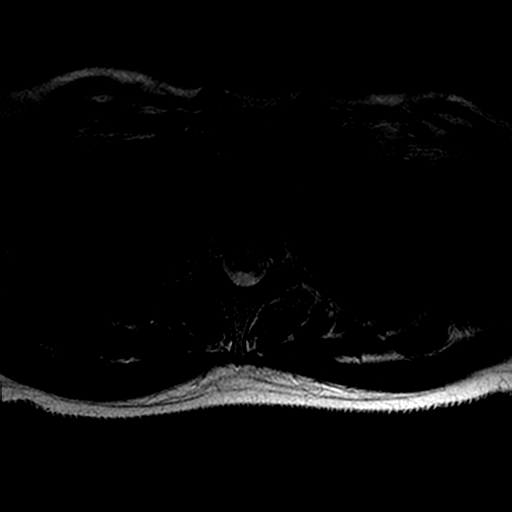
[im 39/39]
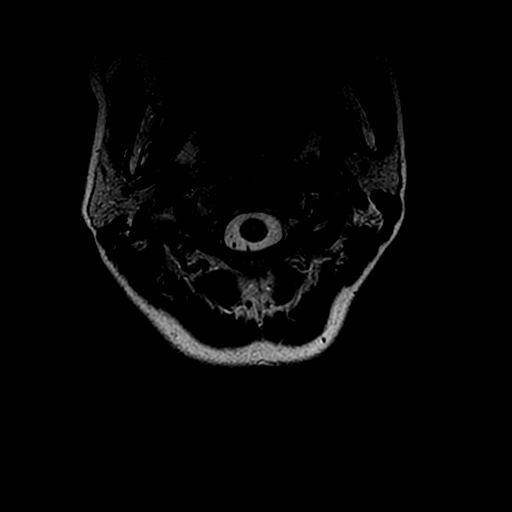

[Series 18: T1 · axial · non-contrast · 3.0mm · 0.39mm/px · z∈[-185,-57]mm · 2 of 39 slices shown (3 of 3)]
[im 1/39]
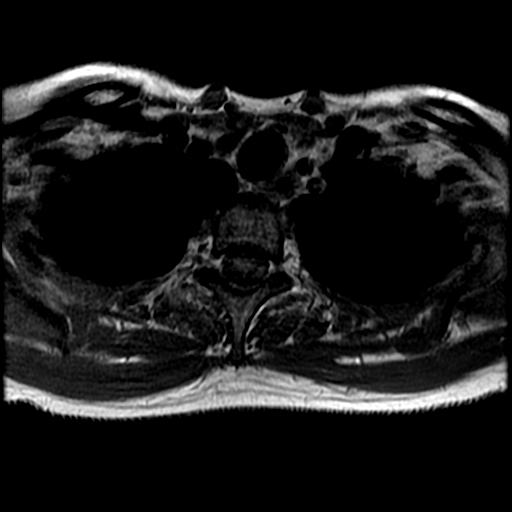
[im 39/39]
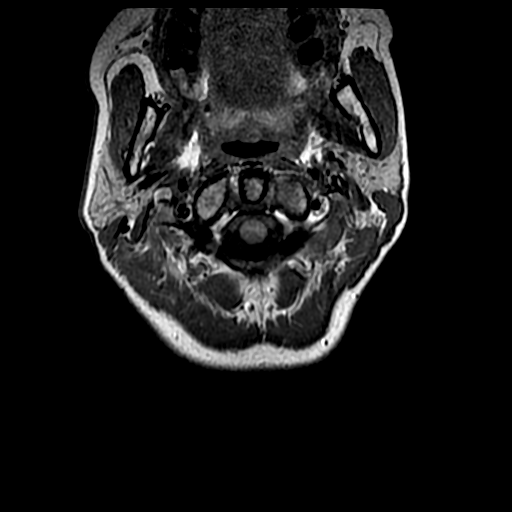

[Series 20: T1 post-contrast · axial · 3.0mm · 0.39mm/px · z∈[-185,-57]mm · 2 of 39 slices shown (1 of 3)]
[im 1/39]
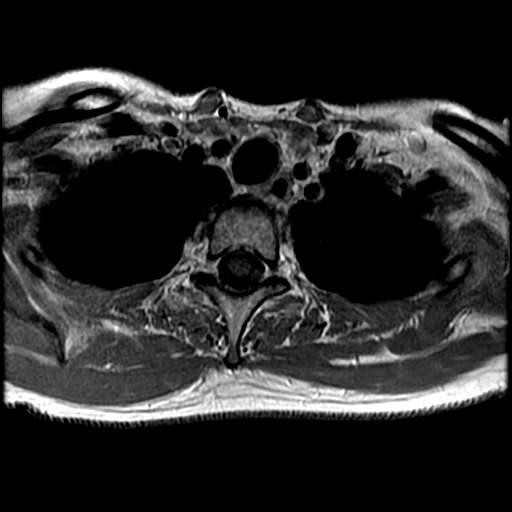
[im 39/39]
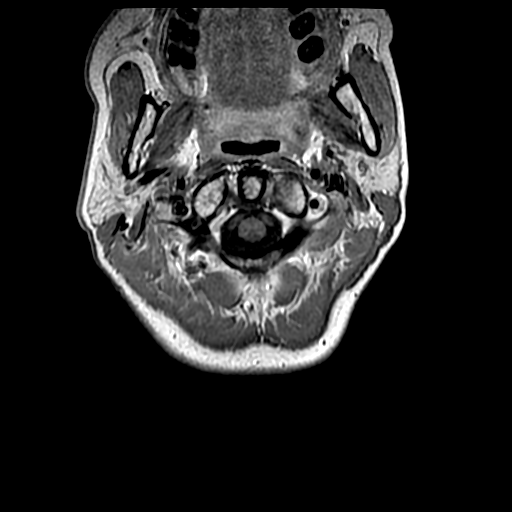

[Series 21: T1 fat-sat post-contrast · sagittal · 3.0mm · 0.43mm/px · 1 of 15 slices shown]
[im 1/15]
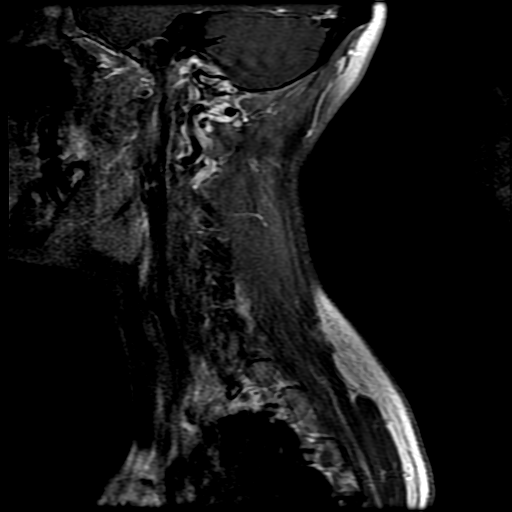

[Series 23: T1 post-contrast · coronal · 5.0mm · 0.43mm/px · 1 of 28 slices shown (2 of 3)]
[im 1/28]
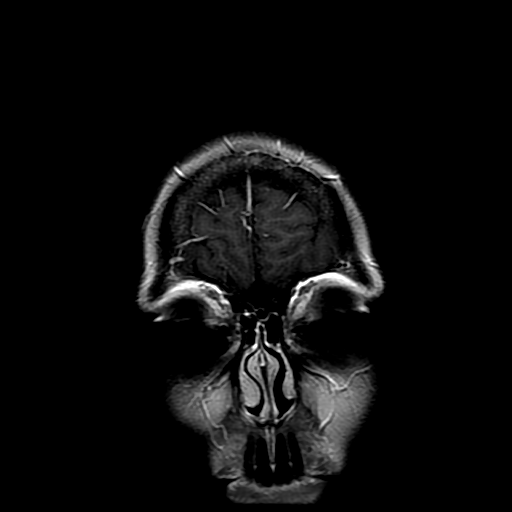

[Series 24: T1 post-contrast · sagittal · 5.0mm · 0.47mm/px · 1 of 23 slices shown (3 of 3)]
[im 1/23]
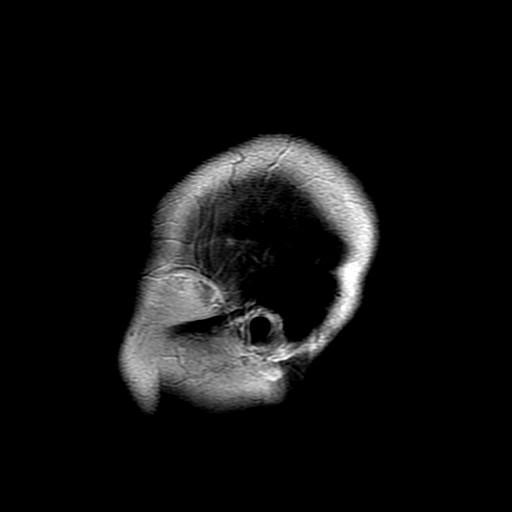

[21 of 48 positions shown; findings below may reference images not displayed]

FINDINGS: MRI HEAD FINDINGS

Brain: Periventricular T2 signal changes are stable from the prior
study. There is some involvement of the corpus callosum. No new
lesions are present. There is no restricted diffusion or
enhancement.

No acute infarct, hemorrhage, or mass lesion is present. The
ventricles are of normal size. No significant extra-axial fluid
collection is present.

Postcontrast images demonstrate no pathologic enhancement.

Vascular: Flow is present in the major intracranial arteries.

Skull and upper cervical spine: Skull base is normal. Craniocervical
junction is normal. Marrow signal is unremarkable.

Sinuses/Orbits: The paranasal sinuses and mastoid air cells are
clear. Globes and orbits are within normal limits.

MRI CERVICAL SPINE FINDINGS

Alignment: AP alignment is anatomic.

Vertebrae: Marrow signal and vertebral body heights are normal.

Cord: New T2 signal changes are present in the left hemi cord at the
C2 level. This extends 2 cm cephalo caudad is noted laterally and
anteriorly. There is no associated enhancement.

A smaller 11 mm cephalo caudad area of T2 signal changes present in
the far right lateral hemi cord at the C2-3 level.

No other focal cord signal changes are present to the lowest imaged
level T2-3.

Posterior Fossa, vertebral arteries, paraspinal tissues: The
craniocervical junction is normal. Flow is present in the vertebral
arteries bilaterally.

Disc levels:

A mild leftward disc osteophyte complex is again noted at C5-6. No
significant stenosis is present.

No other significant focal disc disease is present.
IMPRESSION: 1. New T2 signal changes in the left hemicord at C2 extending 2 cm
cephalo caudad.
2. Smaller new area of T2 signal change in the far right lateral
hemi cord at C2-3 measuring 11 mm cephalo caudad. These lesions
likely represent new areas of demyelination within the spinal cord.
3. Intracranial periventricular T2 signal changes are stable.
4. No acute intracranial abnormality.

## 2019-06-19 ENCOUNTER — Encounter: Payer: Self-pay | Admitting: Medical

## 2019-06-19 ENCOUNTER — Other Ambulatory Visit: Payer: Self-pay

## 2019-06-19 ENCOUNTER — Telehealth (INDEPENDENT_AMBULATORY_CARE_PROVIDER_SITE_OTHER): Payer: No Typology Code available for payment source | Admitting: Medical

## 2019-06-19 ENCOUNTER — Ambulatory Visit: Payer: No Typology Code available for payment source | Admitting: Medical

## 2019-06-19 VITALS — BP 158/102

## 2019-06-19 DIAGNOSIS — I10 Essential (primary) hypertension: Secondary | ICD-10-CM | POA: Diagnosis not present

## 2019-06-19 MED ORDER — NIFEDIPINE ER OSMOTIC RELEASE 60 MG PO TB24: 60.0000 mg | ORAL_TABLET | Freq: Every day | ORAL | 1 refills | Status: DC

## 2019-06-19 MED FILL — NIFEDIPINE ER OSMOTIC RELEA: 60 | 30 days supply | Qty: 30 | Fill #0

## 2019-06-19 NOTE — Progress Notes (Signed)
Virtual Visit via Video Note  I connected with Karen Powers on 06/19/19 at  3:40 PM EDT by a video enabled telemedicine application and verified that I am speaking with the correct person using two identifiers.  Location: Patient: home Provider: office   I discussed the limitations of evaluation and management by telemedicine and the availability of in person appointments. The patient expressed understanding and agreed to proceed.  History of Present Illness:  Pt bp today 158/102. Pt bp came up after delivery of her last baby was born in July 1st. Pt has 4 children. Consistently most days her bp is in 150/90 range or little higher.  One day she forgot to check bp and had normal range bp measurement. But then spiked up to [id number] systolic next day.  No ha, no dizziness, no blurred vition or any gross motor sensory function deficits.   Cardio did echo recently. Was normal per pt report  Pt bp is consistently. Like this or little lower. Pt weight is 168. Before got pregnant was 155. She usually runs around 140 lb.  Pt pulse was 82 recently.   Pt is nursing her child  presently.      Observations/Objective: General-no acute distress, pleasant, oriented. Lungs- on inspection lungs appear unlabored. Neck- no tracheal deviation or jvd on inspection. Neuro- gross motor function appears intact.  Assessment and Plan: Your bp is a bit too high on daily basis. Not coming down as hoped by your specialist. I think best to increase current dose nifedipine to 60 mg daily. Keep checking bp and pulse daily. Would like my chart update in 3 weeks or as needed.  Can see you sooner if bp not improving or as neeed  Follow Up Instructions:    I discussed the assessment and treatment plan with the patient. The patient was provided an opportunity to ask questions and all were answered. The patient agreed with the plan and demonstrated an understanding of the instructions.   The patient was advised  to call back or seek an in-person evaluation if the symptoms worsen or if the condition fails to improve as anticipated.  I provided 25  minutes of non-face-to-face time during this encounter.   Mackie Pai, PA-C

## 2019-06-19 NOTE — Patient Instructions (Signed)
Your bp is a bit too high on daily basis. Not coming down as hoped by your specialist. I think best to increase current dose nifedipine to 60 mg daily. Keep checking bp and pulse daily. Would like my chart update in 3 weeks or as needed.  Can see you sooner if bp not improving or as neeed

## 2019-07-20 MED FILL — NIFEDIPINE ER OSMOTIC RELEA: 60 | 30 days supply | Qty: 30 | Fill #1

## 2019-07-27 ENCOUNTER — Encounter: Payer: Self-pay | Admitting: Medical

## 2019-08-05 ENCOUNTER — Telehealth: Payer: No Typology Code available for payment source | Admitting: Family

## 2019-08-05 ENCOUNTER — Encounter: Payer: Self-pay | Admitting: Medical

## 2019-08-05 DIAGNOSIS — R399 Unspecified symptoms and signs involving the genitourinary system: Secondary | ICD-10-CM

## 2019-08-05 MED ORDER — CEPHALEXIN 500 MG PO CAPS: 500.0000 mg | ORAL_CAPSULE | Freq: Two times a day (BID) | ORAL | 0 refills | Status: DC

## 2019-08-05 MED FILL — CEPHALEXIN 500 MG CAPSULE: 500 | 7 days supply | Qty: 14 | Fill #0

## 2019-08-05 NOTE — Progress Notes (Signed)

## 2019-08-07 ENCOUNTER — Other Ambulatory Visit: Payer: Self-pay

## 2019-08-07 ENCOUNTER — Emergency Department (HOSPITAL_BASED_OUTPATIENT_CLINIC_OR_DEPARTMENT_OTHER)
Admission: EM | Admit: 2019-08-07 | Discharge: 2019-08-07 | Disposition: A | Discharge status: Home / Self Care | Payer: No Typology Code available for payment source | Attending: Emergency Medicine | Admitting: Emergency Medicine

## 2019-08-07 ENCOUNTER — Emergency Department (HOSPITAL_BASED_OUTPATIENT_CLINIC_OR_DEPARTMENT_OTHER): Payer: No Typology Code available for payment source

## 2019-08-07 ENCOUNTER — Encounter (HOSPITAL_BASED_OUTPATIENT_CLINIC_OR_DEPARTMENT_OTHER): Payer: Self-pay | Admitting: *Deleted

## 2019-08-07 DIAGNOSIS — I10 Essential (primary) hypertension: Secondary | ICD-10-CM | POA: Insufficient documentation

## 2019-08-07 DIAGNOSIS — R1011 Right upper quadrant pain: Secondary | ICD-10-CM | POA: Insufficient documentation

## 2019-08-07 DIAGNOSIS — Z79899 Other long term (current) drug therapy: Secondary | ICD-10-CM | POA: Insufficient documentation

## 2019-08-07 DIAGNOSIS — K8080 Other cholelithiasis without obstruction: Secondary | ICD-10-CM | POA: Insufficient documentation

## 2019-08-07 DIAGNOSIS — R1013 Epigastric pain: Secondary | ICD-10-CM | POA: Diagnosis not present

## 2019-08-07 DIAGNOSIS — K8020 Calculus of gallbladder without cholecystitis without obstruction: Secondary | ICD-10-CM

## 2019-08-07 LAB — CBC WITH DIFFERENTIAL/PLATELET
Abs Immature Granulocytes: 0.02 10*3/uL (ref 0.00–0.07)
Basophils Absolute: 0 10*3/uL (ref 0.0–0.1)
Basophils Relative: 0 %
Eosinophils Absolute: 0.2 10*3/uL (ref 0.0–0.5)
Eosinophils Relative: 2 %
HCT: 37.5 % (ref 36.0–46.0)
Hemoglobin: 12.9 g/dL (ref 12.0–15.0)
Immature Granulocytes: 0 %
Lymphocytes Relative: 22 %
Lymphs Abs: 2.2 10*3/uL (ref 0.7–4.0)
MCH: 30.9 pg (ref 26.0–34.0)
MCHC: 34.4 g/dL (ref 30.0–36.0)
MCV: 89.9 fL (ref 80.0–100.0)
Monocytes Absolute: 0.9 10*3/uL (ref 0.1–1.0)
Monocytes Relative: 9 %
Neutro Abs: 6.5 10*3/uL (ref 1.7–7.7)
Neutrophils Relative %: 67 %
Platelets: 223 10*3/uL (ref 150–400)
RBC: 4.17 MIL/uL (ref 3.87–5.11)
RDW: 12.6 % (ref 11.5–15.5)
WBC: 9.7 10*3/uL (ref 4.0–10.5)
nRBC: 0 % (ref 0.0–0.2)

## 2019-08-07 LAB — URINALYSIS, ROUTINE W REFLEX MICROSCOPIC
Bilirubin Urine: NEGATIVE
Glucose, UA: NEGATIVE mg/dL
Hgb urine dipstick: NEGATIVE
Ketones, ur: NEGATIVE mg/dL
Leukocytes,Ua: NEGATIVE
Nitrite: NEGATIVE
Protein, ur: NEGATIVE mg/dL
Specific Gravity, Urine: 1.02 (ref 1.005–1.030)
pH: 7 (ref 5.0–8.0)

## 2019-08-07 LAB — COMPREHENSIVE METABOLIC PANEL
ALT: 61 U/L — ABNORMAL HIGH (ref 0–44)
AST: 143 U/L — ABNORMAL HIGH (ref 15–41)
Albumin: 4.3 g/dL (ref 3.5–5.0)
Alkaline Phosphatase: 130 U/L — ABNORMAL HIGH (ref 38–126)
Anion gap: 8 (ref 5–15)
BUN: 7 mg/dL (ref 6–20)
CO2: 26 mmol/L (ref 22–32)
Calcium: 8.9 mg/dL (ref 8.9–10.3)
Chloride: 104 mmol/L (ref 98–111)
Creatinine, Ser: 0.69 mg/dL (ref 0.44–1.00)
GFR calc Af Amer: >60 mL/min (ref >60)
GFR calc non Af Amer: >60 mL/min (ref >60)
Glucose, Bld: 139 mg/dL — ABNORMAL HIGH (ref 70–99)
Potassium: 3.3 mmol/L — ABNORMAL LOW (ref 3.5–5.1)
Sodium: 138 mmol/L (ref 135–145)
Total Bilirubin: 1.1 mg/dL (ref 0.3–1.2)
Total Protein: 7.3 g/dL (ref 6.5–8.1)

## 2019-08-07 LAB — LIPASE, BLOOD: Lipase: 34 U/L (ref 11–51)

## 2019-08-07 LAB — PREGNANCY, URINE: Preg Test, Ur: NEGATIVE

## 2019-08-07 MED ORDER — SODIUM CHLORIDE 0.9 % IV BOLUS
1000.0000 mL | Freq: Once | INTRAVENOUS | Status: AC
  Administered 2019-08-07: 20:00:00 1000 mL via INTRAVENOUS

## 2019-08-07 NOTE — ED Provider Notes (Signed)
[facility] EMERGENCY DEPARTMENT Provider Note   CSN: [id number] Arrival date & time: 08/07/19  1910     History Chief Complaint  Patient presents with  . Abdominal Pain    Karen Powers is a 38 y.o. female.  HPI      3PM began having epigastric abdominal pain with some radiation to RUQ and back, sharp, cramping pain, 8/10 pain, could not get comfortable, trying to change positions, walk around, not improving  Has had intermittent pain since pregnancy in July, not always necessarily associated with eating and not this severe, today pain developed 1 hr after eating  Nausea, no vomiting, no diarrhea/constipation Began keflex yesterday for UTI, was having dysuria, urgency, frequency Took tylenol at 6PM, feeling flushed, maybe felt like having a fever start before that   Past Medical History:  Diagnosis Date  . Acute blood loss anemia 01/06/2016  . Anemia   . Diabetes mellitus without complication ([place]) 123456   Gestational diabetes only.  Marland Kitchen Dysrhythmia   . Gestational diabetes    2nd preg only  . Headache(784.0)    otc med prn  . Heartburn in pregnancy   . Maternal iron deficiency anemia 01/06/2016  . MS (multiple sclerosis) ([place])   . Postpartum care following cesarean delivery (5/11) 01/05/2016  . Postpartum hypertension 03/07/2019  . POTS (postural orthostatic tachycardia syndrome)   . SVT (supraventricular tachycardia) ([place])    No problems since 10/2012 - no med needed  . Vaginal Pap smear, abnormal   . Vitamin deficiency     Patient Active Problem List   Diagnosis Date Noted  . Postpartum hypertension 03/07/2019  . Maternal anemia, with delivery 02/27/2019  . Rh negative, maternal 02/27/2019  . Previous cesarean delivery affecting pregnancy 02/25/2019  . Multiple sclerosis exacerbation ([place]) 10/22/2017  . Acute hypokalemia 10/22/2017  . POTS (postural orthostatic tachycardia syndrome) 10/22/2017  . SVT (supraventricular tachycardia) ([place])  10/22/2017  . History of Gestational diabetes 10/22/2017  . Maternal iron deficiency anemia 01/06/2016  . Acute blood loss anemia 01/06/2016  . Postpartum care following cesarean delivery (7/1) 01/05/2016  . Wellness examination 09/28/2014  . Acute pharyngitis 09/08/2014  . Previous cesarean section 12/19/2013  . Multiple sclerosis ([place]) 09/07/2011  . Migraine 09/07/2011  . Paroxysmal SVT (supraventricular tachycardia) ([place]) 09/07/2011  . UTI (urinary tract infection) 09/07/2011    Past Surgical History:  Procedure Laterality Date  . ATRIAL ABLATION SURGERY  2003   FAILED  . CESAREAN SECTION  2006   Breech  . CESAREAN SECTION  2006   breech  . CESAREAN SECTION N/A 12/19/2013   Procedure: REPEAT CESAREAN SECTION;  Surgeon: Lovenia Kim, MD;  Location: [facility];  Service: Obstetrics;  Laterality: N/A;  EDD: 12/25/13  . CESAREAN SECTION N/A 01/05/2016   Procedure: Repeat CESAREAN SECTION;  Surgeon: Brien Few, MD;  Location: [facility];  Service: Obstetrics;  Laterality: N/A;  EDD: 01/12/16   . CESAREAN SECTION WITH BILATERAL TUBAL LIGATION Bilateral 02/25/2019   Procedure: Repeat CESAREAN SECTION WITH BILATERAL TUBAL LIGATION;  Surgeon: Brien Few, MD;  Location: [facility];  Service: Obstetrics;  Laterality: Bilateral;  Heather, RNFA  EDD: 03/04/19 Allergy: Adhesive, Paxil, Cipro  . COLPOSCOPY    . WISDOM TOOTH EXTRACTION       OB History    Gravida  6   Para  4   Term  4   Preterm      AB  2   Living  4  SAB  2   TAB      Ectopic      Multiple  0   Live Births  4           Family History  Problem Relation Age of Onset  . Multiple sclerosis Mother   . Migraines Mother   . Emphysema Father   . Heart disease Father   . Migraines Father   . Migraines Sister   . Spina bifida Brother        half brother  . Cancer Paternal Grandfather        LUNG  . Diabetes Maternal Grandmother     Social History   Tobacco Use  . Smoking status:  Never Smoker  . Smokeless tobacco: Never Used  Substance Use Topics  . Alcohol use: No  . Drug use: No    Home Medications Prior to Admission medications   Medication Sig Start Date End Date Taking? Authorizing Provider  acetaminophen (TYLENOL) 500 MG tablet Take 1,000 mg by mouth every 6 (six) hours as needed for moderate pain or headache.     [provider]  cephALEXin (KEFLEX) 500 MG capsule Take 1 capsule (500 mg total) by mouth 2 (two) times daily. 08/05/19   Sharion Balloon, FNP  NIFEdipine (PROCARDIA XL/NIFEDICAL XL) 60 MG 24 hr tablet Take 1 tablet (60 mg total) by mouth daily. 06/19/19   Saguier, Percell Miller, PA-C    Allergies    Ciprofloxacin  Review of Systems   Review of Systems  Constitutional: Negative for fever (possibly, began to feel warm and flushed prior to arrival but no known).  Eyes: Negative for visual disturbance.  Respiratory: Negative for cough and shortness of breath.   Cardiovascular: Negative for chest pain.  Gastrointestinal: Positive for abdominal pain and nausea. Negative for diarrhea and vomiting.  Genitourinary: Positive for dysuria. Negative for difficulty urinating.  Musculoskeletal: Positive for back pain. Negative for neck pain.  Skin: Negative for rash.  Neurological: Negative for syncope and headaches.    Physical Exam Updated Vital Signs BP (!) 133/95   Pulse 74   Temp 99.6 F (37.6 C) (Oral)   Resp 19   Ht 5\' 10"  (1.778 m)   Wt 76.2 kg   SpO2 100%   BMI 24.11 kg/m   Physical Exam Vitals and nursing note reviewed.  Constitutional:      General: She is not in acute distress.    Appearance: She is well-developed. She is not diaphoretic.  HENT:     Head: Normocephalic and atraumatic.  Eyes:     Conjunctiva/sclera: Conjunctivae normal.  Cardiovascular:     Rate and Rhythm: Normal rate and regular rhythm.  Pulmonary:     Effort: Pulmonary effort is normal. No respiratory distress.  Abdominal:     Palpations: Abdomen is  soft.     Tenderness: There is abdominal tenderness (epigastric, RUQ). There is no guarding. Negative signs include Murphy's sign.  Musculoskeletal:        General: No tenderness.     Cervical back: Normal range of motion.  Skin:    General: Skin is warm and dry.     Findings: No erythema or rash.  Neurological:     Mental Status: She is alert and oriented to person, place, and time.     ED Results / Procedures / Treatments   Labs (all labs ordered are listed, but only abnormal results are displayed) Labs Reviewed  COMPREHENSIVE METABOLIC PANEL - Abnormal; Notable for the  following components:      Result Value   Potassium 3.3 (*)    Glucose, Bld 139 (*)    AST 143 (*)    ALT 61 (*)    Alkaline Phosphatase 130 (*)    All other components within normal limits  URINE CULTURE  CBC WITH DIFFERENTIAL/PLATELET  LIPASE, BLOOD  URINALYSIS, ROUTINE W REFLEX MICROSCOPIC  PREGNANCY, URINE    EKG EKG Interpretation  Date/Time:  Friday August 07 2019 19:32:45 EST Ventricular Rate:  100 PR Interval:    QRS Duration: 60 QT Interval:  323 QTC Calculation: 417 R Axis:   60 Text Interpretation: Sinus tachycardia Since last ECG< rate has increased, no other significant abnormalities Confirmed by Gareth Morgan [phone]) on 08/07/2019 9:29:50 PM   Radiology US Abdomen Limited RUQ  Result Date: 08/07/2019 CLINICAL DATA:  Epigastric pain EXAM: ULTRASOUND ABDOMEN LIMITED RIGHT UPPER QUADRANT COMPARISON:  CT dated 04/02/2015 FINDINGS: Gallbladder: Multiple gallstones are noted. The gallbladder wall is mildly thickened measuring 3 mm. There is no pericholecystic free fluid. The sonographic Percell Miller sign is reported as negative. Common bile duct: Diameter: 3 mm Liver: No focal lesion identified. Within normal limits in parenchymal echogenicity. Portal vein is patent on color Doppler imaging with normal direction of blood flow towards the liver. Other: None. IMPRESSION: There is cholelithiasis  without secondary signs of acute cholecystitis. Electronically Signed   By: Constance Holster M.D.   On: 08/07/2019 21:10    Procedures Procedures (including critical care time)  Medications Ordered in ED Medications  sodium chloride 0.9 % bolus 1,000 mL (0 mLs Intravenous Stopped 08/07/19 2141)    ED Course  I have reviewed the triage vital signs and the nursing notes.  Pertinent labs & imaging results that were available during my care of the patient were reviewed by me and considered in my medical decision making (see chart for details).    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  Labs show mild transaminitis, no sign of pancreatitis.  No sign of appendicitis, SBO, perforated PUD, torsion, nephrolithiasis, diverticulitis by history or exam.   RUQ US shows cholelithiasis without evidence of cholecystitis. Pain resolved in the emergency department. Mild transaminitis likely secondary to biliary colic however given resolution of pain feel outpatient follow up with General Surgery and strict return precautions is appropriate.  UA without signs of infection at this time, but discussed continuing keflex that had been previously rx by PCP for UTI.  Patient discharged in stable condition with understanding of reasons to return.          Final Clinical Impression(s) / ED Diagnoses Final diagnoses:  RUQ abdominal pain  Symptomatic cholelithiasis    Rx / DC Orders ED Discharge Orders    None       Gareth Morgan, MD 08/08/19 1459

## 2019-08-07 NOTE — ED Triage Notes (Signed)
Abdominal pain. She feels like she has gallstones.

## 2019-08-09 LAB — URINE CULTURE: Culture: <10000 — AB

## 2019-08-11 ENCOUNTER — Other Ambulatory Visit: Payer: Self-pay

## 2019-08-12 ENCOUNTER — Other Ambulatory Visit: Payer: Self-pay | Admitting: General Surgery

## 2019-08-12 ENCOUNTER — Ambulatory Visit (INDEPENDENT_AMBULATORY_CARE_PROVIDER_SITE_OTHER): Payer: No Typology Code available for payment source | Admitting: Medical

## 2019-08-12 ENCOUNTER — Encounter: Payer: Self-pay | Admitting: Medical

## 2019-08-12 ENCOUNTER — Observation Stay (HOSPITAL_COMMUNITY)
Admission: AD | Admit: 2019-08-12 | Discharge: 2019-08-13 | Disposition: A | Discharge status: Home / Self Care | Payer: No Typology Code available for payment source | Source: Ambulatory Visit | Attending: General Surgery | Admitting: General Surgery

## 2019-08-12 VITALS — BP 113/65 | HR 86 | Temp 96.5°F | Resp 12 | Ht 70.0 in | Wt 167.0 lb

## 2019-08-12 DIAGNOSIS — K819 Cholecystitis, unspecified: Secondary | ICD-10-CM

## 2019-08-12 DIAGNOSIS — I10 Essential (primary) hypertension: Secondary | ICD-10-CM | POA: Diagnosis not present

## 2019-08-12 DIAGNOSIS — I471 Supraventricular tachycardia: Secondary | ICD-10-CM | POA: Diagnosis not present

## 2019-08-12 DIAGNOSIS — E119 Type 2 diabetes mellitus without complications: Secondary | ICD-10-CM | POA: Insufficient documentation

## 2019-08-12 DIAGNOSIS — G43909 Migraine, unspecified, not intractable, without status migrainosus: Secondary | ICD-10-CM | POA: Insufficient documentation

## 2019-08-12 DIAGNOSIS — K219 Gastro-esophageal reflux disease without esophagitis: Secondary | ICD-10-CM | POA: Diagnosis not present

## 2019-08-12 DIAGNOSIS — K8000 Calculus of gallbladder with acute cholecystitis without obstruction: Secondary | ICD-10-CM | POA: Diagnosis present

## 2019-08-12 DIAGNOSIS — K8010 Calculus of gallbladder with chronic cholecystitis without obstruction: Secondary | ICD-10-CM | POA: Diagnosis not present

## 2019-08-12 DIAGNOSIS — K810 Acute cholecystitis: Secondary | ICD-10-CM

## 2019-08-12 DIAGNOSIS — R1011 Right upper quadrant pain: Secondary | ICD-10-CM | POA: Diagnosis not present

## 2019-08-12 DIAGNOSIS — G35 Multiple sclerosis: Secondary | ICD-10-CM | POA: Diagnosis not present

## 2019-08-12 LAB — COMPREHENSIVE METABOLIC PANEL
ALT: 175 U/L — ABNORMAL HIGH (ref 0–35)
AST: 39 U/L — ABNORMAL HIGH (ref 0–37)
Albumin: 4.6 g/dL (ref 3.5–5.2)
Alkaline Phosphatase: 209 U/L — ABNORMAL HIGH (ref 39–117)
BUN: 8 mg/dL (ref 6–23)
CO2: 27 mEq/L (ref 19–32)
Calcium: 9.4 mg/dL (ref 8.4–10.5)
Chloride: 102 mEq/L (ref 96–112)
Creatinine, Ser: 0.72 mg/dL (ref 0.40–1.20)
GFR: 90.56 mL/min (ref >60.00)
Glucose, Bld: 76 mg/dL (ref 70–99)
Potassium: 4.1 mEq/L (ref 3.5–5.1)
Sodium: 139 mEq/L (ref 135–145)
Total Bilirubin: 1.5 mg/dL — ABNORMAL HIGH (ref 0.2–1.2)
Total Protein: 7.3 g/dL (ref 6.0–8.3)

## 2019-08-12 LAB — CBC WITH DIFFERENTIAL/PLATELET
Basophils Absolute: 0 10*3/uL (ref 0.0–0.1)
Basophils Relative: 0.4 % (ref 0.0–3.0)
Eosinophils Absolute: 0.2 10*3/uL (ref 0.0–0.7)
Eosinophils Relative: 2.7 % (ref 0.0–5.0)
HCT: 40.4 % (ref 36.0–46.0)
Hemoglobin: 13.9 g/dL (ref 12.0–15.0)
Lymphocytes Relative: 33.9 % (ref 12.0–46.0)
Lymphs Abs: 1.9 10*3/uL (ref 0.7–4.0)
MCHC: 34.3 g/dL (ref 30.0–36.0)
MCV: 90.8 fl (ref 78.0–100.0)
Monocytes Absolute: 0.4 10*3/uL (ref 0.1–1.0)
Monocytes Relative: 6.2 % (ref 3.0–12.0)
Neutro Abs: 3.2 10*3/uL (ref 1.4–7.7)
Neutrophils Relative %: 56.8 % (ref 43.0–77.0)
Platelets: 220 10*3/uL (ref 150.0–400.0)
RBC: 4.45 Mil/uL (ref 3.87–5.11)
RDW: 13.2 % (ref 11.5–15.5)
WBC: 5.6 10*3/uL (ref 4.0–10.5)

## 2019-08-12 MED ORDER — ACETAMINOPHEN 500 MG PO TABS
1000.0000 mg | ORAL_TABLET | ORAL | Status: AC
  Administered 2019-08-13: 1000 mg via ORAL

## 2019-08-12 MED ORDER — SODIUM CHLORIDE 0.9 % IV SOLN
2.0000 g | INTRAVENOUS | Status: DC
  Administered 2019-08-13: 2 g via INTRAVENOUS
  Filled 2019-08-12 (×2): qty 20

## 2019-08-12 MED ORDER — ACETAMINOPHEN 325 MG PO TABS: 650.0000 mg | ORAL_TABLET | Freq: Four times a day (QID) | ORAL | Status: DC | PRN

## 2019-08-12 MED ORDER — GABAPENTIN 300 MG PO CAPS
300.0000 mg | ORAL_CAPSULE | ORAL | Status: AC
  Administered 2019-08-13: 11:00:00 300 mg via ORAL

## 2019-08-12 MED ORDER — KCL IN DEXTROSE-NACL 20-5-0.45 MEQ/L-%-% IV SOLN
INTRAVENOUS | Status: DC
  Filled 2019-08-12: qty 1000

## 2019-08-12 MED ORDER — POLYETHYLENE GLYCOL 3350 17 G PO PACK: 17.0000 g | PACK | Freq: Every day | ORAL | Status: DC | PRN

## 2019-08-12 MED ORDER — OXYCODONE HCL 5 MG PO TABS
5.0000 mg | ORAL_TABLET | Freq: Four times a day (QID) | ORAL | Status: DC | PRN
  Administered 2019-08-13: 5 mg via ORAL
  Filled 2019-08-12: qty 1

## 2019-08-12 MED ORDER — CELECOXIB 200 MG PO CAPS
400.0000 mg | ORAL_CAPSULE | ORAL | Status: AC
  Administered 2019-08-13: 400 mg via ORAL

## 2019-08-12 MED ORDER — METOPROLOL TARTRATE 5 MG/5ML IV SOLN: 5.0000 mg | Freq: Four times a day (QID) | INTRAVENOUS | Status: DC | PRN

## 2019-08-12 MED ORDER — MORPHINE SULFATE (PF) 2 MG/ML IV SOLN: 2.0000 mg | INTRAVENOUS | Status: DC | PRN

## 2019-08-12 MED ORDER — ONDANSETRON 4 MG PO TBDP: 4.0000 mg | ORAL_TABLET | Freq: Four times a day (QID) | ORAL | Status: DC | PRN

## 2019-08-12 MED ORDER — ACETAMINOPHEN 650 MG RE SUPP: 650.0000 mg | Freq: Four times a day (QID) | RECTAL | Status: DC | PRN

## 2019-08-12 MED ORDER — ONDANSETRON HCL 4 MG/2ML IJ SOLN
4.0000 mg | Freq: Four times a day (QID) | INTRAMUSCULAR | Status: DC | PRN
  Administered 2019-08-13: 4 mg via INTRAVENOUS

## 2019-08-12 MED ORDER — ENSURE PRE-SURGERY PO LIQD
296.0000 mL | Freq: Once | ORAL | Status: AC
  Administered 2019-08-13: 296 mL via ORAL
  Filled 2019-08-12: qty 296

## 2019-08-12 NOTE — Progress Notes (Signed)
Subjective:    Patient ID: Karen Powers, female    DOB: [birth year], 38 y.o.   MRN: [id number]  HPI   Pt in for follow up from ED visit for gallstones.   Pt has appointment today with general surgeon at [facility]. She states she has persistent pain on and off but very frequent despite not eating fatty foods.   Pt notes that she has been having pain off and on since birth of her son. But recently became constant.     FINDINGS: Gallbladder:  Multiple gallstones are noted. The gallbladder wall is mildly thickened measuring 3 mm. There is no pericholecystic free fluid. The sonographic Percell Miller sign is reported as negative.  Common bile duct:  Diameter: 3 mm  Liver:  No focal lesion identified. Within normal limits in parenchymal echogenicity. Portal vein is patent on color Doppler imaging with normal direction of blood flow towards the liver.  Other: None.  IMPRESSION: There is cholelithiasis without secondary signs of acute cholecystitis.   Pt has htn but bp well controlled today.  Review of Systems  Constitutional: Negative for chills, fatigue and fever.  Respiratory: Negative for cough, choking, shortness of breath and wheezing.   Cardiovascular: Negative for chest pain and palpitations.  Gastrointestinal: Positive for abdominal pain and nausea. Negative for blood in stool, diarrhea and vomiting.       When gets abdomen pain.  Musculoskeletal: Negative for back pain.  Skin: Negative for rash.  Hematological: Negative for adenopathy. Does not bruise/bleed easily.  Psychiatric/Behavioral: Negative for behavioral problems and confusion.    Past Medical History:  Diagnosis Date  . Acute blood loss anemia 01/06/2016  . Anemia   . Diabetes mellitus without complication ([place]) 123456   Gestational diabetes only.  Marland Kitchen Dysrhythmia   . Gestational diabetes    2nd preg only  . Headache(784.0)    otc med prn  . Heartburn in pregnancy   . Maternal iron  deficiency anemia 01/06/2016  . MS (multiple sclerosis) ([place])   . Postpartum care following cesarean delivery (5/11) 01/05/2016  . Postpartum hypertension 03/07/2019  . POTS (postural orthostatic tachycardia syndrome)   . SVT (supraventricular tachycardia) ([place])    No problems since 10/2012 - no med needed  . Vaginal Pap smear, abnormal   . Vitamin deficiency      Social History   Socioeconomic History  . Marital status: Married    Spouse name: Carmaleta Zein  . Number of children: 4  . Years of education: Not on file  . Highest education level: Not on file  Occupational History  . 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 Smoking status: Never Smoker  . Smokeless tobacco: Never Used  Substance and Sexual Activity  . Alcohol use: No  . Drug use: No  . Sexual activity: Yes    Birth control/protection: Pill    Comment: pregnant  Other Topics Concern  . Not on file  Social History Narrative   [place] daily   Right handed    Lives with husband and 4 kids   She is ER nurse       Social Determinants of Health   Financial Resource Strain:   . Difficulty of Paying Living Expenses: Not on file  Food Insecurity:   . Worried About Charity fundraiser in the Last Year: Not on file  . Ran Out of Food in the Last Year: Not on file  Transportation Needs:   . Lack of Transportation (  Medical): Not on file  . Lack of Transportation (Non-Medical): Not on file  Physical Activity:   . Days of Exercise per Week: Not on file  . Minutes of Exercise per Session: Not on file  Stress:   . Feeling of Stress : Not on file  Social Connections:   . Frequency of Communication with Friends and Family: Not on file  . Frequency of Social Gatherings with Friends and Family: Not on file  . Attends Religious Services: Not on file  . Active Member of Clubs or Organizations: Not on file  . Attends Archivist Meetings: Not on file  . Marital Status: Not on file  Intimate Partner  Violence:   . Fear of Current or Ex-Partner: Not on file  . Emotionally Abused: Not on file  . Physically Abused: Not on file  . Sexually Abused: Not on file    Past Surgical History:  Procedure Laterality Date  . ATRIAL ABLATION SURGERY  2003   FAILED  . CESAREAN SECTION  2006   Breech  . CESAREAN SECTION  2006   breech  . CESAREAN SECTION N/A 12/19/2013   Procedure: REPEAT CESAREAN SECTION;  Surgeon: Lovenia Kim, MD;  Location: [facility];  Service: Obstetrics;  Laterality: N/A;  EDD: 12/25/13  . CESAREAN SECTION N/A 01/05/2016   Procedure: Repeat CESAREAN SECTION;  Surgeon: Brien Few, MD;  Location: [facility];  Service: Obstetrics;  Laterality: N/A;  EDD: 01/12/16   . CESAREAN SECTION WITH BILATERAL TUBAL LIGATION Bilateral 02/25/2019   Procedure: Repeat CESAREAN SECTION WITH BILATERAL TUBAL LIGATION;  Surgeon: Brien Few, MD;  Location: [facility];  Service: Obstetrics;  Laterality: Bilateral;  Heather, RNFA  EDD: 03/04/19 Allergy: Adhesive, Paxil, Cipro  . COLPOSCOPY    . WISDOM TOOTH EXTRACTION      Family History  Problem Relation Age of Onset  . Multiple sclerosis Mother   . Migraines Mother   . Emphysema Father   . Heart disease Father   . Migraines Father   . Migraines Sister   . Spina bifida Brother        half brother  . Cancer Paternal Grandfather        LUNG  . Diabetes Maternal Grandmother     Allergies  Allergen Reactions  . Ciprofloxacin Rash and Other (See Comments)    Has patient had a PCN reaction causing immediate rash, facial/tongue/throat swelling, SOB or lightheadedness with hypotension: No Has patient had a PCN reaction causing severe rash involving mucus membranes or skin necrosis: No Has patient had a PCN reaction that required hospitalization No Has patient had a PCN reaction occurring within the last 10 years: Yes If all of the above answers are "NO", then may proceed with Cephalosporin use.    Current Outpatient Medications  on File Prior to Visit  Medication Sig Dispense Refill  . NIFEdipine (PROCARDIA XL/NIFEDICAL XL) 60 MG 24 hr tablet Take 1 tablet (60 mg total) by mouth daily. 30 tablet 1   No current facility-administered medications on file prior to visit.    BP 113/65 (BP Location: Right Arm, Cuff Size: Normal)   Pulse 86   Temp (!) 96.5 F (35.8 C) (Temporal)   Resp 12   Ht 5\' 10"  (1.778 m)   Wt 167 lb (75.8 kg)   SpO2 97%   BMI 23.96 kg/m       Objective:   Physical Exam  General- No acute distress. Pleasant patient. Neck- Full range of motion,  no jvd Lungs- Clear, even and unlabored. Heart- regular rate and rhythm. Neurologic- CNII- XII grossly intact.  Abdomen- soft, nt, nd, +bs, no rebound, or guarding. But tender to palpation rt upper quadrant.  Back- no cva tenderness.      Assessment & Plan:  For cholecystitis and abdomen pain will get cbc and cmp stat today. Hopefully results will be available before your appointment with surgeon this afternoon.  Will defer to surgeon if he will reorder [place]   BP is well controlled today. Continue current regimen.  Follow up 6 months provided bp well controlled and gb issues resolved/surgery done?   Mackie Pai, PA-C

## 2019-08-12 NOTE — H&P (Signed)
History of Present Illness Karen Spruce MD; 08/12/2019 4:27 PM) The patient is a 38 year old female who presents with non-malignant abdominal pain. She has had intermittent epigastric pain over the last 4 months. She had her 4th child 5 months ago. Pain has been random. Mostly right upper quadrant. Beginning 5 days ago she has had constant right upper quadrant pain. Pain has also had nausea and diarrhea involved. She denies fevers or vomiting. She has been eating bland foods and continues to have the pain.   Past Surgical History Sharyn Lull R. Rolena Infante, CMA; 08/12/2019 3:56 PM) Cesarean Section - Multiple  Oral Surgery   Diagnostic Studies History Sharyn Lull R. Rolena Infante, CMA; 08/12/2019 3:56 PM) Colonoscopy  >10 years ago Mammogram  never  Allergies Sharyn Lull R. Brooks, CMA; 08/12/2019 3:57 PM) Ciprofloxacin *CHEMICALS*   Medication History Sharyn Lull R. Brooks, CMA; 08/12/2019 3:57 PM) NIFEdipine ER Osmotic Release (60MG  Tablet ER 24HR, Oral) Active. Medications Reconciled  Social History Sharyn Lull R. Brooks, CMA; 08/12/2019 3:56 PM) Alcohol use  Remotely quit alcohol use. Caffeine use  Carbonated beverages. No drug use  Tobacco use  Never smoker.  Family History Sharyn Lull R. Rolena Infante, CMA; 08/12/2019 3:56 PM) Cancer  Father. Migraine Headache  Father, Sister. Respiratory Condition  Father.  Pregnancy / Birth History Sharyn Lull R. Rolena Infante, CMA; 08/12/2019 3:56 PM) Age at menarche  44 years. Gravida  6 Length (months) of breastfeeding  12-24 Maternal age  69-25 Para  4 Regular periods   Other Problems Sharyn Lull R. Brooks, CMA; 08/12/2019 3:56 PM) Cholelithiasis  Gastroesophageal Reflux Disease  Heart murmur  High blood pressure  Migraine Headache  Other disease, cancer, significant illness     Review of Systems [facility] R. Brooks CMA; 08/12/2019 3:56 PM) General Not Present- Appetite Loss, Chills, Fatigue, Fever, Night Sweats, Weight Gain and  Weight Loss. Skin Not Present- Change in Wart/Mole, Dryness, Hives, Jaundice, New Lesions, Non-Healing Wounds, Rash and Ulcer. HEENT Present- Wears glasses/contact lenses. Not Present- Earache, Hearing Loss, Hoarseness, Nose Bleed, Oral Ulcers, Ringing in the Ears, Seasonal Allergies, Sinus Pain, Sore Throat, Visual Disturbances and Yellow Eyes. Respiratory Not Present- Bloody sputum, Chronic Cough, Difficulty Breathing, Snoring and Wheezing. Breast Not Present- Breast Mass, Breast Pain, Nipple Discharge and Skin Changes. Cardiovascular Not Present- Chest Pain, Difficulty Breathing Lying Down, Leg Cramps, Palpitations, Rapid Heart Rate, Shortness of Breath and Swelling of Extremities. Gastrointestinal Present- Abdominal Pain, Indigestion and Nausea. Not Present- Bloating, Bloody Stool, Change in Bowel Habits, Chronic diarrhea, Constipation, Difficulty Swallowing, Excessive gas, Gets full quickly at meals, Hemorrhoids, Rectal Pain and Vomiting. Female Genitourinary Not Present- Frequency, Nocturia, Painful Urination, Pelvic Pain and Urgency. Musculoskeletal Present- Back Pain. Not Present- Joint Pain, Joint Stiffness, Muscle Pain, Muscle Weakness and Swelling of Extremities. Neurological Not Present- Decreased Memory, Fainting, Headaches, Numbness, Seizures, Tingling, Tremor, Trouble walking and Weakness. Psychiatric Not Present- Anxiety, Bipolar, Change in Sleep Pattern, Depression, Fearful and Frequent crying. Endocrine Not Present- Cold Intolerance, Excessive Hunger, Hair Changes, Heat Intolerance, Hot flashes and New Diabetes. Hematology Not Present- Blood Thinners, Easy Bruising, Excessive bleeding, Gland problems, HIV and Persistent Infections.  Vitals [employer] R. Brooks CMA; 08/12/2019 3:56 PM) 08/12/2019 3:55 PM Weight: 165.5 lb Height: 70in Body Surface Area: 1.93 m Body Mass Index: 23.75 kg/m  Temp.: 97.58F(Oral)  Pulse: 132 (Regular)  BP: 120/84 (Sitting, Left Arm,  Standard)       Physical Exam Karen Spruce MD; 08/12/2019 4:28 PM) General Note: uncomfortable and anxious   Integumentary Global Assessment Upon inspection and palpation of  skin surfaces of the - Head/Face: no rashes, ulcers, lesions or evidence of photo damage. No palpable nodules or masses and Neck: no visible lesions or palpable masses.  Head and Neck Head-normocephalic, atraumatic with no lesions or palpable masses. Face Global Assessment - atraumatic. Thyroid Gland Characteristics - normal size and consistency.  Eye Eyeball - Bilateral-Extraocular movements intact. Sclera/Conjunctiva - Bilateral-No scleral icterus, No Discharge - Bilateral.  ENMT Nose and Sinuses External Inspection of the Nose - no deformities observed, no swelling present.  Chest and Lung Exam Palpation Palpation of the chest reveals - Non-tender. Auscultation Breath sounds - Normal.  Cardiovascular Note: tachycardic   Abdomen Inspection Inspection of the abdomen reveals - No Visible peristalsis, No Abnormal pulsations and No Paradoxical movements. Palpation/Percussion Palpation and Percussion of the abdomen reveal - Soft, No Rebound tenderness, No Rigidity (guarding), No hepatosplenomegaly and No Palpable abdominal masses. Tenderness - Right Upper Quadrant. Gallbladder - Negative Murphy's sign.  Peripheral Vascular Upper Extremity Palpation - Pulses bilaterally normal. Lower Extremity Palpation - Bilateral - Edema - No edema - Bilateral.  Neurologic Neurologic evaluation reveals -normal sensation and normal coordination.  Neuropsychiatric Mental status exam performed with findings of-able to articulate well with normal speech/language, rate, volume and coherence and thought content normal with ability to perform basic computations and apply abstract reasoning.  Musculoskeletal Normal Exam - Bilateral-Upper Extremity Strength Normal and Lower Extremity Strength  Normal.    Assessment & Plan Karen Spruce MD; 08/12/2019 4:29 PM) ACUTE CALCULOUS CHOLECYSTITIS (K80.00) Impression: 38 yo female with 5 days of persistent RUQ pain. US showing borderline wall thickening and large amount of stones. She is very uncomfortable in the exam room and very tender on exam. We discussed the etiology of gallstones and probably cause pain. We discussed exacerbating factors including fatty meals. We discussed the details of surgery for removal of the gallbladder including general anesthesia, 4 small incisions in the patient's abdomen, removal of the patient's gallbladder with the liver and common bile duct, and most likely outpatient procedure. We discussed risks of common bile duct injury, cystic duct stump leak, injury to liver, bleeding, infection, need for open procedure, and post cholecystectomy syndrome. The patient showed good understanding and wanted to proceed with direct admission to acute team with plan for surgery tomorrow.

## 2019-08-12 NOTE — Patient Instructions (Addendum)
For cholecystitis and abdomen pain will get cbc and cmp stat today. Hopefully results will be available before your appointment with surgeon this afternoon.  Will defer to surgeon if he will reorder [place]   BP is well controlled today. Continue current regimen.  Follow up 6 months provided bp well controlled and gb issues resolved/surgery done

## 2019-08-13 ENCOUNTER — Encounter (HOSPITAL_COMMUNITY): Payer: Self-pay | Admitting: General Surgery

## 2019-08-13 ENCOUNTER — Observation Stay (HOSPITAL_COMMUNITY): Payer: No Typology Code available for payment source | Admitting: Certified Registered Nurse Anesthetist

## 2019-08-13 ENCOUNTER — Telehealth: Payer: Self-pay | Admitting: Medical

## 2019-08-13 ENCOUNTER — Observation Stay (HOSPITAL_COMMUNITY): Payer: No Typology Code available for payment source

## 2019-08-13 ENCOUNTER — Encounter (HOSPITAL_COMMUNITY): Admission: AD | Disposition: A | Discharge status: Home / Self Care | Payer: Self-pay | Source: Ambulatory Visit

## 2019-08-13 ENCOUNTER — Other Ambulatory Visit: Payer: Self-pay

## 2019-08-13 DIAGNOSIS — K8000 Calculus of gallbladder with acute cholecystitis without obstruction: Secondary | ICD-10-CM

## 2019-08-13 DIAGNOSIS — K8010 Calculus of gallbladder with chronic cholecystitis without obstruction: Secondary | ICD-10-CM | POA: Diagnosis not present

## 2019-08-13 HISTORY — PX: CHOLECYSTECTOMY: SHX55

## 2019-08-13 LAB — SURGICAL PCR SCREEN
MRSA, PCR: NEGATIVE
Staphylococcus aureus: NEGATIVE

## 2019-08-13 LAB — COMPREHENSIVE METABOLIC PANEL
ALT: 137 U/L — ABNORMAL HIGH (ref 0–44)
AST: 33 U/L (ref 15–41)
Albumin: 3.7 g/dL (ref 3.5–5.0)
Alkaline Phosphatase: 182 U/L — ABNORMAL HIGH (ref 38–126)
Anion gap: 11 (ref 5–15)
BUN: 12 mg/dL (ref 6–20)
CO2: 24 mmol/L (ref 22–32)
Calcium: 8.4 mg/dL — ABNORMAL LOW (ref 8.9–10.3)
Chloride: 104 mmol/L (ref 98–111)
Creatinine, Ser: 0.61 mg/dL (ref 0.44–1.00)
GFR calc Af Amer: >60 mL/min (ref >60)
GFR calc non Af Amer: >60 mL/min (ref >60)
Glucose, Bld: 93 mg/dL (ref 70–99)
Potassium: 3.2 mmol/L — ABNORMAL LOW (ref 3.5–5.1)
Sodium: 139 mmol/L (ref 135–145)
Total Bilirubin: 1.1 mg/dL (ref 0.3–1.2)
Total Protein: 6.4 g/dL — ABNORMAL LOW (ref 6.5–8.1)

## 2019-08-13 LAB — CBC
HCT: 37.5 % (ref 36.0–46.0)
Hemoglobin: 12.5 g/dL (ref 12.0–15.0)
MCH: 31.2 pg (ref 26.0–34.0)
MCHC: 33.3 g/dL (ref 30.0–36.0)
MCV: 93.5 fL (ref 80.0–100.0)
Platelets: 194 10*3/uL (ref 150–400)
RBC: 4.01 MIL/uL (ref 3.87–5.11)
RDW: 12.6 % (ref 11.5–15.5)
WBC: 6 10*3/uL (ref 4.0–10.5)
nRBC: 0 % (ref 0.0–0.2)

## 2019-08-13 SURGERY — LAPAROSCOPIC CHOLECYSTECTOMY WITH INTRAOPERATIVE CHOLANGIOGRAM
Anesthesia: General | Site: Abdomen

## 2019-08-13 MED ORDER — MIDAZOLAM HCL 5 MG/5ML IJ SOLN
INTRAMUSCULAR | Status: DC | PRN
  Administered 2019-08-13: 2 mg via INTRAVENOUS

## 2019-08-13 MED ORDER — KETOROLAC TROMETHAMINE 30 MG/ML IJ SOLN
INTRAMUSCULAR | Status: AC
  Filled 2019-08-13: qty 1

## 2019-08-13 MED ORDER — BUPIVACAINE HCL (PF) 0.5 % IJ SOLN
INTRAMUSCULAR | Status: AC
  Filled 2019-08-13: qty 30

## 2019-08-13 MED ORDER — FENTANYL CITRATE (PF) 100 MCG/2ML IJ SOLN
INTRAMUSCULAR | Status: DC | PRN
  Administered 2019-08-13 (×2): 50 ug via INTRAVENOUS
  Administered 2019-08-13: 100 ug via INTRAVENOUS

## 2019-08-13 MED ORDER — KETAMINE HCL 10 MG/ML IJ SOLN
INTRAMUSCULAR | Status: DC | PRN
  Administered 2019-08-13: 40 mg via INTRAVENOUS

## 2019-08-13 MED ORDER — ROCURONIUM BROMIDE 10 MG/ML (PF) SYRINGE
PREFILLED_SYRINGE | INTRAVENOUS | Status: AC
  Filled 2019-08-13: qty 10

## 2019-08-13 MED ORDER — FENTANYL CITRATE (PF) 100 MCG/2ML IJ SOLN
25.0000 ug | INTRAMUSCULAR | Status: DC | PRN
  Administered 2019-08-13: 13:00:00 50 ug via INTRAVENOUS

## 2019-08-13 MED ORDER — MIDAZOLAM HCL 2 MG/2ML IJ SOLN
INTRAMUSCULAR | Status: AC
  Filled 2019-08-13: qty 2

## 2019-08-13 MED ORDER — LACTATED RINGERS IR SOLN
Status: DC | PRN
  Administered 2019-08-13: 1000 mL

## 2019-08-13 MED ORDER — SUGAMMADEX SODIUM 200 MG/2ML IV SOLN
INTRAVENOUS | Status: DC | PRN
  Administered 2019-08-13: 200 mg via INTRAVENOUS

## 2019-08-13 MED ORDER — PROMETHAZINE HCL 25 MG/ML IJ SOLN: 6.2500 mg | INTRAMUSCULAR | Status: DC | PRN

## 2019-08-13 MED ORDER — GABAPENTIN 300 MG PO CAPS
ORAL_CAPSULE | ORAL | Status: AC
  Filled 2019-08-13: qty 1

## 2019-08-13 MED ORDER — ONDANSETRON HCL 4 MG/2ML IJ SOLN
INTRAMUSCULAR | Status: AC
  Filled 2019-08-13: qty 2

## 2019-08-13 MED ORDER — FENTANYL CITRATE (PF) 100 MCG/2ML IJ SOLN
INTRAMUSCULAR | Status: AC
  Filled 2019-08-13: qty 2

## 2019-08-13 MED ORDER — FENTANYL CITRATE (PF) 250 MCG/5ML IJ SOLN
INTRAMUSCULAR | Status: AC
  Filled 2019-08-13: qty 5

## 2019-08-13 MED ORDER — DEXAMETHASONE SODIUM PHOSPHATE 10 MG/ML IJ SOLN
INTRAMUSCULAR | Status: AC
  Filled 2019-08-13: qty 1

## 2019-08-13 MED ORDER — KETOROLAC TROMETHAMINE 15 MG/ML IJ SOLN
INTRAMUSCULAR | Status: DC | PRN
  Administered 2019-08-13: 15 mg via INTRAVENOUS

## 2019-08-13 MED ORDER — OXYCODONE HCL 5 MG PO TABS: 5.0000 mg | ORAL_TABLET | Freq: Four times a day (QID) | ORAL | 0 refills | Status: DC | PRN

## 2019-08-13 MED ORDER — LACTATED RINGERS IV SOLN: INTRAVENOUS | Status: DC

## 2019-08-13 MED ORDER — PROPOFOL 10 MG/ML IV BOLUS
INTRAVENOUS | Status: DC | PRN
  Administered 2019-08-13: 170 mg via INTRAVENOUS

## 2019-08-13 MED ORDER — ROCURONIUM BROMIDE 10 MG/ML (PF) SYRINGE
PREFILLED_SYRINGE | INTRAVENOUS | Status: DC | PRN
  Administered 2019-08-13: 60 mg via INTRAVENOUS

## 2019-08-13 MED ORDER — CELECOXIB 200 MG PO CAPS
ORAL_CAPSULE | ORAL | Status: AC
  Filled 2019-08-13: qty 2

## 2019-08-13 MED ORDER — MUPIROCIN 2 % EX OINT
1.0000 "application " | TOPICAL_OINTMENT | Freq: Two times a day (BID) | CUTANEOUS | Status: DC
  Administered 2019-08-13: 1 via NASAL
  Filled 2019-08-13: qty 22

## 2019-08-13 MED ORDER — KETAMINE HCL 10 MG/ML IJ SOLN
INTRAMUSCULAR | Status: AC
  Filled 2019-08-13: qty 1

## 2019-08-13 MED ORDER — LIDOCAINE 20MG/ML (2%) 15 ML SYRINGE OPTIME
INTRAMUSCULAR | Status: DC | PRN
  Administered 2019-08-13: 1.5 mg/kg/h via INTRAVENOUS

## 2019-08-13 MED ORDER — SODIUM CHLORIDE 0.9 % IV SOLN
INTRAVENOUS | Status: DC | PRN
  Administered 2019-08-13: 6.5 mL

## 2019-08-13 MED ORDER — SCOPOLAMINE 1 MG/3DAYS TD PT72
1.0000 | MEDICATED_PATCH | TRANSDERMAL | Status: DC
  Administered 2019-08-13: 1.5 mg via TRANSDERMAL
  Filled 2019-08-13: qty 1

## 2019-08-13 MED ORDER — LIDOCAINE 2% (20 MG/ML) 5 ML SYRINGE
INTRAMUSCULAR | Status: AC
  Filled 2019-08-13: qty 5

## 2019-08-13 MED ORDER — PROPOFOL 10 MG/ML IV BOLUS
INTRAVENOUS | Status: AC
  Filled 2019-08-13: qty 20

## 2019-08-13 MED ORDER — ACETAMINOPHEN 500 MG PO TABS: 1000.0000 mg | ORAL_TABLET | Freq: Four times a day (QID) | ORAL | Status: DC

## 2019-08-13 MED ORDER — BUPIVACAINE HCL (PF) 0.25 % IJ SOLN: 30.0000 mL | Freq: Once | INTRAMUSCULAR | Status: DC

## 2019-08-13 MED ORDER — LIDOCAINE 2% (20 MG/ML) 5 ML SYRINGE
INTRAMUSCULAR | Status: DC | PRN
  Administered 2019-08-13: 60 mg via INTRAVENOUS

## 2019-08-13 MED ORDER — ACETAMINOPHEN 500 MG PO TABS
ORAL_TABLET | ORAL | Status: AC
  Filled 2019-08-13: qty 2

## 2019-08-13 MED ORDER — DEXAMETHASONE SODIUM PHOSPHATE 10 MG/ML IJ SOLN
INTRAMUSCULAR | Status: DC | PRN
  Administered 2019-08-13: 10 mg via INTRAVENOUS

## 2019-08-13 MED FILL — oxyCODONE HCL 5 MG TABS: 5 | 5 days supply | Qty: 20 | Fill #0

## 2019-08-13 SURGICAL SUPPLY — 50 items
APPLICATOR ARISTA FLEXITIP XL (MISCELLANEOUS) IMPLANT
APPLIER CLIP 5 13 M/L LIGAMAX5 (MISCELLANEOUS) ×2
APPLIER CLIP ROT 10 11.4 M/L (STAPLE)
BENZOIN TINCTURE PRP APPL 2/3 (GAUZE/BANDAGES/DRESSINGS) IMPLANT
BNDG ADH 1X3 SHEER STRL LF (GAUZE/BANDAGES/DRESSINGS) ×8 IMPLANT
CABLE HIGH FREQUENCY MONO STRZ (ELECTRODE) ×2 IMPLANT
CHLORAPREP W/TINT 26 (MISCELLANEOUS) ×2 IMPLANT
CLIP APPLIE 5 13 M/L LIGAMAX5 (MISCELLANEOUS) IMPLANT
CLIP APPLIE ROT 10 11.4 M/L (STAPLE) IMPLANT
CLIP VESOLOCK MED LG 6/CT (CLIP) IMPLANT
CLSR STERI-STRIP ANTIMIC 1/2X4 (GAUZE/BANDAGES/DRESSINGS) ×1 IMPLANT
COVER MAYO STAND STRL (DRAPES) IMPLANT
COVER SURGICAL LIGHT HANDLE (MISCELLANEOUS) ×2 IMPLANT
COVER WAND RF STERILE (DRAPES) IMPLANT
DECANTER SPIKE VIAL GLASS SM (MISCELLANEOUS) ×2 IMPLANT
DERMABOND ADVANCED (GAUZE/BANDAGES/DRESSINGS)
DERMABOND ADVANCED .7 DNX12 (GAUZE/BANDAGES/DRESSINGS) IMPLANT
DRAPE C-ARM 42X120 X-RAY (DRAPES) IMPLANT
DRSG TEGADERM 2-3/8X2-3/4 SM (GAUZE/BANDAGES/DRESSINGS) IMPLANT
ELECT REM PT RETURN 15FT ADLT (MISCELLANEOUS) ×2 IMPLANT
GAUZE SPONGE 2X2 8PLY STRL LF (GAUZE/BANDAGES/DRESSINGS) IMPLANT
GLOVE BIO SURGEON STRL SZ7.5 (GLOVE) ×2 IMPLANT
GLOVE INDICATOR 8.0 STRL GRN (GLOVE) ×2 IMPLANT
GOWN STRL REUS W/TWL XL LVL3 (GOWN DISPOSABLE) ×6 IMPLANT
GRASPER SUT TROCAR 14GX15 (MISCELLANEOUS) ×1 IMPLANT
HEMOSTAT ARISTA ABSORB 3G PWDR (HEMOSTASIS) IMPLANT
HEMOSTAT SNOW SURGICEL 2X4 (HEMOSTASIS) ×1 IMPLANT
KIT BASIN OR (CUSTOM PROCEDURE TRAY) ×2 IMPLANT
KIT TURNOVER KIT A (KITS) IMPLANT
L-HOOK LAP DISP 36CM (ELECTROSURGICAL)
LHOOK LAP DISP 36CM (ELECTROSURGICAL) IMPLANT
POUCH RETRIEVAL ECOSAC 10 (ENDOMECHANICALS) ×1 IMPLANT
POUCH RETRIEVAL ECOSAC 10MM (ENDOMECHANICALS) ×1
SCISSORS LAP 5X35 DISP (ENDOMECHANICALS) ×2 IMPLANT
SET CHOLANGIOGRAPH MIX (MISCELLANEOUS) IMPLANT
SET IRRIG TUBING LAPAROSCOPIC (IRRIGATION / IRRIGATOR) ×2 IMPLANT
SET TUBE SMOKE EVAC HIGH FLOW (TUBING) ×2 IMPLANT
SLEEVE XCEL OPT CAN 5 100 (ENDOMECHANICALS) ×4 IMPLANT
SPONGE GAUZE 2X2 STER 10/PKG (GAUZE/BANDAGES/DRESSINGS)
STRIP CLOSURE SKIN 1/2X4 (GAUZE/BANDAGES/DRESSINGS) IMPLANT
SUT MNCRL AB 4-0 PS2 18 (SUTURE) ×2 IMPLANT
SUT VIC AB 0 UR5 27 (SUTURE) IMPLANT
SUT VICRYL 0 TIES 12 18 (SUTURE) IMPLANT
SUT VICRYL 0 UR6 27IN ABS (SUTURE) ×1 IMPLANT
TOWEL OR 17X26 10 PK STRL BLUE (TOWEL DISPOSABLE) ×2 IMPLANT
TOWEL OR NON WOVEN STRL DISP B (DISPOSABLE) ×2 IMPLANT
TRAY LAPAROSCOPIC (CUSTOM PROCEDURE TRAY) ×2 IMPLANT
TROCAR BLADELESS OPT 5 100 (ENDOMECHANICALS) ×2 IMPLANT
TROCAR XCEL BLUNT TIP 100MML (ENDOMECHANICALS) IMPLANT
TROCAR XCEL NON-BLD 11X100MML (ENDOMECHANICALS) IMPLANT

## 2019-08-13 NOTE — Progress Notes (Signed)
Patient does not have recent Covid test on file. Patient states she was Covid+ in November. Paged [facility].

## 2019-08-13 NOTE — Op Note (Signed)
Karen Powers [id number] 1981/04/12 08/13/2019  Laparoscopic Cholecystectomy with IOC Procedure Note  Indications: This patient presents with symptomatic gallbladder disease and will undergo laparoscopic cholecystectomy.  Pre-operative Diagnosis: Calculus of gallbladder with acute cholecystitis, without mention of obstruction  Post-operative Diagnosis: Same  Surgeon: Greer Pickerel MD FACS  Assistants: none  Anesthesia: General endotracheal anesthesia  Procedure Details  The patient was seen again in the Holding Room. The risks, benefits, complications, treatment options, and expected outcomes were discussed with the patient. The possibilities of reaction to medication, pulmonary aspiration, perforation of viscus, bleeding, recurrent infection, finding a normal gallbladder, the need for additional procedures, failure to diagnose a condition, the possible need to convert to an open procedure, and creating a complication requiring transfusion or operation were discussed with the patient. The likelihood of improving the patient's symptoms with return to their baseline status is good.  The patient and/or family concurred with the proposed plan, giving informed consent. The site of surgery properly noted. The patient was taken to Operating Room, identified as Karen Powers and the procedure verified as Laparoscopic Cholecystectomy with Intraoperative Cholangiogram. A Time Out was held and the above information confirmed. Antibiotic prophylaxis was administered.   Prior to the induction of general anesthesia, antibiotic prophylaxis was administered. General endotracheal anesthesia was then administered and tolerated well. After the induction, the abdomen was prepped with Chloraprep and draped in the sterile fashion. The patient was positioned in the supine position.  Local anesthetic agent was injected into the skin near the umbilicus and an incision made.  We dissected down to the abdominal  fascia with blunt dissection.  She had a small pre-existing fascial defect at the umbilicus so I decided to use the pre-existing defect and I incised it and made it a little bit larger.  we entered the peritoneal cavity bluntly.  A pursestring suture of 0-Vicryl was placed around the fascial opening.  The Hasson cannula was inserted and secured with the stay suture.  Pneumoperitoneum was then created with CO2 and tolerated well without any adverse changes in the patient's vital signs. An 5-mm port was placed in the subxiphoid position.  Two 5-mm ports were placed in the right upper quadrant. All skin incisions were infiltrated with a local anesthetic agent before making the incision and placing the trocars.   We positioned the patient in reverse Trendelenburg, tilted slightly to the patient's left.  The gallbladder was identified, the fundus grasped and retracted cephalad. Adhesions were lysed bluntly and with the electrocautery where indicated, taking care not to injure any adjacent organs or viscus. The infundibulum was grasped and retracted laterally, exposing the peritoneum overlying the triangle of Calot. This was then divided and exposed in a blunt fashion. A critical view of the cystic duct and cystic artery was obtained.  The cystic duct was clearly identified and bluntly dissected circumferentially. The cystic duct was ligated with a clip distally.   An incision was made in the cystic duct and the [employer] cholangiogram catheter introduced. The catheter was secured using a clip. A cholangiogram was then obtained which showed good visualization of the distal and proximal biliary tree with no sign of filling defects or obstruction however it was dilated.  Contrast flowed easily into the duodenum. The catheter was then removed.   The cystic duct was then ligated with clips and divided. The cystic artery which had been identified & dissected free was ligated with clips and divided as well.   The gallbladder  was dissected from  the liver bed in retrograde fashion with the electrocautery. The liver bed was irrigated and inspected. Hemostasis was ultimately achieved with the electrocautery.  I did place a piece of surgical snow in the gallbladder fossa.  Copious irrigation was utilized and was repeatedly aspirated until clear.   The gallbladder was removed and placed in an Ecco sac.  The gallbladder and Ecco sac were then removed through the umbilical port site. The pursestring suture was used to close the umbilical fascia.  I did place an additional interrupted 0 Vicryl at the umbilical fascia using a PMI suture passer with laparoscopic guidance.  Additional local was infiltrated in the region  We again inspected the right upper quadrant for hemostasis.  The umbilical closure was inspected and there was no air leak and nothing trapped within the closure. Pneumoperitoneum was released as we removed the trocars.  4-0 Monocryl was used to close the skin.  Benzoin, steri-strips, and clean dressings were applied. The patient was then extubated and brought to the recovery room in stable condition. Instrument, sponge, and needle counts were correct at closure and at the conclusion of the case.   Findings: Early acute Cholecystitis with Cholelithiasis Dilated bile ducts but no filling defects.  +snow  Estimated Blood Loss: Minimal         Drains: none         Specimens: Gallbladder           Complications: None; patient tolerated the procedure well.         Disposition: PACU - hemodynamically stable.         Condition: stable  Leighton Ruff. Redmond Pulling, MD, FACS General, Bariatric, & Minimally Invasive Surgery [facility], [place]

## 2019-08-13 NOTE — Anesthesia Postprocedure Evaluation (Signed)
Anesthesia Post Note  Patient: Karen Powers  Procedure(s) Performed: LAPAROSCOPIC CHOLECYSTECTOMY WITH  CHOLANGIOGRAM (N/A Abdomen)     Patient location during evaluation: PACU Anesthesia Type: General Level of consciousness: sedated Pain management: pain level controlled Vital Signs Assessment: post-procedure vital signs reviewed and stable Respiratory status: spontaneous breathing and respiratory function stable Cardiovascular status: stable Postop Assessment: no apparent nausea or vomiting Anesthetic complications: no    Last Vitals:  Vitals:   08/13/19 1400 08/13/19 1415  BP: (!) 148/90 (!) 148/93  Pulse: (!) 59 71  Resp: 11 13  Temp:  36.7 C  SpO2: 94% 97%    Last Pain:  Vitals:   08/13/19 1415  TempSrc:   PainSc: 2                  Karen Powers

## 2019-08-13 NOTE — Progress Notes (Signed)
Writer spoke with [facility] about Covid testing for patient, [facility] stated if surgery takes place later in day then have day shift nurse contact infectious disease for route to take. Per OR patient is not lisited on schedule and will likely have surgery after 11am. Norlene Duel RN, BSN

## 2019-08-13 NOTE — Telephone Encounter (Signed)
Referral to surgeon placed. She already saw but needs sent for insurance requirement.

## 2019-08-13 NOTE — Progress Notes (Signed)
Per Dr. Ninfa Linden, he is unsure whether to test patient for Covid and advised to contact other resources regarding Covid testing for patient.

## 2019-08-13 NOTE — Interval H&P Note (Signed)
History and Physical Interval Note:  08/13/2019 9:23 AM  Karen Powers  has presented today for surgery, with the diagnosis of cholecystitis.  The various methods of treatment have been discussed with the patient and family. After consideration of risks, benefits and other options for treatment, the patient has consented to  Procedure(s): LAPAROSCOPIC CHOLECYSTECTOMY WITH INTRAOPERATIVE CHOLANGIOGRAM (N/A) as a surgical intervention.  The patient's history has been reviewed, patient examined, no change in status, stable for surgery.  I have reviewed the patient's chart and labs.  Questions were answered to the patient's satisfaction.    Pt seen & examined Had covid in mid nov. Around Nov 13.  Currently no covid symptoms.  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  I discussed laparoscopic cholecystectomy with IOC in detail.  The patient was shown diagrams detailing the procedure.  We discussed the risks and benefits of a laparoscopic cholecystectomy including, but not limited to bleeding, infection, injury to surrounding structures such as the intestine or liver, bile leak, retained gallstones, need to convert to an open procedure, prolonged diarrhea, blood clots such as  DVT, common bile duct injury, anesthesia risks, and possible need for additional procedures.  We discussed the typical post-operative recovery course. I explained that the likelihood of improvement of their symptoms is good.  Leighton Ruff. Redmond Pulling, MD, FACS General, Bariatric, & Minimally Invasive Surgery [facility] [place]   Greer Pickerel

## 2019-08-13 NOTE — Transfer of Care (Signed)
Immediate Anesthesia Transfer of Care Note  Patient: Karen Powers  Procedure(s) Performed: Procedure(s): LAPAROSCOPIC CHOLECYSTECTOMY WITH  CHOLANGIOGRAM (N/A)  Patient Location: PACU  Anesthesia Type:General  Level of Consciousness: Alert, Awake, Oriented  Airway & Oxygen Therapy: Patient Spontanous Breathing  Post-op Assessment: Report given to RN  Post vital signs: Reviewed and stable  Last Vitals:  Vitals:   08/13/19 1029 08/13/19 1315  BP: (!) 129/96 (!) 148/99  Pulse: 72 80  Resp: 16 11  Temp: 36.8 C 36.7 C  SpO2: 123XX123 [id number]    Complications: No apparent anesthesia complications

## 2019-08-13 NOTE — Anesthesia Procedure Notes (Signed)
Procedure Name: Intubation Date/Time: 08/13/2019 11:44 AM Performed by: Gerald Leitz, CRNA Pre-anesthesia Checklist: Patient identified, Patient being monitored, Timeout performed, Emergency Drugs available and Suction available Patient Re-evaluated:Patient Re-evaluated prior to induction Oxygen Delivery Method: Circle system utilized Preoxygenation: Pre-oxygenation with 100% oxygen Induction Type: IV induction Ventilation: Mask ventilation without difficulty Laryngoscope Size: Mac and 3 Grade View: Grade I Tube type: Oral Tube size: 7.0 mm Number of attempts: 1 Placement Confirmation: ETT inserted through vocal cords under direct vision,  positive ETCO2 and breath sounds checked- equal and bilateral Secured at: 21 cm Tube secured with: Tape Dental Injury: Teeth and Oropharynx as per pre-operative assessment

## 2019-08-13 NOTE — Anesthesia Preprocedure Evaluation (Signed)
Anesthesia Evaluation    Reviewed: Allergy & Precautions, NPO status , Patient's Chart, lab work & pertinent test results  Airway Mallampati: I  TM Distance: >3 FB Neck ROM: Full    Dental no notable dental hx. (+) Teeth Intact   Pulmonary neg pulmonary ROS,    Pulmonary exam normal breath sounds clear to auscultation       Cardiovascular hypertension, Normal cardiovascular exam+ dysrhythmias Supra Ventricular Tachycardia  Rhythm:Regular Rate:Normal  Echocardiogram 05/19/2019:  1. Normal LV systolic function with EF 55%. Left ventricle cavity is normal in size. Normal global wall motion. Normal diastolic filling pattern. Calculated EF 55%. 2. Mild mitral valve leaflet thickening. Borderline prolapse of the mitral valve leaflets. Mild (Grade I) mitral regurgitation.   Neuro/Psych  Headaches, Multiple Sclerosis negative psych ROS   GI/Hepatic Neg liver ROS, GERD  Medicated,  Endo/Other  diabetes, GestationalHx/o DM with 2nd pregnancy  Renal/GU negative Renal ROS  negative genitourinary   Musculoskeletal negative musculoskeletal ROS (+)   Abdominal   Peds  Hematology  (+) anemia ,   Anesthesia Other Findings   Reproductive/Obstetrics                             Anesthesia Physical  Anesthesia Plan  ASA: II  Anesthesia Plan: General   Post-op Pain Management:    Induction:   PONV Risk Score and Plan: 4 or greater and Scopolamine patch - Pre-op, Ondansetron, Dexamethasone and Midazolam  Airway Management Planned: Oral ETT  Additional Equipment:   Intra-op Plan:   Post-operative Plan: Extubation in OR  Informed Consent: I have reviewed the patients History and Physical, chart, labs and discussed the procedure including the risks, benefits and alternatives for the proposed anesthesia with the patient or authorized representative who has indicated his/her understanding and acceptance.      Dental advisory given  Plan Discussed with: CRNA and Surgeon  Anesthesia Plan Comments:         Anesthesia Quick Evaluation

## 2019-08-13 NOTE — Discharge Instructions (Signed)
[facility]  LAPAROSCOPIC SURGERY: POST OP INSTRUCTIONS Always review your discharge instruction sheet given to you by the facility where your surgery was performed. IF YOU HAVE DISABILITY OR FAMILY LEAVE FORMS, YOU MUST BRING THEM TO THE OFFICE FOR PROCESSING.   DO NOT GIVE THEM TO YOUR DOCTOR.  PAIN CONTROL  1. First take acetaminophen (Tylenol) AND/or ibuprofen (Advil) to control your pain after surgery.  Follow directions on package.  Taking acetaminophen (Tylenol) and/or ibuprofen (Advil) regularly after surgery will help to control your pain and lower the amount of prescription pain medication you may need.  You should not take more than 4,000 mg (4 grams) of acetaminophen (Tylenol) in 24 hours.  You should not take ibuprofen (Advil), aleve, motrin, naprosyn or other NSAIDS if you have a history of stomach ulcers or chronic kidney disease.  2. A prescription for pain medication may be given to you upon discharge.  Take your pain medication as prescribed, if you still have uncontrolled pain after taking acetaminophen (Tylenol) or ibuprofen (Advil). 3. Use ice packs to help control pain. 4. If you need a refill on your pain medication, please contact your pharmacy.  They will contact our office to request authorization. Prescriptions will not be filled after 5pm or on week-ends.  HOME MEDICATIONS 5. Take your usually prescribed medications unless otherwise directed.  DIET 6. You should follow a light diet the first few days after arrival home.  Be sure to include lots of fluids daily. Avoid fatty, fried foods.   CONSTIPATION 7. It is common to experience some constipation after surgery and if you are taking pain medication.  Increasing fluid intake and taking a stool softener (such as Colace) will usually help or prevent this problem from occurring.  A mild laxative (Milk of Magnesia or Miralax) should be taken according to package instructions if there are no bowel  movements after 48 hours.  WOUND/INCISION CARE 8. Most patients will experience some swelling and bruising in the area of the incisions.  Ice packs will help.  Swelling and bruising can take several days to resolve.  9. Unless discharge instructions indicate otherwise, follow guidelines below  a. STERI-STRIPS - you may remove your outer bandages 48 hours after surgery, and you may shower at that time.  You have steri-strips (small skin tapes) in place directly over the incision.  These strips should be left on the skin for 7-10 days.   b. DERMABOND/SKIN GLUE - you may shower in 24 hours.  The glue will flake off over the next 2-3 weeks. 10. Any sutures or staples will be removed at the office during your follow-up visit.  ACTIVITIES 11. You may resume regular (light) daily activities beginning the next day--such as daily self-care, walking, climbing stairs--gradually increasing activities as tolerated.  You may have sexual intercourse when it is comfortable.  Refrain from any heavy lifting or straining until approved by your doctor. a. You may drive when you are no longer taking prescription pain medication, you can comfortably wear a seatbelt, and you can safely maneuver your car and apply brakes.  FOLLOW-UP 12. You should see your doctor in the office for a follow-up appointment approximately 2-3 weeks after your surgery.  You should have been given your post-op/follow-up appointment when your surgery was scheduled.  If you did not receive a post-op/follow-up appointment, make sure that you call for this appointment within a day or two after you arrive home to insure a convenient appointment time.  WHEN TO CALL YOUR DOCTOR: 1. Fever over 101.0 2. Inability to urinate 3. Continued bleeding from incision. 4. Increased pain, redness, or drainage from the incision. 5. Increasing abdominal pain  The clinic staff is available to answer your questions during regular business hours.  Please dont  hesitate to call and ask to speak to one of the nurses for clinical concerns.  If you have a medical emergency, go to the nearest emergency room or call 911.  A surgeon from [facility] is always on call at the hospital. [address], [place], [place], [place]  [address] ? [address], [place], [place]   [address] [phone] ? [phone] ? FAX [phone]     Managing Your Pain After Surgery Without Opioids    Thank you for participating in our program to help patients manage their pain after surgery without opioids. This is part of our effort to provide you with the best care possible, without exposing you or your family to the risk that opioids pose.  What pain can I expect after surgery? You can expect to have some pain after surgery. This is normal. The pain is typically worse the day after surgery, and quickly begins to get better. Many studies have found that many patients are able to manage their pain after surgery with Over-the-Counter (OTC) medications such as Tylenol and Motrin. If you have a condition that does not allow you to take Tylenol or Motrin, notify your surgical team.  How will I manage my pain? The best strategy for controlling your pain after surgery is around the clock pain control with Tylenol (acetaminophen) and Motrin (ibuprofen or Advil). Alternating these medications with each other allows you to maximize your pain control. In addition to Tylenol and Motrin, you can use heating pads or ice packs on your incisions to help reduce your pain.  How will I alternate your regular strength over-the-counter pain medication? You will take a dose of pain medication every three hours. ; Start by taking 650 mg of Tylenol (2 pills of 325 mg) ; 3 hours later take 600 mg of Motrin (3 pills of 200 mg) ; 3 hours after taking the Motrin take 650 mg of Tylenol ; 3 hours after that take 600 mg of Motrin.   - 1 -  See example - if your first  dose of Tylenol is at 12:00 PM   12:00 PM Tylenol 650 mg (2 pills of 325 mg)  3:00 PM Motrin 600 mg (3 pills of 200 mg)  6:00 PM Tylenol 650 mg (2 pills of 325 mg)  9:00 PM Motrin 600 mg (3 pills of 200 mg)  Continue alternating every 3 hours   We recommend that you follow this schedule around-the-clock for at least 3 days after surgery, or until you feel that it is no longer needed. Use the table on the last page of this handout to keep track of the medications you are taking. Important: Do not take more than 3000mg  of Tylenol or 3200mg  of Motrin in a 24-hour period. Do not take ibuprofen/Motrin if you have a history of bleeding stomach ulcers, severe kidney disease, &/or actively taking a blood thinner  What if I still have pain? If you have pain that is not controlled with the over-the-counter pain medications (Tylenol and Motrin or Advil) you might have what we call breakthrough pain. You will receive a prescription for a small amount of an opioid pain medication such as Oxycodone, Tramadol, or  Tylenol with Codeine. Use these opioid pills in the first 24 hours after surgery if you have breakthrough pain. Do not take more than 1 pill every 4-6 hours.  If you still have uncontrolled pain after using all opioid pills, don't hesitate to call our staff using the number provided. We will help make sure you are managing your pain in the best way possible, and if necessary, we can provide a prescription for additional pain medication.   Day 1    Time  Name of Medication Number of pills taken  Amount of Acetaminophen  Pain Level   Comments  AM PM       AM PM       AM PM       AM PM       AM PM       AM PM       AM PM       AM PM       Total Daily amount of Acetaminophen Do not take more than  3,000 mg per day      Day 2    Time  Name of Medication Number of pills taken  Amount of Acetaminophen  Pain Level   Comments  AM PM       AM PM       AM PM       AM PM       AM  PM       AM PM       AM PM       AM PM       Total Daily amount of Acetaminophen Do not take more than  3,000 mg per day      Day 3    Time  Name of Medication Number of pills taken  Amount of Acetaminophen  Pain Level   Comments  AM PM       AM PM       AM PM       AM PM          AM PM       AM PM       AM PM       AM PM       Total Daily amount of Acetaminophen Do not take more than  3,000 mg per day      Day 4    Time  Name of Medication Number of pills taken  Amount of Acetaminophen  Pain Level   Comments  AM PM       AM PM       AM PM       AM PM       AM PM       AM PM       AM PM       AM PM       Total Daily amount of Acetaminophen Do not take more than  3,000 mg per day      Day 5    Time  Name of Medication Number of pills taken  Amount of Acetaminophen  Pain Level   Comments  AM PM       AM PM       AM PM       AM PM       AM PM       AM PM       AM PM  AM PM       Total Daily amount of Acetaminophen Do not take more than  3,000 mg per day       Day 6    Time  Name of Medication Number of pills taken  Amount of Acetaminophen  Pain Level  Comments  AM PM       AM PM       AM PM       AM PM       AM PM       AM PM       AM PM       AM PM       Total Daily amount of Acetaminophen Do not take more than  3,000 mg per day      Day 7    Time  Name of Medication Number of pills taken  Amount of Acetaminophen  Pain Level   Comments  AM PM       AM PM       AM PM       AM PM       AM PM       AM PM       AM PM       AM PM       Total Daily amount of Acetaminophen Do not take more than  3,000 mg per day        For additional information about how and where to safely dispose of unused opioid medications - [employer]  Disclaimer: This document contains information and/or instructional materials adapted from [place] for the typical patient with your condition. It does  not replace medical advice from your health care provider because your experience may differ from that of the typical patient. Talk to your health care provider if you have any questions about this document, your condition or your treatment plan. Adapted from [place]

## 2019-08-14 LAB — SURGICAL PATHOLOGY

## 2019-08-18 ENCOUNTER — Encounter: Payer: Self-pay | Admitting: Medical

## 2019-08-19 NOTE — Discharge Summary (Signed)
Patient ID: Karen Powers:[id number] 03-Oct-1980 38 y.o.  Admit date: 08/12/2019 Discharge date: 08/13/2019  Admitting Diagnosis: Cholecystitis   Discharge Diagnosis Patient Active Problem List   Diagnosis Date Noted  . Acute calculous cholecystitis 08/12/2019  . Postpartum hypertension 03/07/2019  . Maternal anemia, with delivery 02/27/2019  . Rh negative, maternal 02/27/2019  . Previous cesarean delivery affecting pregnancy 02/25/2019  . Multiple sclerosis exacerbation ([place]) 10/22/2017  . Acute hypokalemia 10/22/2017  . POTS (postural orthostatic tachycardia syndrome) 10/22/2017  . SVT (supraventricular tachycardia) ([place]) 10/22/2017  . History of Gestational diabetes 10/22/2017  . Maternal iron deficiency anemia 01/06/2016  . Acute blood loss anemia 01/06/2016  . Postpartum care following cesarean delivery (7/1) 01/05/2016  . Wellness examination 09/28/2014  . Acute pharyngitis 09/08/2014  . Previous cesarean section 12/19/2013  . Multiple sclerosis ([place]) 09/07/2011  . Migraine 09/07/2011  . Paroxysmal SVT (supraventricular tachycardia) ([place]) 09/07/2011  . UTI (urinary tract infection) 09/07/2011    Consultants none  Reason for Admission: The patient is a 38 year old female who presents with non-malignant abdominal pain. She has had intermittent epigastric pain over the last 4 months. She had her 4th child 5 months ago. Pain has been random. Mostly right upper quadrant. Beginning 5 days ago she has had constant right upper quadrant pain. Pain has also had nausea and diarrhea involved. She denies fevers or vomiting. She has been eating bland foods and continues to have the pain.  Procedures Lap chole with IOC by Dr. Redmond Pulling 08/13/19  Hospital Course:  The patient was admitted and underwent a laparoscopic cholecystectomy with IOC.  The patient tolerated the procedure well.  On POD 0, the patient was tolerating a regular diet, voiding well, mobilizing, and pain was  controlled with oral pain medications.  The patient was stable for DC home at this time with appropriate follow up made.     Physical Exam: See note from earlier in the day  Allergies as of 08/13/2019      Reactions   Ciprofloxacin Rash, Other (See Comments)   Has patient had a PCN reaction causing immediate rash, facial/tongue/throat swelling, SOB or lightheadedness with hypotension: No Has patient had a PCN reaction causing severe rash involving mucus membranes or skin necrosis: No Has patient had a PCN reaction that required hospitalization No Has patient had a PCN reaction occurring within the last 10 years: Yes If all of the above answers are "NO", then may proceed with Cephalosporin use.      Medication List    TAKE these medications   acetaminophen 500 MG tablet Commonly known as: TYLENOL Take 1,000 mg by mouth every 6 (six) hours as needed for mild pain, moderate pain or headache.   cephALEXin 500 MG capsule Commonly known as: KEFLEX Take 500 mg by mouth 2 (two) times daily.   NIFEdipine 60 MG 24 hr tablet Commonly known as: PROCARDIA XL/NIFEDICAL XL Take 1 tablet (60 mg total) by mouth daily.   oxyCODONE 5 MG immediate release tablet Commonly known as: Oxy IR/ROXICODONE Take 1 tablet (5 mg total) by mouth every 6 (six) hours as needed for moderate pain.        Follow-up Information    [facility] Follow up on 09/03/2019.   Specialty: General Surgery Why: expect a phone call at 1:30 pm for your post op televisit appointment.  email a photo of any concerns about your incisions to [email or web address]  Contact information: [address]  [place]           Signed: Saverio Danker, [facility] 08/19/2019, 12:32 PM Please see Amion for pager number during day hours 7:00am-4:30pm

## 2019-08-20 MED ORDER — NIFEDIPINE ER OSMOTIC RELEASE 60 MG PO TB24: 60.0000 mg | ORAL_TABLET | Freq: Every day | ORAL | 5 refills | Status: DC

## 2019-08-20 MED FILL — NIFEDIPINE ER OSMOTIC RELEA: 60 | 30 days supply | Qty: 30 | Fill #0

## 2019-11-03 ENCOUNTER — Encounter: Payer: Self-pay | Admitting: Medical

## 2019-11-05 ENCOUNTER — Encounter: Payer: Self-pay | Admitting: Medical

## 2019-12-22 ENCOUNTER — Ambulatory Visit: Payer: Self-pay | Admitting: Neurology

## 2020-01-11 ENCOUNTER — Encounter: Payer: Self-pay | Admitting: Medical

## 2020-01-13 ENCOUNTER — Telehealth: Payer: Self-pay | Admitting: Medical

## 2020-01-13 MED ORDER — FLUCONAZOLE 150 MG PO TABS: 150.0000 mg | ORAL_TABLET | Freq: Once | ORAL | 0 refills | Status: AC

## 2020-01-13 MED ORDER — NITROFURANTOIN MONOHYD MACRO 100 MG PO CAPS: 100.0000 mg | ORAL_CAPSULE | Freq: Two times a day (BID) | ORAL | 0 refills | Status: DC

## 2020-01-13 NOTE — Telephone Encounter (Signed)
Rx macrobid and diflucan sent to pt pharmacy.

## 2020-01-14 MED FILL — FLUCONAZOLE 150 MG TABS: 150 | 1 days supply | Qty: 1 | Fill #0

## 2020-01-14 MED FILL — NITROFURANTOIN MONO-MCR 100: 100 | 7 days supply | Qty: 14 | Fill #0

## 2020-02-09 ENCOUNTER — Ambulatory Visit: Payer: No Typology Code available for payment source | Admitting: Medical

## 2020-03-23 ENCOUNTER — Other Ambulatory Visit: Payer: Self-pay

## 2020-03-23 ENCOUNTER — Ambulatory Visit (INDEPENDENT_AMBULATORY_CARE_PROVIDER_SITE_OTHER): Payer: No Typology Code available for payment source | Admitting: Medical

## 2020-03-23 VITALS — BP 110/80 | HR 114 | Temp 98.1°F | Resp 18 | Ht 71.0 in | Wt 145.4 lb

## 2020-03-23 DIAGNOSIS — E010 Iodine-deficiency related diffuse (endemic) goiter: Secondary | ICD-10-CM | POA: Diagnosis not present

## 2020-03-23 DIAGNOSIS — Z0000 Encounter for general adult medical examination without abnormal findings: Secondary | ICD-10-CM | POA: Diagnosis not present

## 2020-03-23 DIAGNOSIS — R5383 Other fatigue: Secondary | ICD-10-CM | POA: Diagnosis not present

## 2020-03-23 DIAGNOSIS — I471 Supraventricular tachycardia: Secondary | ICD-10-CM

## 2020-03-23 DIAGNOSIS — K5792 Diverticulitis of intestine, part unspecified, without perforation or abscess without bleeding: Secondary | ICD-10-CM | POA: Diagnosis not present

## 2020-03-23 MED ORDER — AMOXICILLIN-POT CLAVULANATE 875-125 MG PO TABS: 1.0000 | ORAL_TABLET | Freq: Two times a day (BID) | ORAL | 0 refills | Status: DC

## 2020-03-23 MED FILL — AMOX-CLAV 875-125 MG TABLET: 875-125 | 10 days supply | Qty: 20 | Fill #0

## 2020-03-23 NOTE — Patient Instructions (Addendum)
For you wellness exam today I have ordered cbc, cmp, tsh,t4, b12, b1 and lipid panel.  Vaccine up to date.  Recommend exercise and healthy diet.  We will let you know lab results as they come in.  For fatigue added b12, b1, tsh and t4.  For mild borderline enlarged thyroid get US thyroid.  For llq pain rx augmentin and follow cbc. Possible diverticulitis will see how you do on augmentin.  Want you to get smart watch and monitor. Want to see if majority of time over 100 before deciding on potential repeat former work up.  Follow up date appointment will be determined after lab review.    Preventive Care 55-44 Years Old, Female Preventive care refers to visits with your health care provider and lifestyle choices that can promote health and wellness. This includes:  A yearly physical exam. This may also be called an annual well check.  Regular dental visits and eye exams.  Immunizations.  Screening for certain conditions.  Healthy lifestyle choices, such as eating a healthy diet, getting regular exercise, not using drugs or products that contain nicotine and tobacco, and limiting alcohol use. What can I expect for my preventive care visit? Physical exam Your health care provider will check your:  Height and weight. This may be used to calculate body mass index (BMI), which tells if you are at a healthy weight.  Heart rate and blood pressure.  Skin for abnormal spots. Counseling Your health care provider may ask you questions about your:  Alcohol, tobacco, and drug use.  Emotional well-being.  Home and relationship well-being.  Sexual activity.  Eating habits.  Work and work Statistician.  Method of birth control.  Menstrual cycle.  Pregnancy history. What immunizations do I need?  Influenza (flu) vaccine  This is recommended every year. Tetanus, diphtheria, and pertussis (Tdap) vaccine  You may need a Td booster every 10 years. Varicella (chickenpox)  vaccine  You may need this if you have not been vaccinated. Human papillomavirus (HPV) vaccine  If recommended by your health care provider, you may need three doses over 6 months. Measles, mumps, and rubella (MMR) vaccine  You may need at least one dose of MMR. You may also need a second dose. Meningococcal conjugate (MenACWY) vaccine  One dose is recommended if you are age 23-21 years and a first-year college student living in a residence hall, or if you have one of several medical conditions. You may also need additional booster doses. Pneumococcal conjugate (PCV13) vaccine  You may need this if you have certain conditions and were not previously vaccinated. Pneumococcal polysaccharide (PPSV23) vaccine  You may need one or two doses if you smoke cigarettes or if you have certain conditions. Hepatitis A vaccine  You may need this if you have certain conditions or if you travel or work in places where you may be exposed to hepatitis A. Hepatitis B vaccine  You may need this if you have certain conditions or if you travel or work in places where you may be exposed to hepatitis B. Haemophilus influenzae type b (Hib) vaccine  You may need this if you have certain conditions. You may receive vaccines as individual doses or as more than one vaccine together in one shot (combination vaccines). Talk with your health care provider about the risks and benefits of combination vaccines. What tests do I need?  Blood tests  Lipid and cholesterol levels. These may be checked every 5 years starting at age 28.  Hepatitis  C test.  Hepatitis B test. Screening  Diabetes screening. This is done by checking your blood sugar (glucose) after you have not eaten for a while (fasting).  Sexually transmitted disease (STD) testing.  BRCA-related cancer screening. This may be done if you have a family history of breast, ovarian, tubal, or peritoneal cancers.  Pelvic exam and Pap test. This may be  done every 3 years starting at age 77. Starting at age 4, this may be done every 5 years if you have a Pap test in combination with an HPV test. Talk with your health care provider about your test results, treatment options, and if necessary, the need for more tests. Follow these instructions at home: Eating and drinking   Eat a diet that includes fresh fruits and vegetables, whole grains, lean protein, and low-fat dairy.  Take vitamin and mineral supplements as recommended by your health care provider.  Do not drink alcohol if: ? Your health care provider tells you not to drink. ? You are pregnant, may be pregnant, or are planning to become pregnant.  If you drink alcohol: ? Limit how much you have to 0-1 drink a day. ? Be aware of how much alcohol is in your drink. In the [place] one drink equals one 12 oz bottle of beer (355 mL), one 5 oz glass of wine (148 mL), or one 1 oz glass of hard liquor (44 mL). Lifestyle  Take daily care of your teeth and gums.  Stay active. Exercise for at least 30 minutes on 5 or more days each week.  Do not use any products that contain nicotine or tobacco, such as cigarettes, e-cigarettes, and chewing tobacco. If you need help quitting, ask your health care provider.  If you are sexually active, practice safe sex. Use a condom or other form of birth control (contraception) in order to prevent pregnancy and STIs (sexually transmitted infections). If you plan to become pregnant, see your health care provider for a preconception visit. What's next?  Visit your health care provider once a year for a well check visit.  Ask your health care provider how often you should have your eyes and teeth checked.  Stay up to date on all vaccines. This information is not intended to replace advice given to you by your health care provider. Make sure you discuss any questions you have with your health care provider. Document Revised: 04/24/2018 Document Reviewed:  04/24/2018 Elsevier Patient Education  2020 [employer].

## 2020-03-23 NOTE — Progress Notes (Signed)
Subjective:    Patient ID: Karen Powers, female    DOB: [birth year], 39 y.o.   MRN: [id number]  HPI  Pt in for wellness.  Pt is fasting. Works as Marine scientist. Pt not exercising. Pt eating better lost 23 lbs since birth of son one yr ago.  2-3 cups of coffee a day.  Pt bp is well controlled today. She is off nifedipine. Bp better controlled with weight loss.  Pt states some random fast hr. Pt has hx of svt in past. Sometimes random panic sensation in sleep. Did not wake her up on aroused then went back to sleep. Brief occiosonal dvt since teenager. Only once in her life did she have to go to ED. Pt had seen EP specialist in past. Holter done.   Mild left lower quadrant pain since Sunday. Loose stools since gb removed. Subjective low grade fever. No nausea or vomiting.  Pt thinks maybe thyroid area is full. Some fatigued. Sometime thyroid area discomfort on swallowing.      Review of Systems  Constitutional: Negative for chills, fatigue and fever.  Respiratory: Negative for cough, chest tightness, shortness of breath and wheezing.   Cardiovascular: Negative for chest pain and palpitations.  Gastrointestinal: Positive for abdominal pain. Negative for constipation, diarrhea and nausea.  Genitourinary: Negative for difficulty urinating, dysuria, frequency and urgency.  Musculoskeletal: Negative for back pain, neck pain and neck stiffness.       See hpi.  Skin: Negative for rash.  Hematological: Negative for adenopathy. Does not bruise/bleed easily.  Psychiatric/Behavioral: Negative for behavioral problems and confusion.    Past Medical History:  Diagnosis Date   Acute blood loss anemia 01/06/2016   Anemia    Diabetes mellitus without complication ([place]) 6644   Gestational diabetes only.   Dysrhythmia    Gestational diabetes    2nd preg only   Headache(784.0)    otc med prn   Heartburn in pregnancy    Maternal iron deficiency anemia 01/06/2016   MS (multiple sclerosis)  ([facility])    Postpartum care following cesarean delivery (5/11) 01/05/2016   Postpartum hypertension 03/07/2019   POTS (postural orthostatic tachycardia syndrome)    SVT (supraventricular tachycardia) ([facility])    No problems since 10/2012 - no med needed   Vaginal Pap smear, abnormal    Vitamin deficiency      Social History   Socioeconomic History   Marital status: Married    Spouse name: Nedda Gains   Number of children: 4   Years of education: Not on file   Highest education level: Not on file  Occupational History   Occupation: [employer] nurse  Tobacco Use   Smoking status: Never Smoker   Smokeless tobacco: Never Used  Vaping Use   Vaping Use: Never used  Substance and Sexual Activity   Alcohol use: No   Drug use: No   Sexual activity: Yes    Birth control/protection: Pill    Comment: pregnant  Other Topics Concern   Not on file  Social History Narrative   Soda daily   Right handed    Lives with husband and 4 kids   She is ER Marine scientist       Social Determinants of Health   Financial Resource Strain:    Difficulty of Paying Living Expenses:   Food Insecurity:    Worried About [place] in the Last Year:    [place] in the Last Year:   Transportation Needs:  Lack of Transportation (Medical):    Lack of Transportation (Non-Medical):   Physical Activity:    Days of Exercise per Week:    Minutes of Exercise per Session:   Stress:    Feeling of Stress :   Social Connections:    Frequency of Communication with Friends and Family:    Frequency of Social Gatherings with Friends and Family:    Attends Religious Services:    Active Member of Clubs or Organizations:    Attends Music therapist:    Marital Status:   Intimate Partner Violence:    Fear of Current or Ex-Partner:    Emotionally Abused:    Physically Abused:    Sexually Abused:     Past Surgical History:  Procedure Laterality Date    ATRIAL ABLATION SURGERY  2003   FAILED   CESAREAN SECTION  2006   Breech   CESAREAN SECTION  2006   breech   CESAREAN SECTION N/A 12/19/2013   Procedure: REPEAT CESAREAN SECTION;  Surgeon: Lovenia Kim, MD;  Location: [facility];  Service: Obstetrics;  Laterality: N/A;  EDD: 12/25/13   CESAREAN SECTION N/A 01/05/2016   Procedure: Repeat CESAREAN SECTION;  Surgeon: Brien Few, MD;  Location: [facility];  Service: Obstetrics;  Laterality: N/A;  EDD: 01/12/16    CESAREAN SECTION WITH BILATERAL TUBAL LIGATION Bilateral 02/25/2019   Procedure: Repeat CESAREAN SECTION WITH BILATERAL TUBAL LIGATION;  Surgeon: Brien Few, MD;  Location: [facility];  Service: Obstetrics;  Laterality: Bilateral;  [place]ather, RNFA  EDD: 03/04/19 Allergy: Adhesive, Paxil, Cipro   CHOLECYSTECTOMY N/A 08/13/2019   Procedure: LAPAROSCOPIC CHOLECYSTECTOMY WITH  CHOLANGIOGRAM;  Surgeon: Greer Pickerel, MD;  Location: [facility];  Service: General;  Laterality: N/A;   COLPOSCOPY     WISDOM TOOTH EXTRACTION      Family History  Problem Relation Age of Onset   Multiple sclerosis Mother    Migraines Mother    Emphysema Father    Heart disease Father    Migraines Father    Migraines Sister    Spina bifida Brother        half brother   Cancer Paternal Grandfather        LUNG   Diabetes Maternal Grandmother     Allergies  Allergen Reactions   Ciprofloxacin Rash and Other (See Comments)    Has patient had a PCN reaction causing immediate rash, facial/tongue/throat swelling, SOB or lightheadedness with hypotension: No Has patient had a PCN reaction causing severe rash involving mucus membranes or skin necrosis: No Has patient had a PCN reaction that required hospitalization No Has patient had a PCN reaction occurring within the last 10 years: Yes If all of the above answers are "NO", then may proceed with Cephalosporin use.    Current Outpatient Medications on File Prior to Visit  Medication Sig  Dispense Refill   acetaminophen (TYLENOL) 500 MG tablet Take 1,000 mg by mouth every 6 (six) hours as needed for mild pain, moderate pain or headache. (Patient not taking: Reported on 03/23/2020)     cephALEXin (KEFLEX) 500 MG capsule Take 500 mg by mouth 2 (two) times daily. (Patient not taking: Reported on 03/23/2020)     NIFEdipine (PROCARDIA XL/NIFEDICAL XL) 60 MG 24 hr tablet Take 1 tablet (60 mg total) by mouth daily. (Patient not taking: Reported on 03/23/2020) 30 tablet 5   nitrofurantoin, macrocrystal-monohydrate, (MACROBID) 100 MG capsule Take 1 capsule (100 mg total) by mouth 2 (two) times daily. (Patient not  taking: Reported on 03/23/2020) 14 capsule 0   oxyCODONE (OXY IR/ROXICODONE) 5 MG immediate release tablet Take 1 tablet (5 mg total) by mouth every 6 (six) hours as needed for moderate pain. (Patient not taking: Reported on 03/23/2020) 20 tablet 0   No current facility-administered medications on file prior to visit.    BP 110/80    Pulse (!) 114    Temp 98.1 F (36.7 C) (Oral)    Resp 18    Ht 5\' 11"  (1.803 m)    Wt 145 lb 6.4 oz (66 kg)    LMP 03/08/2020 (Approximate)    SpO2 98%    BMI 20.28 kg/m       Objective:   Physical Exam  General Mental Status- Alert. General Appearance- Not in acute distress.   Skin General: Color- Normal Color. Moisture- Normal Moisture.  Neck Carotid Arteries- Normal color. Moisture- Normal Moisture. No carotid bruits. No JVD.  On palpation of neck might have borderline thyromegaly.  Chest and Lung Exam Auscultation: Breath Sounds:-Normal.  Cardiovascular Auscultation:Rythm- Regular. Murmurs & Other Heart Sounds:Auscultation of the heart reveals- No Murmurs.  Abdomen Inspection:-Inspeection Normal. Palpation/Percussion:Note: Soft, nondistended, positive bowel sounds no rebound or guarding faint right lower quadrant tender.  Back-no CVA tenderness.   Neurologic Cranial Nerve exam:- CN III-XII intact(No nystagmus), symmetric  smile. Strength:- 5/5 equal and symmetric strength both upper and lower extremities.      Assessment & Plan:  For you wellness exam today I have ordered cbc, cmp, tsh,t4, b12, b1 and lipid panel.  Vaccine up to date.  Recommend exercise and healthy diet.  We will let you know lab results as they come in.  For fatigue added b12, b1, tsh and t4.  For mild borderline enlarged thyroid get US thyroid.  For llq pain rx augmentin and follow cbc. Possible diverticulitis will see how you do on augmentin.  Want you to get smart watch and monitor. Want to see if majority of time over 100 before deciding on potential repeat former work up.  Follow up date appointment will be determined after lab review.   In addition to wellness exam is planned 15 additional minutes discussing tachycardia, possible thyromegaly, fatigue and abdomen pain.

## 2020-03-25 ENCOUNTER — Other Ambulatory Visit: Payer: Self-pay

## 2020-03-25 ENCOUNTER — Other Ambulatory Visit (INDEPENDENT_AMBULATORY_CARE_PROVIDER_SITE_OTHER): Payer: No Typology Code available for payment source

## 2020-03-25 DIAGNOSIS — R5383 Other fatigue: Secondary | ICD-10-CM | POA: Diagnosis not present

## 2020-03-25 LAB — VITAMIN B12: Vitamin B-12: 248 pg/mL (ref 211–911)

## 2020-03-25 LAB — T4, FREE: Free T4: 0.88 ng/dL (ref 0.60–1.60)

## 2020-03-25 LAB — TSH: TSH: 1.54 u[IU]/mL (ref 0.35–4.50)

## 2020-03-25 NOTE — Addendum Note (Signed)
Addended by: Caffie Pinto on: 03/25/2020 09:13 AM   Modules accepted: Orders

## 2020-03-29 ENCOUNTER — Encounter: Payer: Self-pay | Admitting: Medical

## 2020-03-30 ENCOUNTER — Other Ambulatory Visit: Payer: Self-pay | Admitting: Emergency Medicine

## 2020-03-30 ENCOUNTER — Other Ambulatory Visit: Payer: No Typology Code available for payment source

## 2020-03-30 ENCOUNTER — Telehealth: Payer: Self-pay | Admitting: *Deleted

## 2020-03-30 DIAGNOSIS — Z0000 Encounter for general adult medical examination without abnormal findings: Secondary | ICD-10-CM

## 2020-03-30 NOTE — Progress Notes (Signed)
cbc

## 2020-03-30 NOTE — Telephone Encounter (Signed)
-----   Message from Mackie Pai, PA-C sent at 03/29/2020 10:16 PM EDT ----- Pt had wellness exam the other day./last week. I thought I had put in cmp, cbc and lipid panel. On review I did not. See labs that were drawn that day. Can those be added. If not then can she be scheduled to come in thursay or Friday. My bad so we accommodate her this week since I made the mistake.  Please let me know.  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  Thanks, Percell Miller

## 2020-03-30 NOTE — Telephone Encounter (Signed)
Spoke with Roger Kill at [facility]. She states specimen previously sent was a gold tube and they were able to send it to [facility] for additional testing. Future CMP, Lipid panel orders were cancelled. Will draw an extra gold tube at upcoming lab appt just in case additional testing is needed.

## 2020-03-30 NOTE — Telephone Encounter (Signed)
Add on request has been faxed to [facility] to see if they can forward specimen to [facility] for testing for CMP, Lipid panel.  Pt will need lab appt for CBC.

## 2020-03-31 ENCOUNTER — Other Ambulatory Visit: Payer: No Typology Code available for payment source

## 2020-03-31 LAB — LIPID PANEL
Cholesterol: 158 mg/dL (ref <200)
HDL: 56 mg/dL (ref >=50)
LDL Cholesterol (Calc): 88 mg/dL (calc)
Non-HDL Cholesterol (Calc): 102 mg/dL (calc) (ref <130)
Total CHOL/HDL Ratio: 2.8 (calc) (ref <5.0)
Triglycerides: 54 mg/dL (ref <150)

## 2020-03-31 LAB — VITAMIN B1: Vitamin B1 (Thiamine): 9 nmol/L (ref 8–30)

## 2020-03-31 LAB — COMPREHENSIVE METABOLIC PANEL
AG Ratio: 1.4 (calc) (ref 1.0–2.5)
ALT: 12 U/L (ref 6–29)
AST: 19 U/L (ref 10–30)
Albumin: 4 g/dL (ref 3.6–5.1)
Alkaline phosphatase (APISO): 53 U/L (ref 31–125)
BUN/Creatinine Ratio: 8 (calc) (ref 6–22)
BUN: 6 mg/dL — ABNORMAL LOW (ref 7–25)
CO2: 22 mmol/L (ref 20–32)
Calcium: 8.9 mg/dL (ref 8.6–10.2)
Chloride: 103 mmol/L (ref 98–110)
Creat: 0.78 mg/dL (ref 0.50–1.10)
Globulin: 2.8 g/dL (calc) (ref 1.9–3.7)
Glucose, Bld: 55 mg/dL — ABNORMAL LOW (ref 65–99)
Potassium: 5 mmol/L (ref 3.5–5.3)
Sodium: 138 mmol/L (ref 135–146)
Total Bilirubin: 0.3 mg/dL (ref 0.2–1.2)
Total Protein: 6.8 g/dL (ref 6.1–8.1)

## 2020-04-05 ENCOUNTER — Other Ambulatory Visit (INDEPENDENT_AMBULATORY_CARE_PROVIDER_SITE_OTHER): Payer: No Typology Code available for payment source

## 2020-04-05 ENCOUNTER — Ambulatory Visit (HOSPITAL_BASED_OUTPATIENT_CLINIC_OR_DEPARTMENT_OTHER)
Admission: RE | Admit: 2020-04-05 | Discharge: 2020-04-05 | Disposition: A | Discharge status: Home / Self Care | Payer: No Typology Code available for payment source | Source: Ambulatory Visit | Attending: Medical | Admitting: Medical

## 2020-04-05 ENCOUNTER — Other Ambulatory Visit: Payer: Self-pay

## 2020-04-05 DIAGNOSIS — E010 Iodine-deficiency related diffuse (endemic) goiter: Secondary | ICD-10-CM | POA: Diagnosis not present

## 2020-04-05 DIAGNOSIS — Z0000 Encounter for general adult medical examination without abnormal findings: Secondary | ICD-10-CM | POA: Diagnosis not present

## 2020-04-05 LAB — CBC WITH DIFFERENTIAL/PLATELET
Basophils Absolute: 0 10*3/uL (ref 0.0–0.1)
Basophils Relative: 0.4 % (ref 0.0–3.0)
Eosinophils Absolute: 0.1 10*3/uL (ref 0.0–0.7)
Eosinophils Relative: 2 % (ref 0.0–5.0)
HCT: 37.5 % (ref 36.0–46.0)
Hemoglobin: 13.1 g/dL (ref 12.0–15.0)
Lymphocytes Relative: 34.8 % (ref 12.0–46.0)
Lymphs Abs: 1.9 10*3/uL (ref 0.7–4.0)
MCHC: 34.8 g/dL (ref 30.0–36.0)
MCV: 92.6 fl (ref 78.0–100.0)
Monocytes Absolute: 0.4 10*3/uL (ref 0.1–1.0)
Monocytes Relative: 7.1 % (ref 3.0–12.0)
Neutro Abs: 3 10*3/uL (ref 1.4–7.7)
Neutrophils Relative %: 55.7 % (ref 43.0–77.0)
Platelets: 217 10*3/uL (ref 150.0–400.0)
RBC: 4.04 Mil/uL (ref 3.87–5.11)
RDW: 12.5 % (ref 11.5–15.5)
WBC: 5.3 10*3/uL (ref 4.0–10.5)

## 2020-08-22 ENCOUNTER — Other Ambulatory Visit (HOSPITAL_BASED_OUTPATIENT_CLINIC_OR_DEPARTMENT_OTHER): Payer: Self-pay

## 2020-08-22 MED FILL — ONDANSETRON ODT 4 MG TABLET: 4 | 3 days supply | Qty: 9 | Fill #0

## 2020-08-22 MED FILL — NITROFURANTOIN MONO-MCR 100: 100 | 5 days supply | Qty: 10 | Fill #0

## 2020-09-25 ENCOUNTER — Other Ambulatory Visit (HOSPITAL_BASED_OUTPATIENT_CLINIC_OR_DEPARTMENT_OTHER): Payer: Self-pay | Admitting: Emergency Medicine

## 2020-09-26 MED FILL — AZITHROMYCIN 250 MG TABLET: 250 | 5 days supply | Qty: 6 | Fill #0

## 2020-11-30 ENCOUNTER — Encounter: Payer: Self-pay | Admitting: Medical

## 2020-11-30 ENCOUNTER — Other Ambulatory Visit (HOSPITAL_BASED_OUTPATIENT_CLINIC_OR_DEPARTMENT_OTHER): Payer: Self-pay

## 2020-11-30 ENCOUNTER — Telehealth: Payer: Self-pay | Admitting: Medical

## 2020-11-30 MED ORDER — ONDANSETRON 4 MG PO TBDP
4.0000 mg | ORAL_TABLET | Freq: Three times a day (TID) | ORAL | 0 refills | Status: DC | PRN
Start: 2020-11-30 — End: 2021-09-19
  Filled 2020-11-30 – 2020-12-12 (×2): qty 20, 7d supply, fill #0

## 2020-11-30 NOTE — Telephone Encounter (Signed)
Rx zofran sent to pharamcy. See my chart message pt sent me.

## 2020-12-08 ENCOUNTER — Other Ambulatory Visit (HOSPITAL_BASED_OUTPATIENT_CLINIC_OR_DEPARTMENT_OTHER): Payer: Self-pay

## 2020-12-12 ENCOUNTER — Other Ambulatory Visit (HOSPITAL_BASED_OUTPATIENT_CLINIC_OR_DEPARTMENT_OTHER): Payer: Self-pay

## 2021-03-09 ENCOUNTER — Telehealth: Payer: Self-pay | Admitting: Medical

## 2021-03-09 ENCOUNTER — Other Ambulatory Visit (HOSPITAL_BASED_OUTPATIENT_CLINIC_OR_DEPARTMENT_OTHER): Payer: Self-pay

## 2021-03-09 ENCOUNTER — Ambulatory Visit: Payer: 59 | Admitting: Medical

## 2021-03-09 ENCOUNTER — Ambulatory Visit (HOSPITAL_BASED_OUTPATIENT_CLINIC_OR_DEPARTMENT_OTHER)
Admission: RE | Admit: 2021-03-09 | Discharge: 2021-03-09 | Disposition: A | Discharge status: Home / Self Care | Payer: 59 | Source: Ambulatory Visit | Attending: Medical | Admitting: Medical

## 2021-03-09 ENCOUNTER — Other Ambulatory Visit: Payer: Self-pay

## 2021-03-09 VITALS — BP 110/75 | HR 87 | Temp 98.7°F | Resp 18 | Ht 71.0 in | Wt 154.0 lb

## 2021-03-09 DIAGNOSIS — R300 Dysuria: Secondary | ICD-10-CM | POA: Diagnosis not present

## 2021-03-09 DIAGNOSIS — M25559 Pain in unspecified hip: Secondary | ICD-10-CM | POA: Diagnosis not present

## 2021-03-09 DIAGNOSIS — M25552 Pain in left hip: Secondary | ICD-10-CM | POA: Diagnosis not present

## 2021-03-09 DIAGNOSIS — N390 Urinary tract infection, site not specified: Secondary | ICD-10-CM

## 2021-03-09 LAB — POC URINALSYSI DIPSTICK (AUTOMATED)
Bilirubin, UA: NEGATIVE
Blood, UA: POSITIVE
Glucose, UA: NEGATIVE
Ketones, UA: NEGATIVE
Nitrite, UA: POSITIVE
Protein, UA: NEGATIVE
Spec Grav, UA: 1.015 (ref 1.010–1.025)
Urobilinogen, UA: 0.2 E.U./dL
pH, UA: 6 (ref 5.0–8.0)

## 2021-03-09 MED ORDER — NITROFURANTOIN MONOHYD MACRO 100 MG PO CAPS
100.0000 mg | ORAL_CAPSULE | Freq: Two times a day (BID) | ORAL | 0 refills | Status: DC
  Filled 2021-03-09: qty 14, 7d supply, fill #0

## 2021-03-09 NOTE — Patient Instructions (Addendum)
You appear to have a urinary tract infection. I am prescribing  macrobid antibiotic for the probable infection.  Azostandard for urinary pain. Hydrate well. I am sending out a urine culture. During the interim if your signs and symptoms worsen rather than improving please notify us. We will notify your when the culture results are back.  For hip pain get hip xray today. Then decide on referral to sports med MD.  Follow up in 7 days or as needed.

## 2021-03-09 NOTE — Progress Notes (Signed)
Subjective:    Patient ID: Karen Powers, female    DOB: [birth year], 40 y.o.   MRN: [id number]  HPI  Pt in today reporting urinary symptoms since tuesday  Dysuria- yes Frequent urination-yes Hesitancy- Suprapubic pressure-yes Fever-no chills-no Nausea-mild Vomiting-no CVA pain-no History of UTI-yes Gross hematuria- no   Lmp- 03-02-2021.   Pt also reports some left hip pain. Pain for  6 months. Was on and off in the past. But now more constant. No med for pain. Points more toward groin/medial hip area.    Review of Systems  Constitutional:  Negative for chills, fatigue and fever.  Respiratory:  Negative for chest tightness, shortness of breath and wheezing.   Cardiovascular:  Negative for chest pain and palpitations.  Gastrointestinal:  Negative for abdominal pain.  Genitourinary:  Positive for dysuria and frequency.  Musculoskeletal:  Negative for back pain.  Skin:  Negative for rash.  Hematological:  Negative for adenopathy. Does not bruise/bleed easily.  Psychiatric/Behavioral:  Negative for behavioral problems and confusion.     Past Medical History:  Diagnosis Date   Acute blood loss anemia 01/06/2016   Anemia    Diabetes mellitus without complication ([place]) 3329   Gestational diabetes only.   Dysrhythmia    Gestational diabetes    2nd preg only   Headache(784.0)    otc med prn   Heartburn in pregnancy    Maternal iron deficiency anemia 01/06/2016   MS (multiple sclerosis) (HCC)    Postpartum care following cesarean delivery (5/11) 01/05/2016   Postpartum hypertension 03/07/2019   POTS (postural orthostatic tachycardia syndrome)    SVT (supraventricular tachycardia) ([place])    No problems since 10/2012 - no med needed   Vaginal Pap smear, abnormal    Vitamin deficiency      Social History   Socioeconomic History   Marital status: Married    Spouse name: Vassie Kugel   Number of children: 4   Years of education: Not on file   Highest education  level: Not on file  Occupational History   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  Tobacco Use   Smoking status: Never   Smokeless tobacco: Never  Vaping Use   Vaping Use: Never used  Substance and Sexual Activity   Alcohol use: No   Drug use: No   Sexual activity: Yes    Birth control/protection: Pill    Comment: pregnant  Other Topics Concern   Not on file  Social History Narrative   Soda daily   Right handed    Lives with husband and 4 kids   She is ER nurse       Social Determinants of Health   Financial Resource Strain: Not on file  Food Insecurity: Not on file  Transportation Needs: Not on file  Physical Activity: Not on file  Stress: Not on file  Social Connections: Not on file  Intimate Partner Violence: Not on file    Past Surgical History:  Procedure Laterality Date   [place]  2006   Breech   CESAREAN SECTION  2006   breech   CESAREAN SECTION N/A 12/19/2013   Procedure: REPEAT CESAREAN SECTION;  Surgeon: Lovenia Kim, MD;  Location: [facility];  Service: Obstetrics;  Laterality: N/A;  EDD: 12/25/13   CESAREAN SECTION N/A 01/05/2016   Procedure: Repeat CESAREAN SECTION;  Surgeon: Brien Few, MD;  Location: [facility];  Service: Obstetrics;  Laterality: N/A;  EDD: 01/12/16    CESAREAN SECTION WITH BILATERAL TUBAL LIGATION Bilateral 02/25/2019   Procedure: Repeat CESAREAN SECTION WITH BILATERAL TUBAL LIGATION;  Surgeon: Brien Few, MD;  Location: [facility];  Service: Obstetrics;  Laterality: Bilateral;  [place], RNFA  EDD: 03/04/19 Allergy: Adhesive, Paxil, Cipro   CHOLECYSTECTOMY N/A 08/13/2019   Procedure: LAPAROSCOPIC CHOLECYSTECTOMY WITH  CHOLANGIOGRAM;  Surgeon: Greer Pickerel, MD;  Location: [facility];  Service: General;  Laterality: N/A;   COLPOSCOPY     WISDOM TOOTH EXTRACTION      Family History  Problem Relation Age of Onset   Multiple sclerosis Mother    Migraines Mother    Emphysema Father     Heart disease Father    Migraines Father    Migraines Sister    Spina bifida Brother        half brother   Cancer Paternal Grandfather        LUNG   Diabetes Maternal Grandmother     Allergies  Allergen Reactions   Ciprofloxacin Rash and Other (See Comments)    Has patient had a PCN reaction causing immediate rash, facial/tongue/throat swelling, SOB or lightheadedness with hypotension: No Has patient had a PCN reaction causing severe rash involving mucus membranes or skin necrosis: No Has patient had a PCN reaction that required hospitalization No Has patient had a PCN reaction occurring within the last 10 years: Yes If all of the above answers are "NO", then may proceed with Cephalosporin use.    Current Outpatient Medications on File Prior to Visit  Medication Sig Dispense Refill   acetaminophen (TYLENOL) 500 MG tablet Take 1,000 mg by mouth every 6 (six) hours as needed for mild pain, moderate pain or headache. (Patient not taking: Reported on 03/23/2020)     ondansetron (ZOFRAN ODT) 4 MG disintegrating tablet Take 1 tablet (4 mg total) by mouth every 8 (eight) hours as needed for nausea or vomiting. 20 tablet 0   ondansetron (ZOFRAN-ODT) 4 MG disintegrating tablet DISSOLVE 1 TABLET UNDER THE TONGUE 3 TIMES DAILY AS NEEDED FOR NAUSEA AND VOMITING 9 tablet 0   No current facility-administered medications on file prior to visit.    BP 110/75   Pulse 87   Temp 98.7 F (37.1 C)   Resp 18   Ht 5\' 11"  (1.803 m)   Wt 154 lb (69.9 kg)   LMP 03/02/2021   SpO2 99%   BMI 21.48 kg/m         Objective:   Physical Exam  General Mental Status- Alert. General Appearance- Not in acute distress.   Skin General: Color- Normal Color. Moisture- Normal Moisture.  Neck Carotid Arteries- Normal color. Moisture- Normal Moisture. No carotid bruits. No JVD.  Chest and Lung Exam Auscultation: Breath Sounds:-Normal.  Cardiovascular Auscultation:Rythm- Regular. Murmurs & Other  Heart Sounds:Auscultation of the heart reveals- No Murmurs.  Abdomen Inspection:-Inspeection Normal. Palpation/Percussion:Note:No mass. Palpation and Percussion of the abdomen reveal- Non Tender, Non Distended + BS, no rebound or guarding.  Neurologic Cranial Nerve exam:- CN III-XII intact(No nystagmus), symmetric smile. Strength:- 5/5 equal and symmetric strength both upper and lower extremities.   Left hip- good rom. No crepitus. No pain on palpation lateral aspect. But some pain in left grin area on hip rom.    Assessment & Plan:  You appear to have a urinary tract infection. I am prescribing  macrobid antibiotic for the probable infection.  Azostandard for urinary pain. Hydrate well. I am sending out a urine culture. During the interim  if your signs and symptoms worsen rather than improving please notify us. We will notify your when the culture results are back.   For hip pain get hip xray today. Then decide on referral to sports med MD.  Follow up in 7 days or as needed.   Mackie Pai, PA-C

## 2021-03-09 NOTE — Telephone Encounter (Signed)
Opened to review 

## 2021-03-11 LAB — URINE CULTURE
MICRO NUMBER:: 12119337
SPECIMEN QUALITY:: ADEQUATE

## 2021-03-14 ENCOUNTER — Encounter: Payer: Self-pay | Admitting: Medical

## 2021-03-14 DIAGNOSIS — M25559 Pain in unspecified hip: Secondary | ICD-10-CM

## 2021-03-14 NOTE — Telephone Encounter (Signed)
Referral placed to sports medicine

## 2021-03-22 ENCOUNTER — Ambulatory Visit: Payer: 59 | Admitting: Sports Medicine

## 2021-03-22 ENCOUNTER — Other Ambulatory Visit (HOSPITAL_BASED_OUTPATIENT_CLINIC_OR_DEPARTMENT_OTHER): Payer: Self-pay

## 2021-03-22 ENCOUNTER — Other Ambulatory Visit: Payer: Self-pay

## 2021-03-22 DIAGNOSIS — G8929 Other chronic pain: Secondary | ICD-10-CM | POA: Insufficient documentation

## 2021-03-22 DIAGNOSIS — M25552 Pain in left hip: Secondary | ICD-10-CM | POA: Diagnosis not present

## 2021-03-22 MED ORDER — MELOXICAM 15 MG PO TABS
ORAL_TABLET | ORAL | 3 refills | Status: DC
  Filled 2021-03-22: qty 30, 30d supply, fill #0

## 2021-03-22 NOTE — Progress Notes (Signed)
    Procedures performed today:    None.  Independent interpretation of notes and tests performed by another provider:   Hip x-rays personally reviewed, they were unremarkable, I did review a abdominal and pelvic CT from about 3-1/2 years ago and I do see some spurring at the femoral head as well as subchondral sclerosis of the weightbearing portion of the acetabulum.  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 For the past 6 months she has had a chronic indolent pain in the left hip that she localizes anteriorly in the groin, worse with certain movements such as twisting. She has tried NSAIDs, she did see her PCP who obtained some x-rays which were unremarkable. She is referred to me for further evaluation and definitive treatment. She has the positive C sign, she has pain with internal rotation of the left hip, pain with adduction and flexion as well. Internal rotation is limited left side compared to right as well. I do suspect she has a hip labral tear. She may also have a touch of hip osteoarthritis. Switching from ibuprofen to meloxicam, adding hip conditioning exercises. Considering duration of symptoms I do think we need an MR arthrogram, this could be diagnostic and therapeutic. Return to see me for arthrogram injection.    ___________________________________________ Gwen Her. Dianah Field, M.D., ABFM., CAQSM. Primary Care and [place] Instructor of [place] [facility]

## 2021-03-22 NOTE — Assessment & Plan Note (Signed)
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 For the past 6 months she has had a chronic indolent pain in the left hip that she localizes anteriorly in the groin, worse with certain movements such as twisting. She has tried NSAIDs, she did see her PCP who obtained some x-rays which were unremarkable. She is referred to me for further evaluation and definitive treatment. She has the positive C sign, she has pain with internal rotation of the left hip, pain with adduction and flexion as well. Internal rotation is limited left side compared to right as well. I do suspect she has a hip labral tear. She may also have a touch of hip osteoarthritis. Switching from ibuprofen to meloxicam, adding hip conditioning exercises. Considering duration of symptoms I do think we need an MR arthrogram, this could be diagnostic and therapeutic. Return to see me for arthrogram injection.

## 2021-03-27 ENCOUNTER — Other Ambulatory Visit: Payer: Self-pay

## 2021-03-27 ENCOUNTER — Ambulatory Visit (INDEPENDENT_AMBULATORY_CARE_PROVIDER_SITE_OTHER): Payer: 59 | Admitting: Sports Medicine

## 2021-03-27 ENCOUNTER — Ambulatory Visit (INDEPENDENT_AMBULATORY_CARE_PROVIDER_SITE_OTHER): Payer: 59

## 2021-03-27 ENCOUNTER — Ambulatory Visit: Payer: 59 | Admitting: Sports Medicine

## 2021-03-27 DIAGNOSIS — M25552 Pain in left hip: Secondary | ICD-10-CM

## 2021-03-27 DIAGNOSIS — G8929 Other chronic pain: Secondary | ICD-10-CM

## 2021-03-27 DIAGNOSIS — M25452 Effusion, left hip: Secondary | ICD-10-CM | POA: Diagnosis not present

## 2021-03-27 MED ORDER — GADOBUTROL 1 MMOL/ML IV SOLN
1.0000 mL | Freq: Once | INTRAVENOUS | Status: AC | PRN
  Administered 2021-03-27: 1 mL

## 2021-03-27 NOTE — Progress Notes (Signed)
    Procedures performed today:    Procedure: Real-time Ultrasound Guided gadolinium contrast injection of left hip joint Device: Samsung HS60  Verbal informed consent obtained.  Time-out conducted.  Noted no overlying erythema, induration, or other signs of local infection.  Skin prepped in a sterile fashion.  Local anesthesia: Topical Ethyl chloride.  With sterile technique and under real time ultrasound guidance: Noted normal-appearing hip capsule, 22-gauge spinal needle advanced to the femoral head/neck junction, contacted bone and then injected 1 cc kenalog 40, 2 cc lidocaine, 2 cc bupivacaine, syringe again switched and 0.1 cc gadolinium injected, syringe switched and 10 cc sterile saline used to distend the joint. Joint visualized and capsule seen distending confirming intra-articular placement of contrast material and medication. Completed without difficulty  Advised to call if fevers/chills, erythema, induration, drainage, or persistent bleeding.  Images permanently stored in PACS Impression: Technically successful ultrasound guided gadolinium contrast injection for MR arthrography.  Please see separate MR arthrogram report.  Independent interpretation of notes and tests performed by another provider:   None.  Brief History, Exam, Impression, and Recommendations:    Chronic left hip pain Please see prior notes, today we did an injection for the MR arthrogram.    ___________________________________________ Gwen Her. Dianah Field, M.D., ABFM., CAQSM. Primary Care and [place] Instructor of [place] of [facility]

## 2021-03-27 NOTE — Assessment & Plan Note (Signed)
Please see prior notes, today we did an injection for the MR arthrogram.

## 2021-04-04 ENCOUNTER — Encounter: Payer: 59 | Admitting: Medical

## 2021-06-06 ENCOUNTER — Encounter (HOSPITAL_COMMUNITY): Payer: Self-pay

## 2021-06-06 ENCOUNTER — Emergency Department (HOSPITAL_COMMUNITY)
Admission: EM | Admit: 2021-06-06 | Discharge: 2021-06-06 | Disposition: A | Discharge status: Home / Self Care | Payer: 59 | Attending: Emergency Medicine | Admitting: Emergency Medicine

## 2021-06-06 ENCOUNTER — Other Ambulatory Visit: Payer: Self-pay

## 2021-06-06 ENCOUNTER — Other Ambulatory Visit (HOSPITAL_BASED_OUTPATIENT_CLINIC_OR_DEPARTMENT_OTHER): Payer: Self-pay

## 2021-06-06 ENCOUNTER — Emergency Department (HOSPITAL_COMMUNITY): Payer: 59

## 2021-06-06 DIAGNOSIS — R000 Tachycardia, unspecified: Secondary | ICD-10-CM | POA: Diagnosis not present

## 2021-06-06 DIAGNOSIS — N9489 Other specified conditions associated with female genital organs and menstrual cycle: Secondary | ICD-10-CM | POA: Diagnosis not present

## 2021-06-06 DIAGNOSIS — Z79899 Other long term (current) drug therapy: Secondary | ICD-10-CM | POA: Diagnosis not present

## 2021-06-06 DIAGNOSIS — E876 Hypokalemia: Secondary | ICD-10-CM | POA: Diagnosis not present

## 2021-06-06 DIAGNOSIS — R002 Palpitations: Secondary | ICD-10-CM

## 2021-06-06 DIAGNOSIS — R42 Dizziness and giddiness: Secondary | ICD-10-CM | POA: Diagnosis not present

## 2021-06-06 DIAGNOSIS — I10 Essential (primary) hypertension: Secondary | ICD-10-CM | POA: Diagnosis not present

## 2021-06-06 LAB — BASIC METABOLIC PANEL
Anion gap: 10 (ref 5–15)
BUN: 7 mg/dL (ref 6–20)
CO2: 20 mmol/L — ABNORMAL LOW (ref 22–32)
Calcium: 8.3 mg/dL — ABNORMAL LOW (ref 8.9–10.3)
Chloride: 107 mmol/L (ref 98–111)
Creatinine, Ser: 0.74 mg/dL (ref 0.44–1.00)
GFR, Estimated: >60 mL/min (ref >60)
Glucose, Bld: 122 mg/dL — ABNORMAL HIGH (ref 70–99)
Potassium: 3.4 mmol/L — ABNORMAL LOW (ref 3.5–5.1)
Sodium: 137 mmol/L (ref 135–145)

## 2021-06-06 LAB — I-STAT BETA HCG BLOOD, ED (MC, WL, AP ONLY): I-stat hCG, quantitative: <5 m[IU]/mL (ref <5)

## 2021-06-06 LAB — CBC
HCT: 37.5 % (ref 36.0–46.0)
Hemoglobin: 12.9 g/dL (ref 12.0–15.0)
MCH: 31.4 pg (ref 26.0–34.0)
MCHC: 34.4 g/dL (ref 30.0–36.0)
MCV: 91.2 fL (ref 80.0–100.0)
Platelets: 183 10*3/uL (ref 150–400)
RBC: 4.11 MIL/uL (ref 3.87–5.11)
RDW: 12.1 % (ref 11.5–15.5)
WBC: 4.2 10*3/uL (ref 4.0–10.5)
nRBC: 0 % (ref 0.0–0.2)

## 2021-06-06 LAB — MAGNESIUM: Magnesium: 2.1 mg/dL (ref 1.7–2.4)

## 2021-06-06 LAB — TROPONIN I (HIGH SENSITIVITY): Troponin I (High Sensitivity): <2 ng/L (ref <18)

## 2021-06-06 LAB — D-DIMER, QUANTITATIVE: D-Dimer, Quant: <0.27 ug/mL-FEU (ref 0.00–0.50)

## 2021-06-06 MED ORDER — POTASSIUM CHLORIDE ER 10 MEQ PO TBCR
10.0000 meq | EXTENDED_RELEASE_TABLET | Freq: Every day | ORAL | 0 refills | Status: DC
  Filled 2021-06-06: qty 30, 30d supply, fill #0

## 2021-06-06 MED ORDER — METOPROLOL TARTRATE 25 MG PO TABS
25.0000 mg | ORAL_TABLET | Freq: Once | ORAL | Status: AC
  Administered 2021-06-06: 25 mg via ORAL
  Filled 2021-06-06: qty 1

## 2021-06-06 MED ORDER — METOPROLOL TARTRATE 25 MG PO TABS
25.0000 mg | ORAL_TABLET | Freq: Two times a day (BID) | ORAL | 1 refills | Status: DC
  Filled 2021-06-06: qty 60, 30d supply, fill #0

## 2021-06-06 NOTE — ED Notes (Signed)
Pt in bed, pt offers no complaints at this time. Sig other at bedside

## 2021-06-06 NOTE — ED Triage Notes (Signed)
Pt to er via ems per ems pt is here for palpitations and rapid heart rate, pt states that she walked her kids to school this am and when she got back she felt dizzy and sat down, states that she felt hot like she was going to pass out, states that then she started having some palpitation and felt like her heart was racing, states that she used a pulse ox and her heart was 165ish.

## 2021-06-06 NOTE — ED Notes (Signed)
Pt in bed, sig other at bedside, pt denies pain, pt states that she is ready to go home.

## 2021-06-06 NOTE — Discharge Instructions (Addendum)
Please follow-up with your cardiologist office tomorrow (Dr Einar Gip).  I started you back on metoprolol 25 mg twice a day.  You should check your heart rate when you wake up in the morning and again in the evening, and keep a daily log.  I also prescribed potassium supplements to take for the next 30 days for some mild hypokalemia, and advised that you take your Tums at home 500-750 mg daily for the next 14 days for your low calcium.

## 2021-06-06 NOTE — ED Provider Notes (Signed)
[facility] EMERGENCY DEPARTMENT Provider Note   CSN: [id number] Arrival date & time: 06/06/21  0850     History Chief Complaint  Patient presents with   Palpitations    Karen Powers is a 40 y.o. female with history of multiple sclerosis, SVT, presenting to the emergency department with palpitations.  The patient reports that she had some congestion about a week ago, that her son was sick with similar URI type symptoms 3 weeks ago.  He tested negative for COVID multiple times at home with home testing kits.  She reports that this morning when she was preparing the kids for school, she abruptly began to feel hot, flushed, palpitations, lightheadedness, like she was going to pass out.  She checked her home oxygen and heart rate level, with a heart rate fluctuating between 110 and 160 bpm.  She reports her normal resting heart rate is around 60 bpm.  She said her symptoms have significantly improved since her arrival.  However she continues to feel mildly lightheaded and faint.  She denies chest pain or pressure.  She does report that she had some possible symptoms of MS about a week or 2 ago, with some paresthesias or nerve pain on the side of her body.  She says she gets these flareups very infrequently.  She did not have any other ongoing symptoms, but is not currently taking steroids or any other medications for MS.  She sees Dr Einar Gip from cardiology, most recently about 2 years ago, and states she has not had problems with SVTs in many years.  In the past she was on metoprolol and Cardizem but the symptoms seem to resolve himself.  She denies use or drinking of excessive caffeine or any other drug use.  She denies smoking hx.  No hemoptysis or asymmetric LE edema. Patient denies personal or family history of DVT or PE. No recent hormone use (including OCP); travel for >6 hours; prolonged immobilization for greater than 3 days; surgeries or trauma in the last 4 weeks; or  malignancy with treatment within 6 months.   HPI     Past Medical History:  Diagnosis Date   Acute blood loss anemia 01/06/2016   Anemia    Diabetes mellitus without complication ([place]) 6387   Gestational diabetes only.   Dysrhythmia    Gestational diabetes    2nd preg only   Headache(784.0)    otc med prn   Heartburn in pregnancy    Maternal iron deficiency anemia 01/06/2016   MS (multiple sclerosis) ([place])    Postpartum care following cesarean delivery (5/11) 01/05/2016   Postpartum hypertension 03/07/2019   POTS (postural orthostatic tachycardia syndrome)    SVT (supraventricular tachycardia) ([place])    No problems since 10/2012 - no med needed   Vaginal Pap smear, abnormal    Vitamin deficiency     Patient Active Problem List   Diagnosis Date Noted   Chronic left hip pain 03/22/2021   Acute calculous cholecystitis 08/12/2019   Postpartum hypertension 03/07/2019   Maternal anemia, with delivery 02/27/2019   Rh negative, maternal 02/27/2019   Previous cesarean delivery affecting pregnancy 02/25/2019   Multiple sclerosis exacerbation ([place]) 10/22/2017   Acute hypokalemia 10/22/2017   POTS (postural orthostatic tachycardia syndrome) 10/22/2017   SVT (supraventricular tachycardia) ([place]) 10/22/2017   History of Gestational diabetes 10/22/2017   Maternal iron deficiency anemia 01/06/2016   Acute blood loss anemia 01/06/2016   Postpartum care following cesarean delivery (7/1) 01/05/2016   Wellness  examination 09/28/2014   Acute pharyngitis 09/08/2014   Previous cesarean section 12/19/2013   Multiple sclerosis ([place]) 09/07/2011   Migraine 09/07/2011   Paroxysmal SVT (supraventricular tachycardia) ([place]) 09/07/2011   UTI (urinary tract infection) 09/07/2011    Past Surgical History:  Procedure Laterality Date   ATRIAL ABLATION SURGERY  2003   FAILED   CESAREAN SECTION  2006   Breech   CESAREAN SECTION  2006   breech   CESAREAN SECTION N/A 12/19/2013   Procedure: REPEAT  CESAREAN SECTION;  Surgeon: Lovenia Kim, MD;  Location: [facility];  Service: Obstetrics;  Laterality: N/A;  EDD: 12/25/13   CESAREAN SECTION N/A 01/05/2016   Procedure: Repeat CESAREAN SECTION;  Surgeon: Brien Few, MD;  Location: [facility];  Service: Obstetrics;  Laterality: N/A;  EDD: 01/12/16    CESAREAN SECTION WITH BILATERAL TUBAL LIGATION Bilateral 02/25/2019   Procedure: Repeat CESAREAN SECTION WITH BILATERAL TUBAL LIGATION;  Surgeon: Brien Few, MD;  Location: [facility];  Service: Obstetrics;  Laterality: Bilateral;  Heather, RNFA  EDD: 03/04/19 Allergy: Adhesive, Paxil, Cipro   CHOLECYSTECTOMY N/A 08/13/2019   Procedure: LAPAROSCOPIC CHOLECYSTECTOMY WITH  CHOLANGIOGRAM;  Surgeon: Greer Pickerel, MD;  Location: [facility];  Service: General;  Laterality: N/A;   COLPOSCOPY     WISDOM TOOTH EXTRACTION       OB History     Gravida  6   Para  4   Term  4   Preterm      AB  2   Living  4      SAB  2   IAB      Ectopic      Multiple  0   Live Births  4           Family History  Problem Relation Age of Onset   Multiple sclerosis Mother    Migraines Mother    Emphysema Father    Heart disease Father    Migraines Father    Migraines Sister    Spina bifida Brother        half brother   Cancer Paternal Grandfather        LUNG   Diabetes Maternal Grandmother     Social History   Tobacco Use   Smoking status: Never   Smokeless tobacco: Never  Vaping Use   Vaping Use: Never used  Substance Use Topics   Alcohol use: No   Drug use: No    Home Medications Prior to Admission medications   Medication Sig Start Date End Date Taking? Authorizing Provider  metoprolol tartrate (LOPRESSOR) 25 MG tablet Take 1 tablet (25 mg total) by mouth 2 (two) times daily. 06/06/21 07/06/21 Yes Agustin Swatek, Carola Rhine, MD  Multiple Vitamins-Minerals (MULTIVITAMIN ADULTS) TABS Take 1 tablet by mouth daily.   Yes [provider]  potassium chloride (KLOR-CON) 10  MEQ tablet Take 1 tablet (10 mEq total) by mouth daily for 30 doses. 06/06/21 07/06/21 Yes Kareli Hossain, Carola Rhine, MD  Pseudoeph-Doxylamine-DM-APAP (NYQUIL PO) Take 2 capsules by mouth at bedtime as needed (cold symptoms).   Yes [provider]  Pseudoephedrine-APAP-DM (DAYQUIL PO) Take 2 capsules by mouth daily as needed (cold symptoms).   Yes [provider]  meloxicam (MOBIC) 15 MG tablet Take one tablet by mouth every morning with a meal for 2 weeks, then daily as needed for pain Patient not taking: Reported on 06/06/2021 03/22/21   Silverio Decamp, MD  ondansetron (ZOFRAN ODT) 4 MG disintegrating tablet Take  1 tablet (4 mg total) by mouth every 8 (eight) hours as needed for nausea or vomiting. Patient not taking: Reported on 06/06/2021 11/30/20   Saguier, Percell Miller, PA-C  ondansetron (ZOFRAN-ODT) 4 MG disintegrating tablet DISSOLVE 1 TABLET UNDER THE TONGUE 3 TIMES DAILY AS NEEDED FOR NAUSEA AND VOMITING Patient not taking: Reported on 06/06/2021 08/22/20 08/22/21      Allergies    Ciprofloxacin  Review of Systems   Review of Systems  Constitutional:  Negative for chills and fever.  Respiratory:  Negative for cough and shortness of breath.   Cardiovascular:  Positive for palpitations. Negative for chest pain.  Gastrointestinal:  Negative for abdominal pain and vomiting.  Musculoskeletal:  Negative for arthralgias and back pain.  Skin:  Negative for color change and rash.  Neurological:  Positive for light-headedness. Negative for syncope.  All other systems reviewed and are negative.  Physical Exam Updated Vital Signs BP 124/78 (BP Location: Right Arm)   Pulse 61   Temp 98.4 F (36.9 C) (Oral)   Resp 16   Ht 5\' 10"  (1.778 m)   Wt 67.6 kg   SpO2 97%   BMI 21.38 kg/m   Physical Exam Constitutional:      General: She is not in acute distress. HENT:     Head: Normocephalic and atraumatic.  Eyes:     Conjunctiva/sclera: Conjunctivae normal.     Pupils:  Pupils are equal, round, and reactive to light.  Cardiovascular:     Rate and Rhythm: Regular rhythm. Tachycardia present.  Pulmonary:     Effort: Pulmonary effort is normal. No respiratory distress.  Abdominal:     General: There is no distension.     Tenderness: There is no abdominal tenderness.  Musculoskeletal:        General: No swelling.     Right lower leg: No edema.     Left lower leg: No edema.  Skin:    General: Skin is warm and dry.  Neurological:     General: No focal deficit present.     Mental Status: She is alert. Mental status is at baseline.  Psychiatric:        Mood and Affect: Mood normal.        Behavior: Behavior normal.    ED Results / Procedures / Treatments   Labs (all labs ordered are listed, but only abnormal results are displayed) Labs Reviewed  BASIC METABOLIC PANEL - Abnormal; Notable for the following components:      Result Value   Potassium 3.4 (*)    CO2 20 (*)    Glucose, Bld 122 (*)    Calcium 8.3 (*)    All other components within normal limits  CBC  D-DIMER, QUANTITATIVE  MAGNESIUM  I-STAT BETA HCG BLOOD, ED ([facility], [facility], [facility] ONLY)  TROPONIN I (HIGH SENSITIVITY)    EKG EKG Interpretation  Date/Time:  Tuesday June 06 2021 08:56:41 EDT Ventricular Rate:  116 PR Interval:  144 QRS Duration: 77 QT Interval:  333 QTC Calculation: 463 R Axis:   81 Text Interpretation: Sinus tachycardia Borderline repol abnormality, diffuse leads Confirmed by Octaviano Glow [phone]) on 06/06/2021 9:18:34 AM  Radiology DG Chest 2 View  Result Date: 06/06/2021 CLINICAL DATA:  Palpitations, congestion EXAM: CHEST - 2 VIEW COMPARISON:  08/20/2011 FINDINGS: The heart size and mediastinal contours are within normal limits. Both lungs are clear. The visualized skeletal structures are unremarkable. IMPRESSION: Normal study. Electronically Signed   By: Rolm Baptise M.D.   On:  06/06/2021 09:41    Procedures Procedures   Medications Ordered in  ED Medications  metoprolol tartrate (LOPRESSOR) tablet 25 mg (25 mg Oral Given 06/06/21 1238)    ED Course  I have reviewed the triage vital signs and the nursing notes.  Pertinent labs & imaging results that were available during my care of the patient were reviewed by me and considered in my medical decision making (see chart for details).  Patient is here with tachycardia, possible episode of SVT at home which is resolved.  Her heart rate here is between 100-110bpm at rest.  I personally viewed and interpreted her EKG which shows a sinus tachycardia with no acute ischemic findings.  This may been an SVT in the setting of electrolyte derangements or recent viral illness or possible MS.  She does not have any other significant MS symptoms at this time including vision loss or neurological deficits.  I also ordered and reviewed and interpreted her chest x-ray shows no focal infiltrates or evidence of pneumothorax.  We discussed pulmonary embolism with is on the differential with her persistent tachycardia.  She agrees for a D-dimer test and possible PE scan if necessary.  Her partner (husband) Karen Powers is present at bedside and provides supplemental history.  Personally reviewed and interpreted her labs showing mild hypoK and hypoCa, mag normal. Ddimer negative.  Trop < 2.  She has a history of cholecystectomy, tubal ligation, several C-sections.  No other known cardiac history.  I doubt this is acute biliary disease.    Clinical Course as of 06/06/21 1810  Tue Jun 06, 2021  1222 Patient reassessed and remains minimally symptomatic.  Her heart rate is persistently in the high 90s.  Because this is higher than her regular resting heart rate, I think is reasonable to start her back metoprolol.  We can try 25 mg twice daily, and have her f/u with Dr Irven Shelling office.  She is in agreement with this plan.  We will give her first dose here. [MT]  1230 Also discussed prescribing oral potassium and  TUMS (she has these) at home for her hypoK and hypoCa, which are mild. [MT]  1025 With a negative D-dimer and undetectable troponin, I have a lower suspicion at this time for pulmonary embolism. [MT]    Clinical Course User Index [MT] Giannina Bartolome, Carola Rhine, MD    Final Clinical Impression(s) / ED Diagnoses Final diagnoses:  Hypokalemia  Palpitations  Tachycardia  Hypocalcemia    Rx / DC Orders ED Discharge Orders          Ordered    potassium chloride (KLOR-CON) 10 MEQ tablet  Daily        06/06/21 1232    metoprolol tartrate (LOPRESSOR) 25 MG tablet  2 times daily        06/06/21 1232             Wyvonnia Dusky, MD 06/06/21 1810

## 2021-06-15 ENCOUNTER — Other Ambulatory Visit (HOSPITAL_BASED_OUTPATIENT_CLINIC_OR_DEPARTMENT_OTHER): Payer: Self-pay

## 2021-07-16 DIAGNOSIS — R300 Dysuria: Secondary | ICD-10-CM | POA: Diagnosis not present

## 2021-08-03 ENCOUNTER — Other Ambulatory Visit: Payer: Self-pay

## 2021-08-03 ENCOUNTER — Encounter (HOSPITAL_COMMUNITY): Payer: Self-pay | Admitting: Emergency Medicine

## 2021-08-03 ENCOUNTER — Emergency Department (HOSPITAL_COMMUNITY)
Admission: EM | Admit: 2021-08-03 | Discharge: 2021-08-04 | Disposition: A | Discharge status: Home / Self Care | Payer: 59 | Attending: Emergency Medicine | Admitting: Emergency Medicine

## 2021-08-03 DIAGNOSIS — M40204 Unspecified kyphosis, thoracic region: Secondary | ICD-10-CM | POA: Diagnosis not present

## 2021-08-03 DIAGNOSIS — M6281 Muscle weakness (generalized): Secondary | ICD-10-CM | POA: Insufficient documentation

## 2021-08-03 DIAGNOSIS — M2578 Osteophyte, vertebrae: Secondary | ICD-10-CM | POA: Diagnosis not present

## 2021-08-03 DIAGNOSIS — R202 Paresthesia of skin: Secondary | ICD-10-CM | POA: Insufficient documentation

## 2021-08-03 DIAGNOSIS — G379 Demyelinating disease of central nervous system, unspecified: Secondary | ICD-10-CM | POA: Diagnosis not present

## 2021-08-03 DIAGNOSIS — N9489 Other specified conditions associated with female genital organs and menstrual cycle: Secondary | ICD-10-CM | POA: Diagnosis not present

## 2021-08-03 DIAGNOSIS — Z79899 Other long term (current) drug therapy: Secondary | ICD-10-CM | POA: Insufficient documentation

## 2021-08-03 DIAGNOSIS — I629 Nontraumatic intracranial hemorrhage, unspecified: Secondary | ICD-10-CM | POA: Diagnosis not present

## 2021-08-03 DIAGNOSIS — R519 Headache, unspecified: Secondary | ICD-10-CM | POA: Insufficient documentation

## 2021-08-03 DIAGNOSIS — R42 Dizziness and giddiness: Secondary | ICD-10-CM | POA: Diagnosis not present

## 2021-08-03 LAB — I-STAT BETA HCG BLOOD, ED (MC, WL, AP ONLY): I-stat hCG, quantitative: <5 m[IU]/mL (ref <5)

## 2021-08-03 LAB — CBC WITH DIFFERENTIAL/PLATELET
Abs Immature Granulocytes: 0.02 10*3/uL (ref 0.00–0.07)
Basophils Absolute: 0 10*3/uL (ref 0.0–0.1)
Basophils Relative: 0 %
Eosinophils Absolute: 0.1 10*3/uL (ref 0.0–0.5)
Eosinophils Relative: 1 %
HCT: 34.3 % — ABNORMAL LOW (ref 36.0–46.0)
Hemoglobin: 11.8 g/dL — ABNORMAL LOW (ref 12.0–15.0)
Immature Granulocytes: 0 %
Lymphocytes Relative: 33 %
Lymphs Abs: 2.4 10*3/uL (ref 0.7–4.0)
MCH: 31.5 pg (ref 26.0–34.0)
MCHC: 34.4 g/dL (ref 30.0–36.0)
MCV: 91.5 fL (ref 80.0–100.0)
Monocytes Absolute: 0.5 10*3/uL (ref 0.1–1.0)
Monocytes Relative: 6 %
Neutro Abs: 4.2 10*3/uL (ref 1.7–7.7)
Neutrophils Relative %: 60 %
Platelets: 212 10*3/uL (ref 150–400)
RBC: 3.75 MIL/uL — ABNORMAL LOW (ref 3.87–5.11)
RDW: 12 % (ref 11.5–15.5)
WBC: 7.3 10*3/uL (ref 4.0–10.5)
nRBC: 0 % (ref 0.0–0.2)

## 2021-08-03 LAB — I-STAT CHEM 8, ED
BUN: 6 mg/dL (ref 6–20)
Calcium, Ion: 1.19 mmol/L (ref 1.15–1.40)
Chloride: 103 mmol/L (ref 98–111)
Creatinine, Ser: 0.7 mg/dL (ref 0.44–1.00)
Glucose, Bld: 106 mg/dL — ABNORMAL HIGH (ref 70–99)
HCT: 34 % — ABNORMAL LOW (ref 36.0–46.0)
Hemoglobin: 11.6 g/dL — ABNORMAL LOW (ref 12.0–15.0)
Potassium: 3.2 mmol/L — ABNORMAL LOW (ref 3.5–5.1)
Sodium: 141 mmol/L (ref 135–145)
TCO2: 26 mmol/L (ref 22–32)

## 2021-08-03 LAB — BASIC METABOLIC PANEL
Anion gap: 8 (ref 5–15)
BUN: 7 mg/dL (ref 6–20)
CO2: 26 mmol/L (ref 22–32)
Calcium: 9.3 mg/dL (ref 8.9–10.3)
Chloride: 105 mmol/L (ref 98–111)
Creatinine, Ser: 0.75 mg/dL (ref 0.44–1.00)
GFR, Estimated: >60 mL/min (ref >60)
Glucose, Bld: 111 mg/dL — ABNORMAL HIGH (ref 70–99)
Potassium: 3.3 mmol/L — ABNORMAL LOW (ref 3.5–5.1)
Sodium: 139 mmol/L (ref 135–145)

## 2021-08-03 MED ORDER — ACETAMINOPHEN 500 MG PO TABS
1000.0000 mg | ORAL_TABLET | Freq: Once | ORAL | Status: AC
  Administered 2021-08-03: 1000 mg via ORAL
  Filled 2021-08-03: qty 2

## 2021-08-03 NOTE — ED Triage Notes (Signed)
Patient here with abnormal gait, dizziness and not feeling right.  She states that she has been having migraines(3) in the last week, which is abnormal for her.  She also has history of MS.  Right leg feels like it is 'asleep".  She has dizziness with turning of her head.

## 2021-08-03 NOTE — ED Provider Notes (Signed)
[facility] EMERGENCY DEPARTMENT Provider Note   CSN: [id number] Arrival date & time: 08/03/21  2150     History Chief Complaint  Patient presents with   Unstable Gait    KALIANNA VERBEKE is a 40 y.o. female.  The history is provided by the patient and medical records.  Extremity Weakness This is a new (Right leg numbness and paresthesias, feeling off balanced, new partial visual field loss) problem. The current episode started 6 to 12 hours ago. The problem occurs rarely. The problem has not changed since onset.Associated symptoms include headaches. Pertinent negatives include no chest pain, no abdominal pain and no shortness of breath. Nothing aggravates the symptoms. Nothing relieves the symptoms. She has tried nothing for the symptoms.      Past Medical History:  Diagnosis Date   Acute blood loss anemia 01/06/2016   Anemia    Diabetes mellitus without complication ([place]) 8466   Gestational diabetes only.   Dysrhythmia    Gestational diabetes    2nd preg only   Headache(784.0)    otc med prn   Heartburn in pregnancy    Maternal iron deficiency anemia 01/06/2016   MS (multiple sclerosis) ([place])    Postpartum care following cesarean delivery (5/11) 01/05/2016   Postpartum hypertension 03/07/2019   POTS (postural orthostatic tachycardia syndrome)    SVT (supraventricular tachycardia) ([place])    No problems since 10/2012 - no med needed   Vaginal Pap smear, abnormal    Vitamin deficiency     Patient Active Problem List   Diagnosis Date Noted   Chronic left hip pain 03/22/2021   Acute calculous cholecystitis 08/12/2019   Postpartum hypertension 03/07/2019   Maternal anemia, with delivery 02/27/2019   Rh negative, maternal 02/27/2019   Previous cesarean delivery affecting pregnancy 02/25/2019   Multiple sclerosis exacerbation ([place]) 10/22/2017   Acute hypokalemia 10/22/2017   POTS (postural orthostatic tachycardia syndrome) 10/22/2017   SVT  (supraventricular tachycardia) ([place]) 10/22/2017   History of Gestational diabetes 10/22/2017   Maternal iron deficiency anemia 01/06/2016   Acute blood loss anemia 01/06/2016   Postpartum care following cesarean delivery (7/1) 01/05/2016   Wellness examination 09/28/2014   Acute pharyngitis 09/08/2014   Previous cesarean section 12/19/2013   Multiple sclerosis ([place]) 09/07/2011   Migraine 09/07/2011   Paroxysmal SVT (supraventricular tachycardia) ([place]) 09/07/2011   UTI (urinary tract infection) 09/07/2011    Past Surgical History:  Procedure Laterality Date   ATRIAL ABLATION SURGERY  2003   FAILED   CESAREAN SECTION  2006   Breech   CESAREAN SECTION  2006   breech   CESAREAN SECTION N/A 12/19/2013   Procedure: REPEAT CESAREAN SECTION;  Surgeon: Lovenia Kim, MD;  Location: [facility];  Service: Obstetrics;  Laterality: N/A;  EDD: 12/25/13   CESAREAN SECTION N/A 01/05/2016   Procedure: Repeat CESAREAN SECTION;  Surgeon: Brien Few, MD;  Location: [facility];  Service: Obstetrics;  Laterality: N/A;  EDD: 01/12/16    CESAREAN SECTION WITH BILATERAL TUBAL LIGATION Bilateral 02/25/2019   Procedure: Repeat CESAREAN SECTION WITH BILATERAL TUBAL LIGATION;  Surgeon: Brien Few, MD;  Location: [facility];  Service: Obstetrics;  Laterality: Bilateral;  Heather, RNFA  EDD: 03/04/19 Allergy: Adhesive, Paxil, Cipro   CHOLECYSTECTOMY N/A 08/13/2019   Procedure: LAPAROSCOPIC CHOLECYSTECTOMY WITH  CHOLANGIOGRAM;  Surgeon: Greer Pickerel, MD;  [facility]: [facility];  Service: General;  Laterality: N/A;   COLPOSCOPY     WISDOM TOOTH EXTRACTION       OB  History     Gravida  6   Para  4   Term  4   Preterm      AB  2   Living  4      SAB  2   IAB      Ectopic      Multiple  0   Live Births  4           Family History  Problem Relation Age of Onset   Multiple sclerosis Mother    Migraines Mother    Emphysema Father    Heart disease Father    Migraines Father     Migraines Sister    Spina bifida Brother        half brother   Cancer Paternal Grandfather        LUNG   Diabetes Maternal Grandmother     Social History   Tobacco Use   Smoking status: Never   Smokeless tobacco: Never  Vaping Use   Vaping Use: Never used  Substance Use Topics   Alcohol use: No   Drug use: No    Home Medications Prior to Admission medications   Medication Sig Start Date End Date Taking? Authorizing Provider  meloxicam (MOBIC) 15 MG tablet Take one tablet by mouth every morning with a meal for 2 weeks, then daily as needed for pain Patient not taking: Reported on 06/06/2021 03/22/21   Silverio Decamp, MD  metoprolol tartrate (LOPRESSOR) 25 MG tablet Take 1 tablet (25 mg total) by mouth 2 (two) times daily. 06/06/21 07/06/21  Wyvonnia Dusky, MD  Multiple Vitamins-Minerals (MULTIVITAMIN ADULTS) TABS Take 1 tablet by mouth daily.    [provider]  ondansetron (ZOFRAN ODT) 4 MG disintegrating tablet Take 1 tablet (4 mg total) by mouth every 8 (eight) hours as needed for nausea or vomiting. Patient not taking: Reported on 06/06/2021 11/30/20   Saguier, Percell Miller, PA-C  ondansetron (ZOFRAN-ODT) 4 MG disintegrating tablet DISSOLVE 1 TABLET UNDER THE TONGUE 3 TIMES DAILY AS NEEDED FOR NAUSEA AND VOMITING Patient not taking: Reported on 06/06/2021 08/22/20 08/22/21    potassium chloride (KLOR-CON) 10 MEQ tablet Take 1 tablet (10 mEq total) by mouth daily for 30 doses. 06/06/21 07/06/21  Wyvonnia Dusky, MD  Pseudoeph-Doxylamine-DM-APAP (NYQUIL PO) Take 2 capsules by mouth at bedtime as needed (cold symptoms).    [provider]  Pseudoephedrine-APAP-DM (DAYQUIL PO) Take 2 capsules by mouth daily as needed (cold symptoms).    [provider]    Allergies    Ciprofloxacin  Review of Systems   Review of Systems  Constitutional:  Negative for chills and fever.  HENT:  Negative for ear pain and sore throat.   Eyes:  Negative for pain and  visual disturbance.  Respiratory:  Negative for cough and shortness of breath.   Cardiovascular:  Negative for chest pain and palpitations.  Gastrointestinal:  Negative for abdominal pain and vomiting.  Genitourinary:  Negative for dysuria and hematuria.  Musculoskeletal:  Positive for extremity weakness. Negative for arthralgias and back pain.  Skin:  Negative for color change and rash.  Neurological:  Positive for headaches. Negative for seizures and syncope.  All other systems reviewed and are negative.  Physical Exam Updated Vital Signs BP (!) 151/100   Pulse (!) 109   Temp 98.5 F (36.9 C) (Oral)   Resp 16   LMP 07/29/2021 (Exact Date)   SpO2 99%   Physical Exam Vitals and nursing note reviewed.  Constitutional:      General: She is not in acute distress.    Appearance: Normal appearance. She is well-developed.  HENT:     Head: Normocephalic and atraumatic.     Right Ear: External ear normal.     Left Ear: External ear normal.     Nose: Nose normal. No congestion or rhinorrhea.     Mouth/Throat:     Mouth: Mucous membranes are moist.  Eyes:     Extraocular Movements: Extraocular movements intact.     Conjunctiva/sclera: Conjunctivae normal.     Pupils: Pupils are equal, round, and reactive to light.  Cardiovascular:     Rate and Rhythm: Normal rate and regular rhythm.     Pulses: Normal pulses.     Heart sounds: No murmur heard. Pulmonary:     Effort: Pulmonary effort is normal. No respiratory distress.     Breath sounds: Normal breath sounds. No wheezing, rhonchi or rales.  Abdominal:     General: Abdomen is flat. Bowel sounds are normal.     Palpations: Abdomen is soft.     Tenderness: There is no abdominal tenderness. There is no guarding or rebound.  Musculoskeletal:        General: No swelling, tenderness or deformity.     Cervical back: Normal range of motion and neck supple. No rigidity.  Skin:    General: Skin is warm and dry.     Capillary Refill:  Capillary refill takes less than 2 seconds.  Neurological:     Mental Status: She is alert and oriented to person, place, and time.     Cranial Nerves: No cranial nerve deficit.     Sensory: Sensory deficit (Subjective paresthesias right lower extremity) present.     Motor: Weakness (4-5 strength right lower extremity compared to left) present.  Psychiatric:        Mood and Affect: Mood normal.    ED Results / Procedures / Treatments   Labs (all labs ordered are listed, but only abnormal results are displayed) Labs Reviewed  CBC WITH DIFFERENTIAL/PLATELET - Abnormal; Notable for the following components:      Result Value   RBC 3.75 (*)    Hemoglobin 11.8 (*)    HCT 34.3 (*)    All other components within normal limits  BASIC METABOLIC PANEL - Abnormal; Notable for the following components:   Potassium 3.3 (*)    Glucose, Bld 111 (*)    All other components within normal limits  I-STAT CHEM 8, ED - Abnormal; Notable for the following components:   Potassium 3.2 (*)    Glucose, Bld 106 (*)    Hemoglobin 11.6 (*)    HCT 34.0 (*)    All other components within normal limits  I-STAT BETA HCG BLOOD, ED ([facility], [facility], [facility] ONLY)    EKG None  Radiology No results found.  Procedures Procedures   Medications Ordered in ED Medications  acetaminophen (TYLENOL) tablet 1,000 mg (has no administration in time range)    ED Course  I have reviewed the triage vital signs and the nursing notes.  Pertinent labs & imaging results that were available during my care of the patient were reviewed by me and considered in my medical decision making (see chart for details).    MDM Rules/Calculators/A&P                          This is a 40 year old female with a history of  MS.  Last relapse was in 2018.  She is not currently on immunologic's.  She is presenting with right leg weakness and paresthesias as above associated with some vision changes and headaches.  Described as similar to prior MS  flare.  She is afebrile and hemodynamic stable. ABCs intact. She has no meningeal signs on exam.  Low concern for meningitis.  Neuro exam as above.  Plan for basic labs and MRI to further evaluate for progressive demyelinating disease.    Final Clinical Impression(s) / ED Diagnoses Final diagnoses:  Right leg paresthesias  Acute nonintractable headache, unspecified headache type    Rx / DC Orders ED Discharge Orders     None        Idamae Lusher, MD 08/03/21 2317    Carmin Muskrat, MD 08/04/21 [phone]

## 2021-08-03 NOTE — ED Notes (Signed)
Patient ambulated to toilet independently .

## 2021-08-04 ENCOUNTER — Emergency Department (HOSPITAL_COMMUNITY): Payer: 59

## 2021-08-04 DIAGNOSIS — R202 Paresthesia of skin: Secondary | ICD-10-CM | POA: Diagnosis not present

## 2021-08-04 DIAGNOSIS — R519 Headache, unspecified: Secondary | ICD-10-CM | POA: Diagnosis not present

## 2021-08-04 DIAGNOSIS — M2578 Osteophyte, vertebrae: Secondary | ICD-10-CM | POA: Diagnosis not present

## 2021-08-04 DIAGNOSIS — M40204 Unspecified kyphosis, thoracic region: Secondary | ICD-10-CM | POA: Diagnosis not present

## 2021-08-04 DIAGNOSIS — M6281 Muscle weakness (generalized): Secondary | ICD-10-CM | POA: Diagnosis not present

## 2021-08-04 DIAGNOSIS — I629 Nontraumatic intracranial hemorrhage, unspecified: Secondary | ICD-10-CM | POA: Diagnosis not present

## 2021-08-04 DIAGNOSIS — N9489 Other specified conditions associated with female genital organs and menstrual cycle: Secondary | ICD-10-CM | POA: Diagnosis not present

## 2021-08-04 DIAGNOSIS — Z79899 Other long term (current) drug therapy: Secondary | ICD-10-CM | POA: Diagnosis not present

## 2021-08-04 DIAGNOSIS — R42 Dizziness and giddiness: Secondary | ICD-10-CM | POA: Diagnosis not present

## 2021-08-04 DIAGNOSIS — G379 Demyelinating disease of central nervous system, unspecified: Secondary | ICD-10-CM | POA: Diagnosis not present

## 2021-08-04 LAB — URINALYSIS, ROUTINE W REFLEX MICROSCOPIC
Bilirubin Urine: NEGATIVE
Glucose, UA: NEGATIVE mg/dL
Hgb urine dipstick: NEGATIVE
Ketones, ur: NEGATIVE mg/dL
Leukocytes,Ua: NEGATIVE
Nitrite: NEGATIVE
Protein, ur: NEGATIVE mg/dL
Specific Gravity, Urine: 1.025 (ref 1.005–1.030)
pH: 6 (ref 5.0–8.0)

## 2021-08-04 MED ORDER — GADOBUTROL 1 MMOL/ML IV SOLN
7.5000 mL | Freq: Once | INTRAVENOUS | Status: AC | PRN
  Administered 2021-08-04: 7.5 mL via INTRAVENOUS

## 2021-08-04 MED ORDER — PREDNISONE 50 MG PO TABS: 650.0000 mg | ORAL_TABLET | Freq: Every day | ORAL | 0 refills | Status: AC

## 2021-08-04 NOTE — ED Provider Notes (Signed)
I assumed care of this patient.  Please see previous provider note for further details of Hx, PE.  Briefly patient is a 40 y.o. female who presented with feeling unbalanced in addition to having right leg numbness and heaviness.  History of MS. currently awaiting MRI brain, and entire spine.  MRI notable for previous lesions without new MS flare. On my examination patient does have faint right lower extremity weakness requiring more effort.  Case discussed with Dr. Leonel Ramsay from neurology.  He recommended obtaining a UA to rule out UTI.  If negative, we can discuss providing high-dose steroids here and a dose for 2 days at home versus monitoring for improvement/progression at home with a prescription for prednisone in case symptoms worsen or do not resolve in the next several days.  UA was negative for UTI. With shared decision making, patient opted to get a deferred prescription for prednisone.  The patient appears reasonably screened and/or stabilized for discharge and I doubt any other medical condition or other [facility] requiring further screening, evaluation, or treatment in the ED at this time prior to discharge. Safe for discharge with strict return precautions.  Disposition: Discharge  Condition: Good  I have discussed the results, Dx and Tx plan with the patient/family who expressed understanding and agree(s) with the plan. Discharge instructions discussed at length. The patient/family was given strict return precautions who verbalized understanding of the instructions. No further questions at time of discharge.    ED Discharge Orders          Ordered    predniSONE (DELTASONE) 50 MG tablet  Daily        08/04/21 0631              Follow Up: Neurology  Go to  as scheduled        Cordero Surette, Grayce Sessions, MD 08/04/21 [phone]

## 2021-08-04 NOTE — ED Notes (Signed)
Patient transported to MRI 

## 2021-08-04 NOTE — ED Notes (Signed)
Returned from MRI 

## 2021-09-19 ENCOUNTER — Ambulatory Visit (INDEPENDENT_AMBULATORY_CARE_PROVIDER_SITE_OTHER): Payer: 59 | Admitting: Medical

## 2021-09-19 ENCOUNTER — Other Ambulatory Visit (HOSPITAL_BASED_OUTPATIENT_CLINIC_OR_DEPARTMENT_OTHER): Payer: Self-pay

## 2021-09-19 VITALS — BP 126/72 | HR 84 | Temp 98.2°F | Resp 18 | Ht 70.0 in | Wt 157.0 lb

## 2021-09-19 DIAGNOSIS — Z0000 Encounter for general adult medical examination without abnormal findings: Secondary | ICD-10-CM

## 2021-09-19 DIAGNOSIS — R5383 Other fatigue: Secondary | ICD-10-CM

## 2021-09-19 DIAGNOSIS — E559 Vitamin D deficiency, unspecified: Secondary | ICD-10-CM | POA: Diagnosis not present

## 2021-09-19 DIAGNOSIS — F419 Anxiety disorder, unspecified: Secondary | ICD-10-CM | POA: Diagnosis not present

## 2021-09-19 LAB — CBC WITH DIFFERENTIAL/PLATELET
Basophils Absolute: 0 10*3/uL (ref 0.0–0.1)
Basophils Relative: 0.5 % (ref 0.0–3.0)
Eosinophils Absolute: 0.1 10*3/uL (ref 0.0–0.7)
Eosinophils Relative: 2.1 % (ref 0.0–5.0)
HCT: 34.9 % — ABNORMAL LOW (ref 36.0–46.0)
Hemoglobin: 11.9 g/dL — ABNORMAL LOW (ref 12.0–15.0)
Lymphocytes Relative: 29 % (ref 12.0–46.0)
Lymphs Abs: 1.4 10*3/uL (ref 0.7–4.0)
MCHC: 34 g/dL (ref 30.0–36.0)
MCV: 90.7 fl (ref 78.0–100.0)
Monocytes Absolute: 0.3 10*3/uL (ref 0.1–1.0)
Monocytes Relative: 6.1 % (ref 3.0–12.0)
Neutro Abs: 3.1 10*3/uL (ref 1.4–7.7)
Neutrophils Relative %: 62.3 % (ref 43.0–77.0)
Platelets: 212 10*3/uL (ref 150.0–400.0)
RBC: 3.85 Mil/uL — ABNORMAL LOW (ref 3.87–5.11)
RDW: 12.9 % (ref 11.5–15.5)
WBC: 4.9 10*3/uL (ref 4.0–10.5)

## 2021-09-19 LAB — T4, FREE: Free T4: 0.8 ng/dL (ref 0.60–1.60)

## 2021-09-19 LAB — COMPREHENSIVE METABOLIC PANEL
ALT: 12 U/L (ref 0–35)
AST: 14 U/L (ref 0–37)
Albumin: 4.3 g/dL (ref 3.5–5.2)
Alkaline Phosphatase: 56 U/L (ref 39–117)
BUN: 6 mg/dL (ref 6–23)
CO2: 28 mEq/L (ref 19–32)
Calcium: 8.7 mg/dL (ref 8.4–10.5)
Chloride: 106 mEq/L (ref 96–112)
Creatinine, Ser: 0.65 mg/dL (ref 0.40–1.20)
GFR: 110.22 mL/min (ref >60.00)
Glucose, Bld: 92 mg/dL (ref 70–99)
Potassium: 4.2 mEq/L (ref 3.5–5.1)
Sodium: 139 mEq/L (ref 135–145)
Total Bilirubin: 0.8 mg/dL (ref 0.2–1.2)
Total Protein: 6.4 g/dL (ref 6.0–8.3)

## 2021-09-19 LAB — LIPID PANEL
Cholesterol: 158 mg/dL (ref 0–200)
HDL: 52.9 mg/dL (ref >39.00)
LDL Cholesterol: 97 mg/dL (ref 0–99)
NonHDL: 105.18
Total CHOL/HDL Ratio: 3
Triglycerides: 41 mg/dL (ref 0.0–149.0)
VLDL: 8.2 mg/dL (ref 0.0–40.0)

## 2021-09-19 LAB — VITAMIN B12: Vitamin B-12: 183 pg/mL — ABNORMAL LOW (ref 211–911)

## 2021-09-19 LAB — TSH: TSH: 0.97 u[IU]/mL (ref 0.35–5.50)

## 2021-09-19 MED ORDER — LORAZEPAM 0.5 MG PO TABS
0.5000 mg | ORAL_TABLET | Freq: Two times a day (BID) | ORAL | 0 refills | Status: DC | PRN
  Filled 2021-09-19: qty 8, 4d supply, fill #0

## 2021-09-19 NOTE — Progress Notes (Signed)
Subjective:    Patient ID: Karen Powers, female    DOB: [birth year], 41 y.o.   MRN: [id number]  HPI  Pt in for wellness.   Pt is fasting. Works as Marine scientist. Pt not exercising. Pt eating better lost 23 lbs since birth of son and has gained some back. Admits to not eating very healthy. About 4 sodas in a week.  Pt has appointment to do papsmear with gyn in about 2 weeks and will get mammogram.  Reviewed pt recent ED visit. She tells me her symptoms resolved. Mri was negative. They did talk to neurologist and states psuedo flare. She did not take prednisone and symptoms resolved quickly.  Also update me in October she had one day of heart rate that would go up and down. Hx of svt. She went to ED that day. No reoccurence since then. She was give b blocker that day. She decided not to take. Pt in past did see Dr. Einar Gip in past and had work  up.   Recent anxiety- she feels like at times she is getting into panic attack. She tries to control herself but feels like "about to lose it". She has 4 children. Pt is states in past used 0.5 mg of ativan in past. She used this recently and helped quickly. States some degree of generalized daily anxiety.   Gad 7 score of 6.  Review of Systems  Constitutional:  Negative for chills, fatigue and fever.  Respiratory:  Negative for cough, chest tightness, shortness of breath and wheezing.   Cardiovascular:  Negative for chest pain and palpitations.  Gastrointestinal:  Negative for abdominal pain, blood in stool, diarrhea, nausea and vomiting.  Musculoskeletal:  Negative for back pain, joint swelling, myalgias and neck stiffness.  Skin:  Negative for rash.  Hematological:  Negative for adenopathy. Does not bruise/bleed easily.  Psychiatric/Behavioral:  Negative for behavioral problems and confusion. The patient is nervous/anxious.    Past Medical History:  Diagnosis Date   Acute blood loss anemia 01/06/2016   Anemia    Diabetes mellitus without  complication ([place]) 1583   Gestational diabetes only.   Dysrhythmia    Gestational diabetes    2nd preg only   Headache(784.0)    otc med prn   Heartburn in pregnancy    Maternal iron deficiency anemia 01/06/2016   MS (multiple sclerosis) ([facility])    Postpartum care following cesarean delivery (5/11) 01/05/2016   Postpartum hypertension 03/07/2019   POTS (postural orthostatic tachycardia syndrome)    SVT (supraventricular tachycardia) ([place])    No problems since 10/2012 - no med needed   Vaginal Pap smear, abnormal    Vitamin deficiency      Social History   Socioeconomic History   Marital status: Married    Spouse name: Vincent Ehrler   Number of children: 4   Years of education: Not on file   Highest education level: Not on file  Occupational History   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  Tobacco Use   Smoking status: Never   Smokeless tobacco: Never  Vaping Use   Vaping Use: Never used  Substance and Sexual Activity   Alcohol use: No   Drug use: No   Sexual activity: Yes    Birth control/protection: Pill    Comment: pregnant  Other Topics Concern   Not on file  Social History Narrative   Soda daily   Right handed    Lives with husband and 4 kids   She is  ER nurse       Social Determinants of Health   Financial Resource Strain: Not on file  Food Insecurity: Not on file  Transportation Needs: Not on file  Physical Activity: Not on file  Stress: Not on file  Social Connections: Not on file  Intimate Partner Violence: Not on file    Past Surgical History:  Procedure Laterality Date   ATRIAL ABLATION SURGERY  2003   FAILED   CESAREAN SECTION  2006   Breech   CESAREAN SECTION  2006   breech   CESAREAN SECTION N/A 12/19/2013   Procedure: REPEAT CESAREAN SECTION;  Surgeon: Lovenia Kim, MD;  Location: [facility];  Service: Obstetrics;  Laterality: N/A;  EDD: 12/25/13   CESAREAN SECTION N/A 01/05/2016   Procedure: Repeat CESAREAN SECTION;  Surgeon: Brien Few, MD;   Location: [place];  Service: Obstetrics;  Laterality: N/A;  EDD: 01/12/16    CESAREAN SECTION WITH BILATERAL TUBAL LIGATION Bilateral 02/25/2019   Procedure: Repeat CESAREAN SECTION WITH BILATERAL TUBAL LIGATION;  Surgeon: Brien Few, MD;  Location: [facility];  Service: Obstetrics;  Laterality: Bilateral;  Heather, RNFA  EDD: 03/04/19 Allergy: Adhesive, Paxil, Cipro   CHOLECYSTECTOMY N/A 08/13/2019   Procedure: LAPAROSCOPIC CHOLECYSTECTOMY WITH  CHOLANGIOGRAM;  Surgeon: Greer Pickerel, MD;  Location: [facility];  Service: General;  Laterality: N/A;   COLPOSCOPY     WISDOM TOOTH EXTRACTION      Family History  Problem Relation Age of Onset   Multiple sclerosis Mother    Migraines Mother    Emphysema Father    Heart disease Father    Migraines Father    Migraines Sister    Spina bifida Brother        half brother   Cancer Paternal Grandfather        LUNG   Diabetes Maternal Grandmother     Allergies  Allergen Reactions   Ciprofloxacin Rash and Other (See Comments)    Has patient had a PCN reaction causing immediate rash, facial/tongue/throat swelling, SOB or lightheadedness with hypotension: No Has patient had a PCN reaction causing severe rash involving mucus membranes or skin necrosis: No Has patient had a PCN reaction that required hospitalization No Has patient had a PCN reaction occurring within the last 10 years: Yes If all of the above answers are "NO", then may proceed with Cephalosporin use.    Current Outpatient Medications on File Prior to Visit  Medication Sig Dispense Refill   acetaminophen (TYLENOL) 500 MG tablet Take 1,000 mg by mouth every 6 (six) hours as needed for moderate pain or headache.     ibuprofen (ADVIL) 200 MG tablet Take 800 mg by mouth every 6 (six) hours as needed for headache or mild pain.     No current facility-administered medications on file prior to visit.    BP 126/72    Pulse 84    Temp 98.2 F (36.8 C)    Resp 18    Ht 5\' 10"   (1.778 m)    Wt 157 lb (71.2 kg)    SpO2 99%    BMI 22.53 kg/m        Objective:   Physical Exam  General Mental Status- Alert. General Appearance- Not in acute distress.   Skin General: Color- Normal Color. Moisture- Normal Moisture.  Neck Carotid Arteries- Normal color. Moisture- Normal Moisture. No carotid bruits. No JVD.  Chest and Lung Exam Auscultation: Breath Sounds:-Normal.  Cardiovascular Auscultation:Rythm- Regular. Murmurs & Other Heart Sounds:Auscultation  of the heart reveals- No Murmurs.  Abdomen Inspection:-Inspeection Normal. Palpation/Percussion:Note:No mass. Palpation and Percussion of the abdomen reveal- Non Tender, Non Distended + BS, no rebound or guarding.   Neurologic Cranial Nerve exam:- CN III-XII intact(No nystagmus), symmetric smile. Drift Test:- No drift. Finger to Nose:- Normal/Intact Strength:- 5/5 equal and symmetric strength both upper and lower extremities.       Assessment & Plan:   Patient Instructions  For you wellness exam today I have ordered cbc, cmp and  lipid panel.  Vaccine appear up to date.  Recommend exercise and healthy diet.  We will let you know lab results as they come in.  Follow up date appointment will be determined after lab review.     Let me know if you get recurrent tachycardia. In that event  would refer back to your cardiologist. For ms continue to follow up with neurologist.          Mackie Pai, PA-C    [phone] charge in addition to wellness as did address anxiety, rx ativan and explained potential contract and uds in future. Also reviewed tachycarida and possible ms flare in recent past.

## 2021-09-19 NOTE — Patient Instructions (Addendum)
For you wellness exam today I have ordered cbc, cmp and  lipid panel.  Vaccine appear up to date.  Recommend exercise and healthy diet.  We will let you know lab results as they come in.  Follow up date appointment will be determined after lab review.     Let me know if you get recurrent tachycardia. In that event  would refer back to your cardiologist. For ms continue to follow up with neurologist.  For anxiety will make limited number of ativan 0.5 mg to use sparingly for panic attack.. You declined daily buspar. So will only give ativan.  For fatigue included tsh, t4, b12  and vit D(low in past).     Preventive Care 81-65 Years Old, Female Preventive care refers to lifestyle choices and visits with your health care provider that can promote health and wellness. Preventive care visits are also called wellness exams. What can I expect for my preventive care visit? Counseling Your health care provider may ask you questions about your: Medical history, including: Past medical problems. Family medical history. Pregnancy history. Current health, including: Menstrual cycle. Method of birth control. Emotional well-being. Home life and relationship well-being. Sexual activity and sexual health. Lifestyle, including: Alcohol, nicotine or tobacco, and drug use. Access to firearms. Diet, exercise, and sleep habits. Work and work Statistician. Sunscreen use. Safety issues such as seatbelt and bike helmet use. Physical exam Your health care provider will check your: Height and weight. These may be used to calculate your BMI (body mass index). BMI is a measurement that tells if you are at a healthy weight. Waist circumference. This measures the distance around your waistline. This measurement also tells if you are at a healthy weight and may help predict your risk of certain diseases, such as type 2 diabetes and high blood pressure. Heart rate and blood pressure. Body  temperature. Skin for abnormal spots. What immunizations do I need? Vaccines are usually given at various ages, according to a schedule. Your health care provider will recommend vaccines for you based on your age, medical history, and lifestyle or other factors, such as travel or where you work. What tests do I need? Screening Your health care provider may recommend screening tests for certain conditions. This may include: Lipid and cholesterol levels. Diabetes screening. This is done by checking your blood sugar (glucose) after you have not eaten for a while (fasting). Pelvic exam and Pap test. Hepatitis B test. Hepatitis C test. HIV (human immunodeficiency virus) test. STI (sexually transmitted infection) testing, if you are at risk. Lung cancer screening. Colorectal cancer screening. Mammogram. Talk with your health care provider about when you should start having regular mammograms. This may depend on whether you have a family history of breast cancer. BRCA-related cancer screening. This may be done if you have a family history of breast, ovarian, tubal, or peritoneal cancers. Bone density scan. This is done to screen for osteoporosis. Talk with your health care provider about your test results, treatment options, and if necessary, the need for more tests. Follow these instructions at home: Eating and drinking  Eat a diet that includes fresh fruits and vegetables, whole grains, lean protein, and low-fat dairy products. Take vitamin and mineral supplements as recommended by your health care provider. Do not drink alcohol if: Your health care provider tells you not to drink. You are pregnant, may be pregnant, or are planning to become pregnant. If you drink alcohol: Limit how much you have to 0-1 drink a day.  Know how much alcohol is in your drink. In the [place] one drink equals one 12 oz bottle of beer (355 mL), one 5 oz glass of wine (148 mL), or one 1 oz glass of hard liquor (44  mL). Lifestyle Brush your teeth every morning and night with fluoride toothpaste. Floss one time each day. Exercise for at least 30 minutes 5 or more days each week. Do not use any products that contain nicotine or tobacco. These products include cigarettes, chewing tobacco, and vaping devices, such as e-cigarettes. If you need help quitting, ask your health care provider. Do not use drugs. If you are sexually active, practice safe sex. Use a condom or other form of protection to prevent STIs. If you do not wish to become pregnant, use a form of birth control. If you plan to become pregnant, see your health care provider for a prepregnancy visit. Take aspirin only as told by your health care provider. Make sure that you understand how much to take and what form to take. Work with your health care provider to find out whether it is safe and beneficial for you to take aspirin daily. Find healthy ways to manage stress, such as: Meditation, yoga, or listening to music. Journaling. Talking to a trusted person. Spending time with friends and family. Minimize exposure to UV radiation to reduce your risk of skin cancer. Safety Always wear your seat belt while driving or riding in a vehicle. Do not drive: If you have been drinking alcohol. Do not ride with someone who has been drinking. When you are tired or distracted. While texting. If you have been using any mind-altering substances or drugs. Wear a helmet and other protective equipment during sports activities. If you have firearms in your house, make sure you follow all gun safety procedures. Seek help if you have been physically or sexually abused. What's next? Visit your health care provider once a year for an annual wellness visit. Ask your health care provider how often you should have your eyes and teeth checked. Stay up to date on all vaccines. This information is not intended to replace advice given to you by your health care  provider. Make sure you discuss any questions you have with your health care provider. Document Revised: 02/08/2021 Document Reviewed: 02/08/2021 Elsevier Patient Education  [place].

## 2021-09-21 ENCOUNTER — Other Ambulatory Visit: Payer: Self-pay

## 2021-09-21 ENCOUNTER — Other Ambulatory Visit (HOSPITAL_BASED_OUTPATIENT_CLINIC_OR_DEPARTMENT_OTHER): Payer: Self-pay

## 2021-09-21 ENCOUNTER — Encounter: Payer: Self-pay | Admitting: Neurology

## 2021-09-21 ENCOUNTER — Ambulatory Visit: Payer: 59 | Admitting: Neurology

## 2021-09-21 VITALS — BP 116/76 | HR 82 | Ht 70.0 in | Wt 157.0 lb

## 2021-09-21 DIAGNOSIS — I471 Supraventricular tachycardia: Secondary | ICD-10-CM | POA: Diagnosis not present

## 2021-09-21 DIAGNOSIS — G35 Multiple sclerosis: Secondary | ICD-10-CM | POA: Diagnosis not present

## 2021-09-21 MED ORDER — CYANOCOBALAMIN 1000 MCG/ML IJ SOLN
INTRAMUSCULAR | 0 refills | Status: DC
  Filled 2021-09-21: qty 5, fill #0
  Filled 2021-11-14: qty 3, 84d supply, fill #0

## 2021-09-21 MED ORDER — "LUER LOCK SAFETY SYRINGES 25G X 5/8"" 3 ML MISC"
0 refills | Status: DC
  Filled 2021-09-21: qty 5, fill #0

## 2021-09-21 MED ORDER — CYANOCOBALAMIN 1000 MCG/ML IJ SOLN
INTRAMUSCULAR | 0 refills | Status: DC
  Filled 2021-09-21: qty 4, 28d supply, fill #0

## 2021-09-21 MED ORDER — "BD LUER-LOK SYRINGE 25G X 5/8"" 3 ML MISC"
0 refills | Status: DC
  Filled 2021-09-21: qty 4, 28d supply, fill #0

## 2021-09-21 NOTE — Progress Notes (Signed)
OUTPATIENT NEUROLOGY CLINIC    Provider:  Larey Seat, MD  Primary Care Physician:  Elise Benne [address] [address]     Referring Provider:  Mackie Pai, [address],  [place] [address]          Chief Complaint according to patient   Patient presents with:     New Patient (Initial Visit)           HISTORY OF PRESENT ILLNESS:  Karen Powers is a 41 y.o. year old  Caucasian female patient seen here face to face upon referral  by ED _ visit there on 08-03-2021: PCP remains Dr Harvie Heck   Chief concern according to patient : MS- resuming  of care needed.   09-21-2021:    I have the pleasure of seeing Karen Clan, RN  today, a right -handed married Caucasian female who recently gave birth on July 1st 2020 to a baby boy. Her fourth baby. And she was taken a drug holiday during/ after pregnancy and  lactation-  She has recently suffered the first relapse while off medication since last in 2018 . The patient was seen in the emergency room at [facility] on 8 December at about 9 PM.  Her main concern was unstable gait.  She stated that she felt off balance her right leg had been numb and paresthetic and she had also a partial visual field loss.  She has noticed some difficulties with reading on the computer screen.  She had also noted 3 or 4 migraine headaches which often came with visual auras and symptoms in a span of 14 days prior to her presentation in the ED.  She did not have pain but dysesthesia.  She was diagnosed in 2019 with sports based on the presence of supraventricular tachycardia.  I looked through her medication and as I explained below she is currently not on any immune modification medication.   She took Mobic, she was recently prescribed a beta-blocker but has yet to take it as she felt it was only a situational presence of SVT for which is was prescribed.  She does have  a as needed order for Zofran which she also is currently not taking there are really no prescription drugs that she is taking on a daily basis.  She does take multivitamins.    Blood pressure here is low 116/70, the patient has a normal BMI of 22.5 in winter clothing.     HISTORY : She was diagnosed in 2009- was seen at [facility] by Dr Gara Kroner, started on Copaxone and  she has never been on another medication. Her mother is followed for MS there.  She  has a past medical history of Acute blood loss anemia (01/06/2016), Anemia, Diabetes mellitus without complication ([place]) (0981), Dysrhythmia, Gestational diabetes, Headache(784.0), Heartburn in pregnancy, Maternal iron deficiency anemia (01/06/2016), MS (multiple sclerosis) ([place]), Postpartum care following cesarean delivery (5/11) (01/05/2016), Postpartum hypertension (03/07/2019), POTS (postural orthostatic tachycardia syndrome), SVT (supraventricular tachycardia) ([place]), Vaginal Pap smear, abnormal, and Vitamin deficiency.   Family medical /sleep history: Her mother is followed for MS there and a brother has celiac disease- dx at age 49 .   Social history:  Patient is married, mother of 4 and working as an Health visitor.  She  lives in a household with 6 persons. The patient currently works in shifts( night/ rotating) Saturday and Sunday,  [employer].   Pets are present- One dog.  Tobacco use: never .  ETOH use; haven't in 5.5 years, always with a baby, nursing. , Caffeine intake in form of Coffee( none) Soda( 3-4) Tea ( none) or energy drinks. Regular exercise in form of walking- she had paused after delivery- had a lot of blood pressure issues Peri-delivery and since delivery- was on labetalol, then magnesium, now nifedipine.   Sleep affected by nursing , and soon returning to work.      Review of Systems: Out of a complete 14 system review, the patient complains of only the following symptoms, and all other reviewed systems are negative:    First  MS ; Superficial numbness on the right leg- felt it when shaving, felt tired, not well tolerant of heat.  Last relapse was dysesthesias on the right leg, a feeling of water dripping down her leg- dizziness, vertigo- with  head turnning. c spine lesions found after last elapse.  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  Tobacco Use   Smoking status: Never   Smokeless tobacco: Never  Vaping Use   Vaping Use: Never used  Substance and Sexual Activity   Alcohol use: No   Drug use: No   Sexual activity: Yes    Birth control/protection: Pill    Comment: pregnant  Other Topics Concern   Not on file  Social History Narrative   Soda daily   Right handed    Lives with husband and 4 kids   She is ER Marine scientist       Social Determinants of Health   Financial Resource Strain: Not on file  Food Insecurity: Not on file  Transportation Needs: Not on file  Physical Activity: Not on file  Stress: Not on file  Social Connections: Not on file    Family History  Problem Relation Age of Onset   Multiple sclerosis Mother    Migraines Mother    Emphysema Father    Heart disease Father    Migraines Father    Migraines Sister    Spina bifida Brother        half brother   Cancer Paternal Grandfather        LUNG   Diabetes Maternal Grandmother     Past Medical History:  Diagnosis Date   Acute blood loss anemia 01/06/2016   Anemia    Diabetes mellitus without complication ([place]) 2952   Gestational diabetes only.   Dysrhythmia    Gestational diabetes    2nd preg only   Headache(784.0)    otc med prn   Heartburn in pregnancy    Maternal iron deficiency anemia 01/06/2016   MS (multiple sclerosis) ([place])    Postpartum care following cesarean delivery (5/11) 01/05/2016   Postpartum  hypertension 03/07/2019   POTS (postural orthostatic tachycardia syndrome)    SVT (supraventricular tachycardia) ([place])    No problems since 10/2012 - no med needed   Vaginal Pap smear, abnormal    Vitamin deficiency     Past Surgical History:  Procedure Laterality Date   ATRIAL ABLATION SURGERY  2003   FAILED   CESAREAN SECTION  2006   [place]   CESAREAN SECTION  2006   breech   CESAREAN  SECTION N/A 12/19/2013   Procedure: REPEAT CESAREAN SECTION;  Surgeon: Lovenia Kim, MD;  Location: [facility];  Service: Obstetrics;  Laterality: N/A;  EDD: 12/25/13   CESAREAN SECTION N/A 01/05/2016   Procedure: Repeat CESAREAN SECTION;  Surgeon: Brien Few, MD;  Location: [facility];  Service: Obstetrics;  Laterality: N/A;  EDD: 01/12/16    CESAREAN SECTION WITH BILATERAL TUBAL LIGATION Bilateral 02/25/2019   Procedure: Repeat CESAREAN SECTION WITH BILATERAL TUBAL LIGATION;  Surgeon: Brien Few, MD;  Location: [facility];  Service: Obstetrics;  Laterality: Bilateral;  [place], RNFA  EDD: 03/04/19 Allergy: Adhesive, Paxil, Cipro   CHOLECYSTECTOMY N/A 08/13/2019   Procedure: LAPAROSCOPIC CHOLECYSTECTOMY WITH  CHOLANGIOGRAM;  Surgeon: Greer Pickerel, MD;  Location: [facility];  Service: General;  Laterality: N/A;   COLPOSCOPY     WISDOM TOOTH EXTRACTION       Current Outpatient Medications on File Prior to Visit  Medication Sig Dispense Refill   acetaminophen (TYLENOL) 500 MG tablet Take 1,000 mg by mouth every 6 (six) hours as needed for moderate pain or headache.     ibuprofen (ADVIL) 200 MG tablet Take 800 mg by mouth every 6 (six) hours as needed for headache or mild pain.     LORazepam (ATIVAN) 0.5 MG tablet Take 1 tablet (0.5 mg total) by mouth 2 (two) times daily as needed for anxiety. 8 tablet 0   No current facility-administered medications on file prior to visit.    Allergies  Allergen Reactions   Ciprofloxacin Rash and Other (See Comments)    Has patient had a PCN reaction causing  immediate rash, facial/tongue/throat swelling, SOB or lightheadedness with hypotension: No Has patient had a PCN reaction causing severe rash involving mucus membranes or skin necrosis: No Has patient had a PCN reaction that required hospitalization No Has patient had a PCN reaction occurring within the last 10 years: Yes If all of the above answers are "NO", then may proceed with Cephalosporin use.    CLINICAL DATA:  Multiple sclerosis. Dizziness and near-syncope when turning head. New neurologic event.   IMPRESSION: 1. Stable appearance of multiple T2/FLAIR hyperintensities involving the supratentorial cerebral white matter, consistent with known history of multiple sclerosis. No new lesions or evidence for active demyelination. 2. No other acute intracranial abnormality.     Electronically Signed   By: Jeannine Boga M.D.   On: 08/04/2021 04:16      Abbott, Karen Lovings, NP [facility].  - 10/29/2017 10:00 AM EST Formatting of this note might be different from the original. Karen Powers is a 41 y.o. female with relapsing remitting multiple sclerosis currently off disease modifying therapy who returns to the outpatient Neurology clinic for follow up. She had been on copaxone in the past but stopped when she became pregnant. She had a baby boy in April 2015 and breast fed. The plan was for her to restart Copaxone but she was lost to follow up until seen by Dr. Shelia Media in October 2018. In the interim she had a second baby boy in May 2017. She was last seen earlier this month and shared with me that she was planning to try to have another baby in May when her youngest child will be 54 years old. The decision was made to hold off on restarting disease modifying therapy due to planned pregnancy.  Current status:  She is seen today for a hospital follow up. She was hospitalized at [facility] last week with a MS relapse. She  reported numbness in both legs and vertigo. She was treated  with a 3 day course of IV solumedrol which she tolerated well. Reports of imaging viewable in care everywhere. Brain MRI stable with no new lesions. Cervical spine MRI shows 2 new lesions which were not present on MRI done in 11/18. There was no mention of enhancement with either lesion. She also had a CTA of the head and neck with no arterial abnormality detected.  She tells me that she is some improved since treatment with steroids but continues to have vertigo if she turns her head to either side or looks down and back up. The numbness in her egs is also improved. 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      Physical exam:  Today's Vitals   09/21/21 1532  BP: 116/76  Pulse: 82  SpO2: 97%  Weight: 157 lb (71.2 kg)  Height: 5\' 10"  (1.778 m)   Body mass index is 22.53 kg/m.   Wt Readings from Last 3 Encounters:  09/21/21 157 lb (71.2 kg)  09/19/21 157 lb (71.2 kg)  06/06/21 149 lb (67.6 kg)     Ht Readings from Last 3 Encounters:  09/21/21 5\' 10"  (1.778 m)  09/19/21 5\' 10"  (1.778 m)  06/06/21 5\' 10"  (1.778 m)      General: The patient is awake, alert and appears not in acute distress. The patient is well groomed. Head: Normocephalic, atraumatic. Neck is supple.   Nasal airflow patent.   Dental status: intact. Cardiovascular:  irregular rate and cardiac rhythm by pulse, without distended neck veins. Respiratory: Lungs are clear to auscultation.  Skin:  Without evidence of ankle edema, or rash.  Trunk: The patient's posture is erect.   Neurologic exam : The patient is awake and alert, oriented to place and time.   Memory subjective described as intact.  Attention span & concentration ability appears normal.  Speech is fluent,  without  dysarthria, dysphonia or aphasia.  Mood and affect are appropriate.   Cranial nerves: no loss of smell or taste reported  Pupils are equal and briskly reactive to light. Funduscopic exam deferred. No diplopia.  Trouble reading screens.    Extraocular movements in vertical and horizontal planes were intact and without nystagmus. No Diplopia. Visual fields by finger perimetry are intact. Hearing was intact to soft voice and finger rubbing.    Facial sensation intact to fine touch.  Facial motor strength is symmetric and tongue and uvula move midline.  Neck ROM : rotation, tilt and flexion extension were normal for age and shoulder shrug was symmetrical.     Motor exam:  Symmetric bulk, tone and ROM.   Normal tone without cog wheeling, symmetric grip strength .   Sensory:  Fine touch and vibration were tested  and  normal.  Proprioception tested in the upper extremities was normal.   Coordination: Rapid alternating movements in the fingers/hands were of normal speed.  The Finger-to-nose maneuver was intact without evidence of ataxia, dysmetria or tremor. No pronator drift.    Gait and station: Patient could rise unassisted from a seated position,  walked without assistive device.  Stance is of normal width/ base and the patient turned with steps.  Toe and heel walk were deferred.  Romberg negative. Not swaying.  Deep tendon reflexes: in the upper and lower extremities are symmetric and intact.  Babinski response was deferred.      After spending a total time of 45 minutes face to face  and additional time for physical and neurologic examination, review of laboratory studies,  personal review of imaging studies, reports and results of other testing and review of referral information / records as far as provided in visit, I have established the following assessments:   She was interested in [place] po, but hadn't started since the birth of her 4 rth baby. She has older, remote cervical cord lesions, NO NEW LESIONS>   cervical spine, thoracic spine, lumbar spine and brain.    1) MS diagnosis,  Plan : I will refer the patient to Dr Felecia Shelling-    I understand her reluctance to go on medication since nurse power has been  very mild case.  Basically there were symptoms at the time of diagnosis in 2009 there was a possible relapse and 2018 and we are now in 2022 looking back on a possible relapse that did not believe any acute demyelinating lesions by MRI of brain and all segments of the spine. She had a CSF test by Dr Ron Parker, reviewed by Dr Gara Kroner, [facility] 2009.    I would like to thank Mackie Pai, [address] [place],  [place] [id number] for allowing me to meet with and to take care of this pleasant patient.   Electronically signed by: Larey Seat, MD 09/21/2021 4:17 PM  [facility] Board certified by The [employer] of Sleep Medicine and Diplomate of the [employer] of Sleep Medicine. Board certified In Neurology through the [place], Fellow of the [employer] of Neurology. Medical Director of [employer] [facility].

## 2021-09-21 NOTE — Addendum Note (Signed)
Addended by: Jeronimo Greaves on: 09/21/2021 10:24 AM   Modules accepted: Orders

## 2021-09-22 LAB — VITAMIN D 1,25 DIHYDROXY
Vitamin D 1, 25 (OH)2 Total: 34 pg/mL (ref 18–72)
Vitamin D2 1, 25 (OH)2: <8 pg/mL
Vitamin D3 1, 25 (OH)2: 34 pg/mL

## 2021-09-25 ENCOUNTER — Ambulatory Visit: Payer: 59

## 2021-10-02 ENCOUNTER — Ambulatory Visit: Payer: 59

## 2021-10-09 ENCOUNTER — Ambulatory Visit: Payer: 59

## 2021-10-16 ENCOUNTER — Ambulatory Visit: Payer: 59

## 2021-11-07 ENCOUNTER — Telehealth: Payer: Self-pay | Admitting: *Deleted

## 2021-11-07 ENCOUNTER — Other Ambulatory Visit: Payer: Self-pay

## 2021-11-07 ENCOUNTER — Encounter: Payer: Self-pay | Admitting: Neurology

## 2021-11-07 ENCOUNTER — Ambulatory Visit: Payer: 59 | Admitting: Neurology

## 2021-11-07 VITALS — BP 119/79 | HR 75 | Ht 69.5 in | Wt 156.0 lb

## 2021-11-07 DIAGNOSIS — Z79899 Other long term (current) drug therapy: Secondary | ICD-10-CM | POA: Insufficient documentation

## 2021-11-07 DIAGNOSIS — G35 Multiple sclerosis: Secondary | ICD-10-CM

## 2021-11-07 DIAGNOSIS — R200 Anesthesia of skin: Secondary | ICD-10-CM | POA: Diagnosis not present

## 2021-11-07 NOTE — Telephone Encounter (Signed)
MD approved sending in Vumerity start form. Pt had recent labs that looked ok. If Vumerity not covered by her insurance, will try and do dimethyl fumarate instead. ? ?Faxed completed/signed Vumerity start form to [employer] at [phone]. Received fax confirmation. ? ?

## 2021-11-07 NOTE — Progress Notes (Signed)
[facility]  PATIENT: Karen Powers DOB: [birth year]  REFERRING DOCTOR OR PCP: Esperanza Richters, PA-C SOURCE: Patient, notes from neurology, imaging and laboratory reports, MRI images personally reviewed.  _________________________________   HISTORICAL  CHIEF COMPLAINT:  Chief Complaint  Patient presents with   New Patient (Initial Visit)    Pt alone, rm 10. Diagnosed in 2009 with MS. After reviewing in 14 yrs since diagnosis she has only had to get iv steroids 3 day course 1 time. She has only tried copaxone several years ago. She is currently off treatment and is doing well. Her mother also was diagnosed with MS. Imaging was completed in December 2022 when she went to ER for neuro symptoms. LP and MRI was completed in 2009 which is when she was diagnosed.    HISTORY OF PRESENT ILLNESS:  I had the pleasure of seeing the patient, Karen Powers, at the [facility] at [facility] for neurologic consultation regarding her multiple sclerosis and treatment options.  She is a 41 year old woman who was diagnosed with MS in 2009 at age 41.   In September 2009, she was experiencing numbness in her right leg,   Her mother had MS prompting an evaluation with an MRI showing several periventricular T2 hyperintense foci and a focus in the left cerebral peduncle.    CSF was analyzed and was also consistent with MS,   She was diagnosed by Dr. Orlin Hilding and then started to see Dr. Leotis Shames [place]   She was started on Copaxone but only stayed on a couple years.   She was starting a family and had several more prognosis over the next 10 years..  She was seen Dr. Renne Crigler at [facility]  In 2018, she had some visual changes and initiation of a disease modifying therapy was recommended though she did not start due to insurance issues., She had additional neurologic symptoms January or February 2019.   She noted that when she would turn her head to the right, she would get a wet  sensation in her right leg.    She then had an episode of severe vertigo a week or two later that persisted a couple weeks.  MRI 10/22/2017 showed several enhancing lesions in the cervical spine and no new lesions in the brain.  She received a few days of IV Solumedrol and was better 1-2 weeks later and symptoms resolved a week or two after that.     She was planning an additional pregnancy so held off a treatment   In December 2022, she felt more off balanced but MRI brain and spine showed no new lesions.   She has since improved and is back at baseline  Currently, she has fatigue and some mild mental fog but no major neurologic symptom.  She has paresthesias lasting a day or a week that are mild.  These are in her legs mostly, rarely in the arms.    Vision is ok though she has trouble focusing on words at time.    Her OD vision is generally worse than OS.  Eye exams have not shown evidence of MS in the past.      She has noted issues with cognition, specifically in conversation coming up with a word or having trouble remembering what she was to say or do.   She works nights on weekends and sleep is variable.   She denies significant depression but occasionally feels down.       She has  4 children born 2006, 2015, 2017, and 2020.   She is not planning additional children  Imaging:  MRI of the brain 08/04/2021 showed scattered T2/FLAIR hyperintense foci predominantly in the periventricular white matter.  None of the foci enhance or appear to be acute.  No new lesions compared to 10/22/2017  MRI of the cervical and thoracic spine 08/04/2021 showed subtle T2 hyperintense foci in the upper cervical spine, with improved appearance compared to the 2019 MRI.  No enhancing lesions.  No additional lesions.  The MRI of the thoracic spine did not show any MS lesions.  MRI of the cervical spine 10/22/2017 showed several enhancing lesions in the cervical spine, to the left adjacent to C2, anteriorly adjacent to C2 and  to the right adjacent to C2-C3.  An additional nonenhancing lesion is noted to the left adjacent to C4.  None of these foci were present on the MRI from 07/13/2017 (normal spinal cord)  MRI of the brain 10/22/2017 showed scattered T2/FLAIR hyperintense foci in the hemispheres, predominantly in the periventricular white matter.  None of the foci enhance.  No definite new lesions compared to 07/13/2017.  MRI of the brain 07/13/2017 showed scattered T2/FLAIR hyperintense foci.  Dominantly in the periventricular white matter with a subtle focus in the left cerebral peduncle.  There has been mild progression compared to the 10/20/2010 MRI.  MRI of the brain 10/20/2010 and 05/17/2008 showed T2/FLAIR hyperintense foci predominantly in the periventricular white matter.  These 2 studies were stable.  REVIEW OF SYSTEMS: Constitutional: No fevers, chills, sweats, or change in appetite Eyes: No visual changes, double vision, eye pain Ear, nose and throat: No hearing loss, ear pain, nasal congestion, sore throat Cardiovascular: No chest pain, palpitations Respiratory:  No shortness of breath at rest or with exertion.   No wheezes GastrointestinaI: No nausea, vomiting, diarrhea, abdominal pain, fecal incontinence Genitourinary:  No dysuria, urinary retention or frequency.  No nocturia. Musculoskeletal:  No neck pain, back pain Integumentary: No rash, pruritus, skin lesions Neurological: as above Psychiatric: No depression at this time.  No anxiety Endocrine: No palpitations, diaphoresis, change in appetite, change in weigh or increased thirst Hematologic/Lymphatic:  No anemia, purpura, petechiae. Allergic/Immunologic: No itchy/runny eyes, nasal congestion, recent allergic reactions, rashes  ALLERGIES: Allergies  Allergen Reactions   Ciprofloxacin Rash and Other (See Comments)    Has patient had a PCN reaction causing immediate rash, facial/tongue/throat swelling, SOB or lightheadedness with hypotension:  No Has patient had a PCN reaction causing severe rash involving mucus membranes or skin necrosis: No Has patient had a PCN reaction that required hospitalization No Has patient had a PCN reaction occurring within the last 10 years: Yes If all of the above answers are "NO", then may proceed with Cephalosporin use.    HOME MEDICATIONS:  Current Outpatient Medications:    acetaminophen (TYLENOL) 500 MG tablet, Take 1,000 mg by mouth every 6 (six) hours as needed for moderate pain or headache., Disp: , Rfl:    cyanocobalamin (,VITAMIN B-12,) 1000 MCG/ML injection, Inject 1 ml monthly for 5 months, Disp: 5 mL, Rfl: 0   ibuprofen (ADVIL) 200 MG tablet, Take 800 mg by mouth every 6 (six) hours as needed for headache or mild pain., Disp: , Rfl:    LORazepam (ATIVAN) 0.5 MG tablet, Take 1 tablet (0.5 mg total) by mouth 2 (two) times daily as needed for anxiety., Disp: 8 tablet, Rfl: 0   SYRINGE-NEEDLE, DISP, 3 ML (B-D 3CC LUER-LOK SYR 25GX5/8") 25G X 5/8"  3 ML MISC, Use as directed weekly with B12., Disp: 10 each, Rfl: 0  PAST MEDICAL HISTORY: Past Medical History:  Diagnosis Date   Acute blood loss anemia 01/06/2016   Anemia    Diabetes mellitus without complication (HCC) 2015   Gestational diabetes only.   Dysrhythmia    Gestational diabetes    2nd preg only   Headache(784.0)    otc med prn   Heartburn in pregnancy    Maternal iron deficiency anemia 01/06/2016   MS (multiple sclerosis) (HCC)    Postpartum care following cesarean delivery (5/11) 01/05/2016   Postpartum hypertension 03/07/2019   POTS (postural orthostatic tachycardia syndrome)    SVT (supraventricular tachycardia) (HCC)    No problems since 10/2012 - no med needed   Vaginal Pap smear, abnormal    Vitamin deficiency     PAST SURGICAL HISTORY: Past Surgical History:  Procedure Laterality Date   ATRIAL ABLATION SURGERY  2003   FAILED   CESAREAN SECTION  2006   Breech   CESAREAN SECTION  2006   breech   CESAREAN  SECTION N/A 12/19/2013   Procedure: REPEAT CESAREAN SECTION;  Surgeon: Lenoard Aden, MD;  Location: [facility];  Service: Obstetrics;  Laterality: N/A;  EDD: 12/25/13   CESAREAN SECTION N/A 01/05/2016   Procedure: Repeat CESAREAN SECTION;  Surgeon: Olivia Mackie, MD;  Location: [facility];  Service: Obstetrics;  Laterality: N/A;  EDD: 01/12/16    CESAREAN SECTION WITH BILATERAL TUBAL LIGATION Bilateral 02/25/2019   Procedure: Repeat CESAREAN SECTION WITH BILATERAL TUBAL LIGATION;  Surgeon: Olivia Mackie, MD;  Location: [facility];  Service: Obstetrics;  Laterality: Bilateral;  Heather, RNFA  EDD: 03/04/19 Allergy: Adhesive, Paxil, Cipro   CHOLECYSTECTOMY N/A 08/13/2019   Procedure: LAPAROSCOPIC CHOLECYSTECTOMY WITH  CHOLANGIOGRAM;  Surgeon: Gaynelle Adu, MD;  Location: [facility];  Service: General;  Laterality: N/A;   COLPOSCOPY     WISDOM TOOTH EXTRACTION      FAMILY HISTORY: Family History  Problem Relation Age of Onset   Multiple sclerosis Mother    Migraines Mother    Emphysema Father    Heart disease Father    Migraines Father    Migraines Sister    Spina bifida Brother        half brother   Cancer Paternal Grandfather        LUNG   Diabetes Maternal Grandmother     SOCIAL HISTORY:  Social History   Socioeconomic History   Marital status: Married    Spouse name: Houda Annear   Number of children: 4   Years of education: Not on file   Highest education level: Not on file  Occupational History   Occupation: [employer] nurse  Tobacco Use   Smoking status: Never   Smokeless tobacco: Never  Vaping Use   Vaping Use: Never used  Substance and Sexual Activity   Alcohol use: No   Drug use: No   Sexual activity: Yes    Birth control/protection: Pill    Comment: pregnant  Other Topics Concern   Not on file  Social History Narrative   Soda daily   Right handed    Lives with husband and 4 kids   She is ER nurse       Social Determinants of Health    Financial Resource Strain: Not on file  Food Insecurity: Not on file  Transportation Needs: Not on file  Physical Activity: Not on file  Stress: Not on file  Social  Connections: Not on file  Intimate Partner Violence: Not on file     PHYSICAL EXAM  Vitals:   11/07/21 0857  BP: 119/79  Pulse: 75  Weight: 156 lb (70.8 kg)  Height: 5' 9.5" (1.765 m)    Body mass index is 22.71 kg/m.   General: The patient is well-developed and well-nourished and in no acute distress  HEENT:  Head is [place]/AT.  Sclera are anicteric.  Funduscopic exam shows normal optic discs and retinal vessels.  Neck: No carotid bruits are noted.  The neck is nontender.  Cardiovascular: The heart has a regular rate and rhythm with a normal S1 and S2. There were no murmurs, gallops or rubs.    Skin: Extremities are without rash or  edema.  Musculoskeletal:  Back is nontender  Neurologic Exam  Mental status: The patient is alert and oriented x 3 at the time of the examination. The patient has apparent normal recent and remote memory, with an apparently normal attention span and concentration ability.   Speech is normal.  Cranial nerves: Extraocular movements are full. Pupils are equal, round, and reactive to light and accomodation.  Visual fields are full.  Facial symmetry is present. There is good facial sensation to soft touch bilaterally.Facial strength is normal.  Trapezius and sternocleidomastoid strength is normal. No dysarthria is noted.  . No obvious hearing deficits are noted.  Motor:  Muscle bulk is normal.   Tone is normal. Strength is  5 / 5 in all 4 extremities.   Sensory: Sensory testing is intact to pinprick, soft touch and vibration sensation in all 4 extremities.  Coordination: Cerebellar testing reveals good finger-nose-finger and heel-to-shin bilaterally.  Gait and station: Station is normal.   Gait is normal. Tandem gait is normal. Romberg is negative.   Reflexes: Deep tendon reflexes  are symmetric and normal bilaterally.       DIAGNOSTIC DATA (LABS, IMAGING, TESTING) - I reviewed patient records, labs, notes, testing and imaging myself where available.  Lab Results  Component Value Date   WBC 4.9 09/19/2021   HGB 11.9 (L) 09/19/2021   HCT 34.9 (L) 09/19/2021   MCV 90.7 09/19/2021   PLT 212.0 09/19/2021      Component Value Date/Time   NA 139 09/19/2021 1040   K 4.2 09/19/2021 1040   CL 106 09/19/2021 1040   CO2 28 09/19/2021 1040   GLUCOSE 92 09/19/2021 1040   BUN 6 09/19/2021 1040   CREATININE 0.65 09/19/2021 1040   CREATININE 0.78 03/30/2020 0838   CALCIUM 8.7 09/19/2021 1040   PROT 6.4 09/19/2021 1040   ALBUMIN 4.3 09/19/2021 1040   AST 14 09/19/2021 1040   ALT 12 09/19/2021 1040   ALKPHOS 56 09/19/2021 1040   BILITOT 0.8 09/19/2021 1040   GFRNONAA >60 08/03/2021 2215   GFRAA >60 08/13/2019 0420   Lab Results  Component Value Date   CHOL 158 09/19/2021   HDL 52.90 09/19/2021   LDLCALC 97 09/19/2021   TRIG 41.0 09/19/2021   CHOLHDL 3 09/19/2021   No results found for: HGBA1C Lab Results  Component Value Date   VITAMINB12 183 (L) 09/19/2021   Lab Results  Component Value Date   TSH 0.97 09/19/2021       ASSESSMENT AND PLAN  Multiple sclerosis (HCC)  Numbness  High risk medication use   In summary, Karen Powers is a 41 year old woman who was diagnosed with MS at age 73 after presenting with sensory symptoms.  She has only been on  medications for about 2 to 3 years and had a fairly benign course until 2019 when she had a more significant exacerbation with 3-4 new lesions in the upper cervical spine, all but 1 enhancing.  Additionally there was some progression on MRI between 2012 and 2018.  I discussed with her that she is showing evidence of some activity with her MS and I strongly encouraged her to begin a disease modifying therapy.  I do not believe she has an aggressive MS opening up multiple possibilities for treatment.  She is  most interested in Vumerity and she signed a service request form.  Recent CBC with differential and CMP were fine.  We also discussed that I would recommend that we image her about 9 to 12 months after beginning therapy and if additional lesions are noted that we consider a more efficacious medication.  She will return to see me in 4 months or sooner if there are new or worsening neurologic symptoms.  Thank you for asking me to see Karen Powers.  Please let me know if I can be of further assistance with her or other patients in the future.   65-minute office visit with the majority of the time spent face-to-face for history and physical, discussion/counseling and decision-making.  Additional time with record review and documentation.    Carthel Castille A. Epimenio Foot, MD, [facility] 11/07/2021, 5:41 PM Certified in Neurology, Clinical Neurophysiology, Sleep Medicine and Neuroimaging  [facility] [address] [place], [place] [address] [phone]

## 2021-11-13 NOTE — Telephone Encounter (Signed)
Completed PA for Vumerity via CMM. Sent to [employer]. Should have a determination within 1-3 business days. Key: [id number]. ?

## 2021-11-13 NOTE — Telephone Encounter (Signed)
PA for Vumerity approved by [employer]. "The request has been approved. The authorization is effective for a maximum of 12 fills from 11/13/2021 to 11/13/2022, as long as the member is enrolled in their current health plan. The request was approved with a quantity restriction. This has been approved for a max daily dosage of 4. A written notification letter will follow with additional details." ?

## 2021-11-14 ENCOUNTER — Other Ambulatory Visit (HOSPITAL_BASED_OUTPATIENT_CLINIC_OR_DEPARTMENT_OTHER): Payer: Self-pay

## 2021-11-16 ENCOUNTER — Encounter: Payer: Self-pay | Admitting: Neurology

## 2021-11-16 NOTE — Telephone Encounter (Signed)
Sent email to [employer] forwarding this information to see if they can help assist with high copay cost. Waiting on response. ?

## 2021-11-16 NOTE — Telephone Encounter (Signed)
[employer]) Able to process medication, attempting to contact the patient. Patient has $250 copay. Patient to express financial assistance Would like a call back. ? ?Contact info: [phone] ?

## 2021-11-16 NOTE — Telephone Encounter (Signed)
Received email back from Crystal:"I got her on the phone" ?

## 2021-11-16 NOTE — Telephone Encounter (Signed)
error 

## 2021-11-21 NOTE — Telephone Encounter (Signed)
Received notice from [employer] that patient's Vumerity has shipped from [employer] ?

## 2021-11-27 NOTE — Telephone Encounter (Signed)
I called patient to find out if she received her Vumerity shipment and if she started taking it. No answer, VM full. ?

## 2021-12-04 NOTE — Telephone Encounter (Signed)
I called patient again to discuss. No answer, VM full. ?

## 2021-12-11 NOTE — Telephone Encounter (Signed)
I called patient again to discuss. No answer, left a message asking her to call me back. ?

## 2021-12-12 NOTE — Telephone Encounter (Signed)
I have tried calling patient 3 times unsuccessfully. I will send her a letter asking her to call us back to find out if she has received Vumerity and is tolerating it well. ?

## 2022-02-21 ENCOUNTER — Other Ambulatory Visit (HOSPITAL_BASED_OUTPATIENT_CLINIC_OR_DEPARTMENT_OTHER): Payer: Self-pay

## 2022-02-21 ENCOUNTER — Ambulatory Visit: Payer: 59 | Admitting: Family

## 2022-02-21 VITALS — BP 124/74 | HR 68 | Temp 98.6°F | Resp 16 | Wt 164.0 lb

## 2022-02-21 DIAGNOSIS — N3000 Acute cystitis without hematuria: Secondary | ICD-10-CM

## 2022-02-21 DIAGNOSIS — R300 Dysuria: Secondary | ICD-10-CM

## 2022-02-21 LAB — POC URINALSYSI DIPSTICK (AUTOMATED)
Bilirubin, UA: NEGATIVE
Blood, UA: NEGATIVE
Glucose, UA: NEGATIVE
Ketones, UA: NEGATIVE
Nitrite, UA: NEGATIVE
Protein, UA: NEGATIVE
Spec Grav, UA: 1.015 (ref 1.010–1.025)
Urobilinogen, UA: 0.2 E.U./dL
pH, UA: 6 (ref 5.0–8.0)

## 2022-02-21 MED ORDER — NITROFURANTOIN MONOHYD MACRO 100 MG PO CAPS
100.0000 mg | ORAL_CAPSULE | Freq: Two times a day (BID) | ORAL | 0 refills | Status: DC
  Filled 2022-02-21: qty 14, 7d supply, fill #0

## 2022-02-21 MED ORDER — ONDANSETRON HCL 4 MG PO TABS
4.0000 mg | ORAL_TABLET | Freq: Three times a day (TID) | ORAL | 0 refills | Status: DC | PRN
  Filled 2022-02-21: qty 20, 7d supply, fill #0

## 2022-02-21 NOTE — Progress Notes (Signed)
Subjective:   By signing my name below, I, Carylon Perches, attest that this documentation has been prepared under the direction and in the presence of Karie Chimera, NP 02/21/2022      Patient ID: Karen Powers, female    DOB: 1981/03/17, 41 y.o.   MRN: [id number]  Chief Complaint  Patient presents with   Dysuria    Here for discomfort and burning with urination    HPI Patient is in today for an office visit  UTI: She complains of a possible UTI. She reports on the afternoon of 02/20/2022, she had urinary frequency and urgency but with small urine output. She denies of any abnormal back pain,  fever or hematuria   Health Maintenance Due  Topic Date Due   Hepatitis C Screening  Never done   PAP SMEAR-Modifier  Never done   COVID-19 Vaccine (4 - Booster for Pfizer series) 07/25/2020    Past Medical History:  Diagnosis Date   Acute blood loss anemia 01/06/2016   Anemia    Diabetes mellitus without complication ([place]) 1275   Gestational diabetes only.   Dysrhythmia    Gestational diabetes    2nd preg only   Headache(784.0)    otc med prn   Heartburn in pregnancy    Maternal iron deficiency anemia 01/06/2016   MS (multiple sclerosis) ([place])    Postpartum care following cesarean delivery (5/11) 01/05/2016   Postpartum hypertension 03/07/2019   POTS (postural orthostatic tachycardia syndrome)    SVT (supraventricular tachycardia) ([place])    No problems since 10/2012 - no med needed   Vaginal Pap smear, abnormal    Vitamin deficiency     Past Surgical History:  Procedure Laterality Date   ATRIAL ABLATION SURGERY  2003   FAILED   CESAREAN SECTION  2006   Breech   CESAREAN SECTION  2006   breech   CESAREAN SECTION N/A 12/19/2013   Procedure: REPEAT CESAREAN SECTION;  Surgeon: Lovenia Kim, MD;  Location: [facility];  Service: Obstetrics;  Laterality: N/A;  EDD: 12/25/13   CESAREAN SECTION N/A 01/05/2016   Procedure: Repeat CESAREAN SECTION;  Surgeon: Brien Few,  MD;  Location: [facility];  Service: Obstetrics;  Laterality: N/A;  EDD: 01/12/16    CESAREAN SECTION WITH BILATERAL TUBAL LIGATION Bilateral 02/25/2019   Procedure: Repeat CESAREAN SECTION WITH BILATERAL TUBAL LIGATION;  Surgeon: Brien Few, MD;  Location: [facility];  Service: Obstetrics;  Laterality: Bilateral;  Heather, RNFA  EDD: 03/04/19 Allergy: Adhesive, Paxil, Cipro   CHOLECYSTECTOMY N/A 08/13/2019   Procedure: LAPAROSCOPIC CHOLECYSTECTOMY WITH  CHOLANGIOGRAM;  Surgeon: Greer Pickerel, MD;  Location: [facility];  Service: General;  Laterality: N/A;   COLPOSCOPY     WISDOM TOOTH EXTRACTION      Family History  Problem Relation Age of Onset   Multiple sclerosis Mother    Migraines Mother    Emphysema Father    Heart disease Father    Migraines Father    Migraines Sister    Spina bifida Brother        half brother   Cancer Paternal Grandfather        LUNG   Diabetes Maternal Grandmother     Social History   Socioeconomic History   Marital status: Married    Spouse name: Deven Audi   Number of children: 4   Years of education: Not on file   Highest education level: Not on file  Occupational History   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  Tobacco Use   Smoking status: Never   Smokeless tobacco: Never  Vaping Use   Vaping Use: Never used  Substance and Sexual Activity   Alcohol use: No   Drug use: No   Sexual activity: Yes    Birth control/protection: Pill    Comment: pregnant  Other Topics Concern   Not on file  Social History Narrative   Soda daily   Right handed    Lives with husband and 4 kids   She is ER nurse       Social Determinants of Health   Financial Resource Strain: Not on file  Food Insecurity: Not on file  Transportation Needs: Not on file  Physical Activity: Not on file  Stress: Not on file  Social Connections: Not on file  Intimate Partner Violence: Not on file    Outpatient Medications Prior to Visit  Medication Sig Dispense  Refill   acetaminophen (TYLENOL) 500 MG tablet Take 1,000 mg by mouth every 6 (six) hours as needed for moderate pain or headache.     cyanocobalamin (,VITAMIN B-12,) 1000 MCG/ML injection Inject 1 ml monthly for 5 months 5 mL 0   ibuprofen (ADVIL) 200 MG tablet Take 800 mg by mouth every 6 (six) hours as needed for headache or mild pain.     LORazepam (ATIVAN) 0.5 MG tablet Take 1 tablet (0.5 mg total) by mouth 2 (two) times daily as needed for anxiety. 8 tablet 0   SYRINGE-NEEDLE, DISP, 3 ML (B-D 3CC LUER-LOK SYR 25GX5/8") 25G X 5/8" 3 ML MISC Use as directed weekly with B12. 10 each 0   VUMERITY 231 MG CPDR Take 2 capsules by mouth 2 (two) times daily.     No facility-administered medications prior to visit.    Allergies  Allergen Reactions   Ciprofloxacin Rash and Other (See Comments)    Has patient had a PCN reaction causing immediate rash, facial/tongue/throat swelling, SOB or lightheadedness with hypotension: No Has patient had a PCN reaction causing severe rash involving mucus membranes or skin necrosis: No Has patient had a PCN reaction that required hospitalization No Has patient had a PCN reaction occurring within the last 10 years: Yes If all of the above answers are "NO", then may proceed with Cephalosporin use.    Review of Systems  Constitutional:  Negative for fever.  Genitourinary:  Positive for frequency and urgency. Negative for hematuria.  Musculoskeletal:  Negative for back pain.       Objective:    Physical Exam Constitutional:      General: She is not in acute distress.    Appearance: Normal appearance. She is not ill-appearing.  HENT:     Head: Normocephalic and atraumatic.     Right Ear: External ear normal.     Left Ear: External ear normal.  Eyes:     Extraocular Movements: Extraocular movements intact.     Pupils: Pupils are equal, round, and reactive to light.  Abdominal:     Tenderness: There is no right CVA tenderness or left CVA tenderness.   Skin:    General: Skin is warm and dry.  Neurological:     Mental Status: She is alert and oriented to person, place, and time.  Psychiatric:        Mood and Affect: Mood normal.        Behavior: Behavior normal.        Judgment: Judgment normal.     BP 124/74 (BP Location: Right Arm,  Patient Position: Sitting, Cuff Size: Small)   Pulse 68   Temp 98.6 F (37 C) (Oral)   Resp 16   Wt 164 lb (74.4 kg)   SpO2 100%   BMI 23.87 kg/m  Wt Readings from Last 3 Encounters:  02/21/22 164 lb (74.4 kg)  11/07/21 156 lb (70.8 kg)  09/21/21 157 lb (71.2 kg)       Assessment & Plan:   Problem List Items Addressed This Visit       Unprioritized   Acute cystitis without hematuria    UA + leuks. Will send for culture. Rx with macrobid bid x 7 days. Pt requesting rx for zofran in case she develops nausea. rx sent.       Other Visit Diagnoses     Dysuria    -  Primary   Relevant Orders   POCT Urinalysis Dipstick (Automated) (Completed)   Urine Culture       Meds ordered this encounter  Medications   nitrofurantoin, macrocrystal-monohydrate, (MACROBID) 100 MG capsule    Sig: Take 1 capsule (100 mg total) by mouth 2 (two) times daily.    Dispense:  14 capsule    Refill:  0    Order Specific Question:   Supervising Provider    Answer:   Penni Homans A [[id number]]   ondansetron (ZOFRAN) 4 MG tablet    Sig: Take 1 tablet (4 mg total) by mouth every 8 (eight) hours as needed for nausea or vomiting.    Dispense:  20 tablet    Refill:  0    Order Specific Question:   Supervising Provider    Answer:   Penni Homans A [[id number]]    I, Nance Pear, NP, personally preformed the services described in this documentation.  All medical record entries made by the scribe were at my direction and in my presence.  I have reviewed the chart and discharge instructions (if applicable) and agree that the record reflects my personal performance and is accurate and complete.  02/21/2022   I,Amber Collins,acting as a scribe for Nance Pear, NP.,have documented all relevant documentation on the behalf of Nance Pear, NP,as directed by  Nance Pear, NP while in the presence of Nance Pear, NP.   Nance Pear, NP

## 2022-02-21 NOTE — Patient Instructions (Signed)
Please start macrobid twice daily for urinary tract infection.  Call if symptoms worsen or if no improvement in 2-3 days.

## 2022-02-21 NOTE — Assessment & Plan Note (Signed)
UA + leuks. Will send for culture. Rx with macrobid bid x 7 days. Pt requesting rx for zofran in case she develops nausea. rx sent.

## 2022-02-23 LAB — URINE CULTURE
MICRO NUMBER:: 13583539
SPECIMEN QUALITY:: ADEQUATE

## 2022-02-28 ENCOUNTER — Encounter: Payer: Self-pay | Admitting: Medical

## 2022-02-28 MED ORDER — LORAZEPAM 0.5 MG PO TABS
0.5000 mg | ORAL_TABLET | Freq: Two times a day (BID) | ORAL | 0 refills | Status: DC | PRN
  Filled 2022-02-28: qty 8, 4d supply, fill #0

## 2022-02-28 NOTE — Addendum Note (Signed)
Addended by: Anabel Halon on: 02/28/2022 07:14 PM   Modules accepted: Orders

## 2022-03-01 ENCOUNTER — Other Ambulatory Visit (HOSPITAL_BASED_OUTPATIENT_CLINIC_OR_DEPARTMENT_OTHER): Payer: Self-pay

## 2022-03-09 ENCOUNTER — Other Ambulatory Visit (HOSPITAL_BASED_OUTPATIENT_CLINIC_OR_DEPARTMENT_OTHER): Payer: Self-pay

## 2022-03-13 ENCOUNTER — Other Ambulatory Visit (HOSPITAL_BASED_OUTPATIENT_CLINIC_OR_DEPARTMENT_OTHER): Payer: Self-pay

## 2022-03-20 ENCOUNTER — Ambulatory Visit: Payer: 59 | Admitting: Medical

## 2022-03-21 ENCOUNTER — Ambulatory Visit: Payer: 59 | Admitting: Medical

## 2022-03-21 ENCOUNTER — Other Ambulatory Visit (HOSPITAL_BASED_OUTPATIENT_CLINIC_OR_DEPARTMENT_OTHER): Payer: Self-pay

## 2022-03-21 VITALS — BP 110/70 | HR 98 | Temp 98.6°F | Resp 18 | Ht 69.5 in | Wt 157.8 lb

## 2022-03-21 DIAGNOSIS — Z1211 Encounter for screening for malignant neoplasm of colon: Secondary | ICD-10-CM

## 2022-03-21 DIAGNOSIS — Z83719 Family history of colon polyps, unspecified: Secondary | ICD-10-CM

## 2022-03-21 DIAGNOSIS — Z800 Family history of malignant neoplasm of digestive organs: Secondary | ICD-10-CM | POA: Diagnosis not present

## 2022-03-21 DIAGNOSIS — D696 Thrombocytopenia, unspecified: Secondary | ICD-10-CM

## 2022-03-21 DIAGNOSIS — R300 Dysuria: Secondary | ICD-10-CM

## 2022-03-21 DIAGNOSIS — Z8371 Family history of colonic polyps: Secondary | ICD-10-CM

## 2022-03-21 DIAGNOSIS — E538 Deficiency of other specified B group vitamins: Secondary | ICD-10-CM

## 2022-03-21 LAB — POC URINALSYSI DIPSTICK (AUTOMATED)
Glucose, UA: NEGATIVE
Ketones, UA: POSITIVE
Nitrite, UA: NEGATIVE
Protein, UA: POSITIVE — AB
Spec Grav, UA: >=1.03 — AB (ref 1.010–1.025)
Urobilinogen, UA: 0.2 E.U./dL
pH, UA: 5 (ref 5.0–8.0)

## 2022-03-21 MED ORDER — CEFTRIAXONE SODIUM 1 G IJ SOLR
1.0000 g | Freq: Once | INTRAMUSCULAR | Status: AC
  Administered 2022-03-21: 1 g via INTRAMUSCULAR

## 2022-03-21 MED ORDER — FLUCONAZOLE 150 MG PO TABS
150.0000 mg | ORAL_TABLET | Freq: Every day | ORAL | 0 refills | Status: DC
  Filled 2022-03-21: qty 1, 1d supply, fill #0

## 2022-03-21 MED ORDER — CEPHALEXIN 500 MG PO CAPS
500.0000 mg | ORAL_CAPSULE | Freq: Three times a day (TID) | ORAL | 0 refills | Status: DC
  Filled 2022-03-21: qty 21, 7d supply, fill #0

## 2022-03-21 NOTE — Progress Notes (Signed)
Subjective:    Patient ID: Karen Powers, female    DOB: 08-Aug-1981, 41 y.o.   MRN: [id number]  HPI Pt in today reporting urinary symptoms for3 days  Dysuria- yes Frequent urination-yes Hesitancy-no Suprapubic pressure-yes Fever-no chills-no Nausea-no Vomiting-no CVA pain-no History of UTI-yes Gross hematuria- no  Pt states Sunday held her bladder for prolonged time at work.  One month or so got macrobid for culture + urine. Macrobid did help.  Lmp- recent. Tubal ligation.March 11, 2022.   Review of Systems  Constitutional:  Negative for chills, fatigue and fever.  HENT:  Negative for congestion.   Respiratory:  Negative for cough, chest tightness, shortness of breath and wheezing.   Cardiovascular:  Negative for chest pain and palpitations.  Gastrointestinal:  Negative for abdominal pain, diarrhea and rectal pain.  Genitourinary:  Positive for dysuria and frequency.  Musculoskeletal:  Negative for back pain.  Neurological:  Negative for dizziness, speech difficulty, weakness, numbness and headaches.  Hematological:  Negative for adenopathy. Does not bruise/bleed easily.  Psychiatric/Behavioral:  Negative for behavioral problems, decreased concentration and dysphoric mood.     Past Medical History:  Diagnosis Date   Acute blood loss anemia 01/06/2016   Anemia    Diabetes mellitus without complication ([place]) 0086   Gestational diabetes only.   Dysrhythmia    Gestational diabetes    2nd preg only   Headache(784.0)    otc med prn   Heartburn in pregnancy    Maternal iron deficiency anemia 01/06/2016   MS (multiple sclerosis) ([facility])    Postpartum care following cesarean delivery (5/11) 01/05/2016   Postpartum hypertension 03/07/2019   POTS (postural orthostatic tachycardia syndrome)    SVT (supraventricular tachycardia) ([place])    No problems since 10/2012 - no med needed   Vaginal Pap smear, abnormal    Vitamin deficiency      Social History   Socioeconomic  History   Marital status: Married    Spouse name: Elisabella Hacker   Number of children: 4   Years of education: Not on file   Highest education level: Not on file  Occupational History   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  Tobacco Use   Smoking status: Never   Smokeless tobacco: Never  Vaping Use   Vaping Use: Never used  Substance and Sexual Activity   Alcohol use: No   Drug use: No   Sexual activity: Yes    Birth control/protection: Pill    Comment: pregnant  Other Topics Concern   Not on file  Social History Narrative   Soda daily   Right handed    Lives with husband and 4 kids   She is ER nurse       Social Determinants of Health   Financial Resource Strain: Not on file  Food Insecurity: Not on file  Transportation Needs: Not on file  Physical Activity: Not on file  Stress: Not on file  Social Connections: Not on file  Intimate Partner Violence: Not on file    Past Surgical History:  Procedure Laterality Date   [place]  2006   Breech   CESAREAN SECTION  2006   breech   CESAREAN SECTION N/A 12/19/2013   Procedure: REPEAT CESAREAN SECTION;  Surgeon: Lovenia Kim, MD;  Location: [facility];  Service: Obstetrics;  Laterality: N/A;  EDD: 12/25/13   CESAREAN SECTION N/A 01/05/2016   Procedure: Repeat CESAREAN SECTION;  Surgeon: Brien Few,  MD;  Location: [facility];  Service: Obstetrics;  Laterality: N/A;  EDD: 01/12/16    CESAREAN SECTION WITH BILATERAL TUBAL LIGATION Bilateral 02/25/2019   Procedure: Repeat CESAREAN SECTION WITH BILATERAL TUBAL LIGATION;  Surgeon: Brien Few, MD;  Location: [facility];  Service: Obstetrics;  Laterality: Bilateral;  Heather, RNFA  EDD: 03/04/19 Allergy: Adhesive, Paxil, Cipro   CHOLECYSTECTOMY N/A 08/13/2019   Procedure: LAPAROSCOPIC CHOLECYSTECTOMY WITH  CHOLANGIOGRAM;  Surgeon: Greer Pickerel, MD;  Location: [facility];  Service: General;  Laterality: N/A;   COLPOSCOPY      WISDOM TOOTH EXTRACTION      Family History  Problem Relation Age of Onset   Multiple sclerosis Mother    Migraines Mother    Emphysema Father    Heart disease Father    Migraines Father    Migraines Sister    Spina bifida Brother        half brother   Cancer Paternal Grandfather        LUNG   Diabetes Maternal Grandmother     Allergies  Allergen Reactions   Ciprofloxacin Rash and Other (See Comments)    Has patient had a PCN reaction causing immediate rash, facial/tongue/throat swelling, SOB or lightheadedness with hypotension: No Has patient had a PCN reaction causing severe rash involving mucus membranes or skin necrosis: No Has patient had a PCN reaction that required hospitalization No Has patient had a PCN reaction occurring within the last 10 years: Yes If all of the above answers are "NO", then may proceed with Cephalosporin use.    Current Outpatient Medications on File Prior to Visit  Medication Sig Dispense Refill   acetaminophen (TYLENOL) 500 MG tablet Take 1,000 mg by mouth every 6 (six) hours as needed for moderate pain or headache.     cyanocobalamin (,VITAMIN B-12,) 1000 MCG/ML injection Inject 1 ml monthly for 5 months 5 mL 0   ibuprofen (ADVIL) 200 MG tablet Take 800 mg by mouth every 6 (six) hours as needed for headache or mild pain.     LORazepam (ATIVAN) 0.5 MG tablet Take 1 tablet (0.5 mg total) by mouth 2 (two) times daily as needed for anxiety. 8 tablet 0   ondansetron (ZOFRAN) 4 MG tablet Take 1 tablet (4 mg total) by mouth every 8 (eight) hours as needed for nausea or vomiting. 20 tablet 0   SYRINGE-NEEDLE, DISP, 3 ML (B-D 3CC LUER-LOK SYR 25GX5/8") 25G X 5/8" 3 ML MISC Use as directed weekly with B12. 10 each 0   VUMERITY 231 MG CPDR Take 2 capsules by mouth 2 (two) times daily.     No current facility-administered medications on file prior to visit.    BP 110/70 (BP Location: Left Arm, Patient Position: Sitting, Cuff Size: Normal)   Pulse 98    Temp 98.6 F (37 C) (Oral)   Resp 18   Ht 5' 9.5" (1.765 m)   Wt 157 lb 12.8 oz (71.6 kg)   SpO2 98%   BMI 22.97 kg/m        Objective:   Physical Exam  General Mental Status- Alert. General Appearance- Not in acute distress.   Skin General: Color- Normal Color. Moisture- Normal Moisture.  Neck Carotid Arteries- Normal color. Moisture- Normal Moisture. No carotid bruits. No JVD.  Chest and Lung Exam Auscultation: Breath Sounds:-Normal.  Cardiovascular Auscultation:Rythm- Regular. Murmurs & Other Heart Sounds:Auscultation of the heart reveals- No Murmurs.  Abdomen Inspection:-Inspeection Normal. Palpation/Percussion:Note:No mass. Palpation and Percussion of the abdomen reveal- Non  Tender, Non Distended + BS, no rebound or guarding.   Neurologic Cranial Nerve exam:- CN III-XII intact(No nystagmus), symmetric smile. Strength:- 5/5 equal and symmetric strength both upper and lower extremities.   Back- no cva tenderness.      Assessment & Plan:   Patient Instructions  Your appear to have a urinary tract infection. Will give rocephin 1 gram IM today and 7 days of keflex antibiotic for the infection.(Since some resistance on prior culture and recent infection.) Hydrate well. I am sending out a urine culture. During the interim if your signs and symptoms worsen rather than improving please notify us. We will notify your when the culture results are back.   Diflucan rx in event you get yeast infection.  Follow up in 7 days or as needed.

## 2022-03-21 NOTE — Addendum Note (Signed)
Addended by: Anabel Halon on: 03/21/2022 12:52 PM   Modules accepted: Orders

## 2022-03-21 NOTE — Patient Instructions (Signed)
Your appear to have a urinary tract infection. Will give rocephin 1 gram IM today and 7 days of keflex antibiotic for the infection.(Since some resistance on prior culture and recent infection.) Hydrate well. I am sending out a urine culture. During the interim if your signs and symptoms worsen rather than improving please notify us. We will notify your when the culture results are back.   Diflucan rx in event you get yeast infection.  Follow up in 7 days or as needed.

## 2022-03-21 NOTE — Addendum Note (Signed)
Addended by: Sanda Linger on: 03/21/2022 12:56 PM   Modules accepted: Orders

## 2022-03-22 LAB — URINE CULTURE
MICRO NUMBER:: 13697130
Result:: NO GROWTH
SPECIMEN QUALITY:: ADEQUATE

## 2022-03-25 ENCOUNTER — Encounter: Payer: Self-pay | Admitting: Medical

## 2022-04-01 ENCOUNTER — Encounter: Payer: Self-pay | Admitting: Medical

## 2022-04-02 NOTE — Addendum Note (Signed)
Addended by: Anabel Halon on: 04/02/2022 05:00 PM   Modules accepted: Orders

## 2022-04-09 ENCOUNTER — Encounter: Payer: Self-pay | Admitting: Internal Medicine

## 2022-04-24 ENCOUNTER — Other Ambulatory Visit (HOSPITAL_BASED_OUTPATIENT_CLINIC_OR_DEPARTMENT_OTHER): Payer: Self-pay

## 2022-04-24 ENCOUNTER — Ambulatory Visit (INDEPENDENT_AMBULATORY_CARE_PROVIDER_SITE_OTHER): Payer: 59 | Admitting: Neurology

## 2022-04-24 ENCOUNTER — Encounter: Payer: Self-pay | Admitting: Neurology

## 2022-04-24 VITALS — BP 124/74 | HR 70 | Ht 69.5 in | Wt 152.5 lb

## 2022-04-24 DIAGNOSIS — R5383 Other fatigue: Secondary | ICD-10-CM | POA: Diagnosis not present

## 2022-04-24 DIAGNOSIS — F988 Other specified behavioral and emotional disorders with onset usually occurring in childhood and adolescence: Secondary | ICD-10-CM

## 2022-04-24 DIAGNOSIS — N399 Disorder of urinary system, unspecified: Secondary | ICD-10-CM

## 2022-04-24 DIAGNOSIS — R200 Anesthesia of skin: Secondary | ICD-10-CM | POA: Diagnosis not present

## 2022-04-24 DIAGNOSIS — Z79899 Other long term (current) drug therapy: Secondary | ICD-10-CM

## 2022-04-24 DIAGNOSIS — G35 Multiple sclerosis: Secondary | ICD-10-CM

## 2022-04-24 MED ORDER — AMPHETAMINE-DEXTROAMPHETAMINE 10 MG PO TABS
ORAL_TABLET | ORAL | 0 refills | Status: DC
  Filled 2022-04-24: qty 60, 30d supply, fill #0

## 2022-04-24 NOTE — Progress Notes (Signed)
[facility]  PATIENT: Karen Powers DOB: 1980-10-25  REFERRING DOCTOR OR PCP: Mackie Pai, PA-C SOURCE: Patient, notes from neurology, imaging and laboratory reports, MRI images personally reviewed.  _________________________________   HISTORICAL  CHIEF COMPLAINT:  Chief Complaint  Patient presents with   Follow-up    RM 1, alone. Last seen 11/07/21. MS DMT: Vumerity. No issues, doing well.     HISTORY OF PRESENT ILLNESS:  Karen Powers is a 41 y.o. woman with multiple sclerosis and treatment options.  Update 04/24/2022 She is doing well.  She started Vumerity and tolerates it well.   No exacerbations.  Gait is doing well.   She does not need to use the bannister on stairs but might on some stairs.   No weakness or numbness.   She has no hesitancy but does not feel she completely empties.  She has had 2 UTI in last 2 months.     Vision is ok though she has trouble focusing on words at time.    Her OD vision is generally worse than OS.  Eye exams have not shown evidence of MS in the past.      Fatigue and some mild mental fog.  She has noted issues with cognition, specifically in conversation coming up with a word or having trouble remembering what she was to say or do.   She has difficulty with focus/attention.  She works nights on weekends and sleep is variable.  She is a Marine scientist.   She denies significant depression but occasionally feels down.       MS Hstory She is a 41 year old woman who was diagnosed with MS in 2009 at age 7.   In September 2009, she was experiencing numbness in her right leg,   Her mother had MS prompting an evaluation with an MRI showing several periventricular T2 hyperintense foci and a focus in the left cerebral peduncle.    CSF was analyzed and was also consistent with MS,   She was diagnosed by Dr. Jacolyn Reedy and then started to see Dr. Jacqulynn Cadet in [place]   She was started on Copaxone but only stayed on a couple years.   She was starting  a family and had several more prognosis over the next 10 years..  She was seen Dr. Shelia Media at [facility].  In 2018, she had some visual changes and initiation of a disease modifying therapy was recommended though she did not start due to insurance issues., She had additional neurologic symptoms January or February 2019.   She noted that when she would turn her head to the right, she would get a wet sensation in her right leg.    She then had an episode of severe vertigo a week or two later that persisted a couple weeks.  MRI 10/22/2017 showed several enhancing lesions in the cervical spine and no new lesions in the brain.  She received a few days of IV Solumedrol and was better 1-2 weeks later and symptoms resolved a week or two after that.     She was planning an additional pregnancy so held off a treatment   In December 2022, she felt more off balanced but MRI brain and spine showed no new lesions.   She has since improved and is back at baseline  She has 4 children born 2006, 2015, 2017, and 2020.   She is not planning additional children  Imaging:  MRI of the brain 08/04/2021 showed scattered T2/FLAIR hyperintense foci predominantly in the  periventricular white matter.  None of the foci enhance or appear to be acute.  No new lesions compared to 10/22/2017  MRI of the cervical and thoracic spine 08/04/2021 showed subtle T2 hyperintense foci in the upper cervical spine, with improved appearance compared to the 2019 MRI.  No enhancing lesions.  No additional lesions.  The MRI of the thoracic spine did not show any MS lesions.  MRI of the cervical spine 10/22/2017 showed several enhancing lesions in the cervical spine, to the left adjacent to C2, anteriorly adjacent to C2 and to the right adjacent to C2-C3.  An additional nonenhancing lesion is noted to the left adjacent to C4.  None of these foci were present on the MRI from 07/13/2017 (normal spinal cord)  MRI of the brain 10/22/2017 showed scattered T2/FLAIR  hyperintense foci in the hemispheres, predominantly in the periventricular white matter.  None of the foci enhance.  No definite new lesions compared to 07/13/2017.  MRI of the brain 07/13/2017 showed scattered T2/FLAIR hyperintense foci.  Dominantly in the periventricular white matter with a subtle focus in the left cerebral peduncle.  There has been mild progression compared to the 10/20/2010 MRI.  MRI of the brain 10/20/2010 and 05/17/2008 showed T2/FLAIR hyperintense foci predominantly in the periventricular white matter.  These 2 studies were stable.  REVIEW OF SYSTEMS: Constitutional: No fevers, chills, sweats, or change in appetite Eyes: No visual changes, double vision, eye pain Ear, nose and throat: No hearing loss, ear pain, nasal congestion, sore throat Cardiovascular: No chest pain, palpitations Respiratory:  No shortness of breath at rest or with exertion.   No wheezes GastrointestinaI: No nausea, vomiting, diarrhea, abdominal pain, fecal incontinence Genitourinary:  No dysuria, urinary retention or frequency.  No nocturia. Musculoskeletal:  No neck pain, back pain Integumentary: No rash, pruritus, skin lesions Neurological: as above Psychiatric: No depression at this time.  No anxiety Endocrine: No palpitations, diaphoresis, change in appetite, change in weigh or increased thirst Hematologic/Lymphatic:  No anemia, purpura, petechiae. Allergic/Immunologic: No itchy/runny eyes, nasal congestion, recent allergic reactions, rashes  ALLERGIES: Allergies  Allergen Reactions   Ciprofloxacin Rash and Other (See Comments)    Has patient had a PCN reaction causing immediate rash, facial/tongue/throat swelling, SOB or lightheadedness with hypotension: No Has patient had a PCN reaction causing severe rash involving mucus membranes or skin necrosis: No Has patient had a PCN reaction that required hospitalization No Has patient had a PCN reaction occurring within the last 10 years: Yes If  all of the above answers are "NO", then may proceed with Cephalosporin use.    HOME MEDICATIONS:  Current Outpatient Medications:    acetaminophen (TYLENOL) 500 MG tablet, Take 1,000 mg by mouth every 6 (six) hours as needed for moderate pain or headache., Disp: , Rfl:    amphetamine-dextroamphetamine (ADDERALL) 10 MG tablet, Take 1 tablet by mouth every morning then take 1 tablet by mouth 4 hours later, Disp: 60 tablet, Rfl: 0   ibuprofen (ADVIL) 200 MG tablet, Take 800 mg by mouth every 6 (six) hours as needed for headache or mild pain., Disp: , Rfl:    LORazepam (ATIVAN) 0.5 MG tablet, Take 1 tablet (0.5 mg total) by mouth 2 (two) times daily as needed for anxiety., Disp: 8 tablet, Rfl: 0   ondansetron (ZOFRAN) 4 MG tablet, Take 1 tablet (4 mg total) by mouth every 8 (eight) hours as needed for nausea or vomiting., Disp: 20 tablet, Rfl: 0   VUMERITY 231 MG CPDR, Take 2  capsules by mouth 2 (two) times daily., Disp: , Rfl:   PAST MEDICAL HISTORY: Past Medical History:  Diagnosis Date   Acute blood loss anemia 01/06/2016   Anemia    Diabetes mellitus without complication ([place]) 8938   Gestational diabetes only.   Dysrhythmia    Gestational diabetes    2nd preg only   Headache(784.0)    otc med prn   Heartburn in pregnancy    Maternal iron deficiency anemia 01/06/2016   MS (multiple sclerosis) ([place])    Postpartum care following cesarean delivery (5/11) 01/05/2016   Postpartum hypertension 03/07/2019   POTS (postural orthostatic tachycardia syndrome)    SVT (supraventricular tachycardia) ([place])    No problems since 10/2012 - no med needed   Vaginal Pap smear, abnormal    Vitamin deficiency     PAST SURGICAL HISTORY: Past Surgical History:  Procedure Laterality Date   ATRIAL ABLATION SURGERY  2003   FAILED   CESAREAN SECTION  2006   Breech   CESAREAN SECTION  2006   breech   CESAREAN SECTION N/A 12/19/2013   Procedure: REPEAT CESAREAN SECTION;  Surgeon: Lovenia Kim, MD;   Location: [facility];  Service: Obstetrics;  Laterality: N/A;  EDD: 12/25/13   CESAREAN SECTION N/A 01/05/2016   Procedure: Repeat CESAREAN SECTION;  Surgeon: Brien Few, MD;  Location: [facility];  Service: Obstetrics;  Laterality: N/A;  EDD: 01/12/16    CESAREAN SECTION WITH BILATERAL TUBAL LIGATION Bilateral 02/25/2019   Procedure: Repeat CESAREAN SECTION WITH BILATERAL TUBAL LIGATION;  Surgeon: Brien Few, MD;  Location: [facility];  Service: Obstetrics;  Laterality: Bilateral;  Heather, RNFA  EDD: 03/04/19 Allergy: Adhesive, Paxil, Cipro   CHOLECYSTECTOMY N/A 08/13/2019   Procedure: LAPAROSCOPIC CHOLECYSTECTOMY WITH  CHOLANGIOGRAM;  Surgeon: Greer Pickerel, MD;  Location: [facility];  Service: General;  Laterality: N/A;   COLPOSCOPY     WISDOM TOOTH EXTRACTION      FAMILY HISTORY: Family History  Problem Relation Age of Onset   Multiple sclerosis Mother    Migraines Mother    Emphysema Father    Heart disease Father    Migraines Father    Migraines Sister    Spina bifida Brother        half brother   Cancer Paternal Grandfather        LUNG   Diabetes Maternal Grandmother     SOCIAL HISTORY:  Social History   Socioeconomic History   Marital status: Married    Spouse name: Valen Mascaro   Number of children: 4   Years of education: Not on file   Highest education level: Not on file  Occupational History   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  Tobacco Use   Smoking status: Never   Smokeless tobacco: Never  Vaping Use   Vaping Use: Never used  Substance and Sexual Activity   Alcohol use: No   Drug use: No   Sexual activity: Yes    Birth control/protection: Pill    Comment: pregnant  Other Topics Concern   Not on file  Social History Narrative   Soda daily   Right handed    Lives with husband and 4 kids   She is ER nurse       Social Determinants of Health   Financial Resource Strain: Not on file  Food Insecurity: Not on file  Transportation Needs: Not on  file  Physical Activity: Not on file  Stress: Not on file  Social Connections: Not on  file  Intimate Partner Violence: Not on file     PHYSICAL EXAM  Vitals:   04/24/22 1101  BP: 124/74  Pulse: 70  Weight: 152 lb 8 oz (69.2 kg)  Height: 5' 9.5" (1.765 m)    Body mass index is 22.2 kg/m.   General: The patient is well-developed and well-nourished and in no acute distress  HEENT:  Head is [place]/AT.  Sclera are anicteric.    Skin: Extremities are without rash or  edema.  Musculoskeletal:  Back is nontender  Neurologic Exam  Mental status: The patient is alert and oriented x 3 at the time of the examination. The patient has apparent normal recent and remote memory, with an apparently normal attention span and concentration ability.   Speech is normal.  Cranial nerves: Extraocular movements are full.  Facial strength and sensation was normal.  Trapezius and sternocleidomastoid strength is normal. No dysarthria is noted.  . No obvious hearing deficits are noted.  Motor:  Muscle bulk is normal.   Tone is normal. Strength is  5 / 5 in all 4 extremities.   Sensory: Sensory testing is intact to pinprick, soft touch and vibration sensation in all 4 extremities.  Coordination: Cerebellar testing reveals good finger-nose-finger and heel-to-shin bilaterally.  Gait and station: Station is normal.   Gait is normal. Tandem gait is normal. Romberg is negative.   Reflexes: Deep tendon reflexes are symmetric and normal bilaterally.       DIAGNOSTIC DATA (LABS, IMAGING, TESTING) - I reviewed patient records, labs, notes, testing and imaging myself where available.  Lab Results  Component Value Date   WBC 4.9 09/19/2021   HGB 11.9 (L) 09/19/2021   HCT 34.9 (L) 09/19/2021   MCV 90.7 09/19/2021   PLT 212.0 09/19/2021      Component Value Date/Time   NA 139 09/19/2021 1040   K 4.2 09/19/2021 1040   CL 106 09/19/2021 1040   CO2 28 09/19/2021 1040   GLUCOSE 92 09/19/2021 1040    BUN 6 09/19/2021 1040   CREATININE 0.65 09/19/2021 1040   CREATININE 0.78 03/30/2020 0838   CALCIUM 8.7 09/19/2021 1040   PROT 6.4 09/19/2021 1040   ALBUMIN 4.3 09/19/2021 1040   AST 14 09/19/2021 1040   ALT 12 09/19/2021 1040   ALKPHOS 56 09/19/2021 1040   BILITOT 0.8 09/19/2021 1040   GFRNONAA >60 08/03/2021 2215   GFRAA >60 08/13/2019 0420   Lab Results  Component Value Date   CHOL 158 09/19/2021   HDL 52.90 09/19/2021   LDLCALC 97 09/19/2021   TRIG 41.0 09/19/2021   CHOLHDL 3 09/19/2021   No results found for: "HGBA1C" Lab Results  Component Value Date   VITAMINB12 183 (L) 09/19/2021   Lab Results  Component Value Date   TSH 0.97 09/19/2021       ASSESSMENT AND PLAN  Multiple sclerosis ([place]) - Plan: CBC with Differential/Platelet, Comprehensive metabolic panel  Attention deficit disorder (ADD) in adult  Other fatigue  Numbness  Urinary disorder  High risk medication use - Plan: CBC with Differential/Platelet, Comprehensive metabolic panel   Continue Vumerity.   Check labs.  Around time of next visit, will check MRI brain Adderall 10 mg po bid for MS related ADD Rtc 6 months or sooner if new or worsening symptoms.   Johnita Palleschi A. Felecia Shelling, MD, [facility] 2/59/5638, 7:56 PM Certified in Neurology, Clinical Neurophysiology, Sleep Medicine and Neuroimaging  [facility] [address], [place], [place] [address] [phone]

## 2022-04-25 LAB — CBC WITH DIFFERENTIAL/PLATELET
Basophils Absolute: 0 10*3/uL (ref 0.0–0.2)
Basos: 0 %
EOS (ABSOLUTE): 0.1 10*3/uL (ref 0.0–0.4)
Eos: 2 %
Hematocrit: 35.6 % (ref 34.0–46.6)
Hemoglobin: 12.1 g/dL (ref 11.1–15.9)
Immature Grans (Abs): 0 10*3/uL (ref 0.0–0.1)
Immature Granulocytes: 0 %
Lymphocytes Absolute: 1.1 10*3/uL (ref 0.7–3.1)
Lymphs: 26 %
MCH: 30.3 pg (ref 26.6–33.0)
MCHC: 34 g/dL (ref 31.5–35.7)
MCV: 89 fL (ref 79–97)
Monocytes Absolute: 0.4 10*3/uL (ref 0.1–0.9)
Monocytes: 8 %
Neutrophils Absolute: 3 10*3/uL (ref 1.4–7.0)
Neutrophils: 64 %
Platelets: 116 10*3/uL — ABNORMAL LOW (ref 150–450)
RBC: 3.99 x10E6/uL (ref 3.77–5.28)
RDW: 13.1 % (ref 11.7–15.4)
WBC: 4.6 10*3/uL (ref 3.4–10.8)

## 2022-04-25 LAB — COMPREHENSIVE METABOLIC PANEL
ALT: 12 IU/L (ref 0–32)
AST: 14 IU/L (ref 0–40)
Albumin/Globulin Ratio: 2 (ref 1.2–2.2)
Albumin: 4.6 g/dL (ref 3.9–4.9)
Alkaline Phosphatase: 44 IU/L (ref 44–121)
BUN/Creatinine Ratio: 11 (ref 9–23)
BUN: 8 mg/dL (ref 6–24)
Bilirubin Total: 1.3 mg/dL — ABNORMAL HIGH (ref 0.0–1.2)
CO2: 22 mmol/L (ref 20–29)
Calcium: 8.9 mg/dL (ref 8.7–10.2)
Chloride: 107 mmol/L — ABNORMAL HIGH (ref 96–106)
Creatinine, Ser: 0.7 mg/dL (ref 0.57–1.00)
Globulin, Total: 2.3 g/dL (ref 1.5–4.5)
Glucose: 95 mg/dL (ref 70–99)
Potassium: 5.1 mmol/L (ref 3.5–5.2)
Sodium: 141 mmol/L (ref 134–144)
Total Protein: 6.9 g/dL (ref 6.0–8.5)
eGFR: 112 mL/min/{1.73_m2} (ref >59)

## 2022-04-26 ENCOUNTER — Encounter: Payer: Self-pay | Admitting: Medical

## 2022-04-26 NOTE — Addendum Note (Signed)
Addended by: Anabel Halon on: 04/26/2022 04:44 PM   Modules accepted: Orders

## 2022-05-09 ENCOUNTER — Other Ambulatory Visit (INDEPENDENT_AMBULATORY_CARE_PROVIDER_SITE_OTHER): Payer: 59

## 2022-05-09 ENCOUNTER — Encounter: Payer: Self-pay | Admitting: Internal Medicine

## 2022-05-09 ENCOUNTER — Ambulatory Visit (INDEPENDENT_AMBULATORY_CARE_PROVIDER_SITE_OTHER): Payer: 59 | Admitting: Internal Medicine

## 2022-05-09 ENCOUNTER — Other Ambulatory Visit (HOSPITAL_BASED_OUTPATIENT_CLINIC_OR_DEPARTMENT_OTHER): Payer: Self-pay

## 2022-05-09 VITALS — BP 112/78 | HR 85 | Ht 69.0 in | Wt 152.0 lb

## 2022-05-09 DIAGNOSIS — R197 Diarrhea, unspecified: Secondary | ICD-10-CM

## 2022-05-09 DIAGNOSIS — R1310 Dysphagia, unspecified: Secondary | ICD-10-CM | POA: Diagnosis not present

## 2022-05-09 DIAGNOSIS — R159 Full incontinence of feces: Secondary | ICD-10-CM | POA: Diagnosis not present

## 2022-05-09 DIAGNOSIS — R1013 Epigastric pain: Secondary | ICD-10-CM | POA: Diagnosis not present

## 2022-05-09 DIAGNOSIS — R17 Unspecified jaundice: Secondary | ICD-10-CM

## 2022-05-09 DIAGNOSIS — D696 Thrombocytopenia, unspecified: Secondary | ICD-10-CM

## 2022-05-09 DIAGNOSIS — Z800 Family history of malignant neoplasm of digestive organs: Secondary | ICD-10-CM

## 2022-05-09 LAB — CBC WITH DIFFERENTIAL/PLATELET
Basophils Absolute: 0 10*3/uL (ref 0.0–0.1)
Basophils Relative: 0.3 % (ref 0.0–3.0)
Eosinophils Absolute: 0.1 10*3/uL (ref 0.0–0.7)
Eosinophils Relative: 1.8 % (ref 0.0–5.0)
HCT: 35.6 % — ABNORMAL LOW (ref 36.0–46.0)
Hemoglobin: 12.2 g/dL (ref 12.0–15.0)
Lymphocytes Relative: 33.1 % (ref 12.0–46.0)
Lymphs Abs: 1.3 10*3/uL (ref 0.7–4.0)
MCHC: 34.3 g/dL (ref 30.0–36.0)
MCV: 88.1 fl (ref 78.0–100.0)
Monocytes Absolute: 0.4 10*3/uL (ref 0.1–1.0)
Monocytes Relative: 8.9 % (ref 3.0–12.0)
Neutro Abs: 2.2 10*3/uL (ref 1.4–7.7)
Neutrophils Relative %: 55.9 % (ref 43.0–77.0)
Platelets: 196 10*3/uL (ref 150.0–400.0)
RBC: 4.05 Mil/uL (ref 3.87–5.11)
RDW: 14.2 % (ref 11.5–15.5)
WBC: 4 10*3/uL (ref 4.0–10.5)

## 2022-05-09 LAB — BILIRUBIN, DIRECT: Bilirubin, Direct: 0.2 mg/dL (ref 0.0–0.3)

## 2022-05-09 LAB — HEPATIC FUNCTION PANEL
ALT: 14 U/L (ref 0–35)
AST: 15 U/L (ref 0–37)
Albumin: 4.4 g/dL (ref 3.5–5.2)
Alkaline Phosphatase: 42 U/L (ref 39–117)
Bilirubin, Direct: 0.2 mg/dL (ref 0.0–0.3)
Total Bilirubin: 1.1 mg/dL (ref 0.2–1.2)
Total Protein: 7 g/dL (ref 6.0–8.3)

## 2022-05-09 LAB — LIPASE: Lipase: 19 U/L (ref 11.0–59.0)

## 2022-05-09 LAB — C-REACTIVE PROTEIN: CRP: <1 mg/dL (ref 0.5–20.0)

## 2022-05-09 MED ORDER — COLESTIPOL HCL 1 G PO TABS
2.0000 g | ORAL_TABLET | Freq: Two times a day (BID) | ORAL | 2 refills | Status: DC
  Filled 2022-05-09 – 2022-05-28 (×2): qty 120, 30d supply, fill #0

## 2022-05-09 NOTE — Patient Instructions (Signed)
Your provider has requested that you go to the basement level for lab work before leaving today. Press "B" on the elevator. The lab is located at the first door on the left as you exit the elevator.  Due to recent changes in healthcare laws, you may see the results of your imaging and laboratory studies on MyChart before your provider has had a chance to review them.  We understand that in some cases there may be results that are confusing or concerning to you. Not all laboratory results come back in the same time frame and the provider may be waiting for multiple results in order to interpret others.  Please give [place] 48 hours in order for your provider to thoroughly review all the results before contacting the office for clarification of your results.   Start benefiber or metamucil daily.  We have sent the following medications to your pharmacy for you to pick up at your convenience: Colestipol  We are giving you information to read and follow on FODMAP.   I appreciate the opportunity to care for you. Georgian Co, MD

## 2022-05-09 NOTE — Progress Notes (Signed)
Chief Complaint: Dysphagia  HPI : 41 year old female with history of multiple sclerosis, IBS, and headache presents with dysphagia  She had had dysphagia for the last 6-8 months. This dysphagia occurs to solids and liquids. The dysphagia has been worsening over time. Endorses acid reflux in the past intermittently. She will take Nexium on occasion to help with reflux symptoms. Denies pain with swallowing. Denies N&V, ab pain, chest burning, regurgitation, or blood in the stools. Endorses ab pain occasionally. She has had her gallbladder out but then her ab pain will feel like her gallbladder pain. The pain is locate in the epigastric pain. She has had longstanding diarrhea for years and fecal incontinence over the last year. She will avoid eating if she has to go somewhere due to concerns about potential incontinence. She is not using a fiber supplement at this time. She is on average having 0-4 BMs per day. Her BMs are always loose. Her great-grandmother died of colon cancer. Her sister had colon polyps removed recently. Her mother and grandmother have similar bowel issues. Her brother has celiac disease. She has had two episodes of right-sided diverticulitis in the past. She has had 4 C-sections in the past. 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  Past Medical History:  Diagnosis Date   Acute blood loss anemia 01/06/2016   Anemia    Diabetes mellitus without complication ([place]) 2122   Gestational diabetes only.   Dysrhythmia    Gallstones    Gestational diabetes    2nd preg only   Headache(784.0)    otc med prn   Heartburn in pregnancy    IBS (irritable bowel syndrome)    Maternal iron deficiency anemia 01/06/2016   MS (multiple sclerosis) ([place])    Postpartum care following cesarean delivery (5/11) 01/05/2016   Postpartum hypertension 03/07/2019   POTS (postural orthostatic tachycardia syndrome)    SVT (supraventricular tachycardia) ([place])    No problems since 10/2012 - no med  needed   Vaginal Pap smear, abnormal    Vitamin deficiency      Past Surgical History:  Procedure Laterality Date   ATRIAL ABLATION SURGERY  2003   FAILED   CESAREAN SECTION  2006   Breech   CESAREAN SECTION  2006   breech   CESAREAN SECTION N/A 12/19/2013   Procedure: REPEAT CESAREAN SECTION;  Surgeon: Lovenia Kim, MD;  Location: [facility];  Service: Obstetrics;  Laterality: N/A;  EDD: 12/25/13   CESAREAN SECTION N/A 01/05/2016   Procedure: Repeat CESAREAN SECTION;  Surgeon: Brien Few, MD;  Location: [facility];  Service: Obstetrics;  Laterality: N/A;  EDD: 01/12/16    CESAREAN SECTION WITH BILATERAL TUBAL LIGATION Bilateral 02/25/2019   Procedure: Repeat CESAREAN SECTION WITH BILATERAL TUBAL LIGATION;  Surgeon: Brien Few, MD;  Location: [facility];  Service: Obstetrics;  Laterality: Bilateral;  Heather, RNFA  EDD: 03/04/19 Allergy: Adhesive, Paxil, Cipro   CHOLECYSTECTOMY N/A 08/13/2019   Procedure: LAPAROSCOPIC CHOLECYSTECTOMY WITH  CHOLANGIOGRAM;  Surgeon: Greer Pickerel, MD;  [facility];  Service: General;  Laterality: N/A;   COLPOSCOPY     WISDOM TOOTH EXTRACTION     Family History  Problem Relation Age of Onset   Multiple sclerosis Mother    Migraines Mother    Emphysema Father    Heart disease Father    Migraines Father    Bladder Cancer Father    Lung cancer Father    Migraines Sister    Spina bifida  Brother        half brother   Celiac disease Brother    Diabetes Maternal Grandmother    Colon cancer Maternal Grandmother    Cancer Paternal Grandfather        LUNG   Esophageal cancer Neg Hx    Stomach cancer Neg Hx    Social History   Tobacco Use   Smoking status: Never   Smokeless tobacco: Never  Vaping Use   Vaping Use: Never used  Substance Use Topics   Alcohol use: No   Drug use: No   Current Outpatient Medications  Medication Sig Dispense Refill   acetaminophen (TYLENOL) 500 MG tablet Take 1,000 mg by mouth every 6 (six) hours  as needed for moderate pain or headache.     amphetamine-dextroamphetamine (ADDERALL) 10 MG tablet Take 1 tablet by mouth every morning then take 1 tablet by mouth 4 hours later 60 tablet 0   ibuprofen (ADVIL) 200 MG tablet Take 800 mg by mouth every 6 (six) hours as needed for headache or mild pain.     LORazepam (ATIVAN) 0.5 MG tablet Take 1 tablet (0.5 mg total) by mouth 2 (two) times daily as needed for anxiety. 8 tablet 0   ondansetron (ZOFRAN) 4 MG tablet Take 1 tablet (4 mg total) by mouth every 8 (eight) hours as needed for nausea or vomiting. 20 tablet 0   VUMERITY 231 MG CPDR Take 2 capsules by mouth 2 (two) times daily.     No current facility-administered medications for this visit.   Allergies  Allergen Reactions   Ciprofloxacin Rash and Other (See Comments)    Has patient had a PCN reaction causing immediate rash, facial/tongue/throat swelling, SOB or lightheadedness with hypotension: No Has patient had a PCN reaction causing severe rash involving mucus membranes or skin necrosis: No Has patient had a PCN reaction that required hospitalization No Has patient had a PCN reaction occurring within the last 10 years: Yes If all of the above answers are "NO", then may proceed with Cephalosporin use.   Review of Systems: All systems reviewed and negative except where noted in HPI.   Physical Exam: BP 112/78   Pulse 85   Ht '5\' 9"'$  (1.753 m)   Wt 152 lb (68.9 kg)   BMI 22.45 kg/m  Constitutional: Pleasant,well-developed, female in no acute distress. HEENT: Normocephalic and atraumatic. Conjunctivae are normal. No scleral icterus. Cardiovascular: Normal rate, regular rhythm.  Pulmonary/chest: Effort normal and breath sounds normal. No wheezing, rales or rhonchi. Abdominal: Soft, nondistended, tender in the lower abdomen. Bowel sounds active throughout. There are no masses palpable. No hepatomegaly. Extremities: No edema Neurological: Alert and oriented to person place and  time. Skin: Skin is warm and dry. No rashes noted. Psychiatric: Normal mood and affect. Behavior is normal.  Labs 03/2022: CMP with mildly elevated total bilirubin of 1.3. CBC with mildly low platelets  RUQ U/S 08/07/19: IMPRESSION: There is cholelithiasis without secondary signs of acute cholecystitis.  Flex sig 12/03/96: Normal left colon to 60 cm, s/p biopsies. Path: Normal colonic mucosa  ASSESSMENT AND PLAN: Dysphagia Epigastric ab pain Diarrhea Fecal incontinence History of cholecystectomy Family history of colon cancer Elevated bilirubin Thrombocytopenia Patient presents with solid and liquid dysphagia for several months and fecal incontinence over the last year. Also endorses diarrhea and intermittent epigastric ab pain. Patient's diarrhea and fecal incontinence could be related to IBS so will start her on a daily fiber supplement and a low FODMAP diet. Would also consider  bile acid diarrhea as a potential contributor due to her history of cholecystectomy so will start her on a trial of colestipol therapy. Will plan for a EGD for further evaluation of her dysphagia and epigastric ab pain and a colonoscopy to look for sources of diarrhea and for colon cancer screening. Will also perform basic lab work up as well to look for alternative sources of her symptoms. - Low FODMAP - Check CBC, LFTs, direct bilirubin, INR, lipase, CRP, TTG IgA, IgA - Start daily fiber supplement - Start colestipol 2 mg BID - EGD/colonoscopy [facility]  Christia Reading, MD

## 2022-05-09 NOTE — Addendum Note (Signed)
Addended by: Anabel Halon on: 05/09/2022 04:55 PM   Modules accepted: Orders

## 2022-05-10 ENCOUNTER — Other Ambulatory Visit (HOSPITAL_BASED_OUTPATIENT_CLINIC_OR_DEPARTMENT_OTHER): Payer: Self-pay

## 2022-05-10 LAB — TISSUE TRANSGLUTAMINASE, IGA: (tTG) Ab, IgA: <1 U/mL

## 2022-05-10 LAB — IGA: Immunoglobulin A: 135 mg/dL (ref 47–310)

## 2022-05-22 ENCOUNTER — Other Ambulatory Visit (HOSPITAL_BASED_OUTPATIENT_CLINIC_OR_DEPARTMENT_OTHER): Payer: Self-pay

## 2022-05-28 ENCOUNTER — Other Ambulatory Visit (HOSPITAL_BASED_OUTPATIENT_CLINIC_OR_DEPARTMENT_OTHER): Payer: Self-pay

## 2022-06-15 ENCOUNTER — Other Ambulatory Visit (HOSPITAL_BASED_OUTPATIENT_CLINIC_OR_DEPARTMENT_OTHER): Payer: Self-pay

## 2022-06-15 MED ORDER — FLUARIX QUADRIVALENT 0.5 ML IM SUSY
PREFILLED_SYRINGE | INTRAMUSCULAR | 0 refills | Status: DC
  Filled 2022-06-15: qty 0.5, 1d supply, fill #0

## 2022-06-20 ENCOUNTER — Ambulatory Visit (AMBULATORY_SURGERY_CENTER): Payer: 59 | Admitting: Internal Medicine

## 2022-06-20 ENCOUNTER — Other Ambulatory Visit (HOSPITAL_BASED_OUTPATIENT_CLINIC_OR_DEPARTMENT_OTHER): Payer: Self-pay

## 2022-06-20 ENCOUNTER — Encounter: Payer: Self-pay | Admitting: Internal Medicine

## 2022-06-20 VITALS — BP 129/86 | HR 72 | Temp 97.8°F | Resp 10 | Ht 69.0 in | Wt 152.0 lb

## 2022-06-20 DIAGNOSIS — K5730 Diverticulosis of large intestine without perforation or abscess without bleeding: Secondary | ICD-10-CM | POA: Diagnosis not present

## 2022-06-20 DIAGNOSIS — K319 Disease of stomach and duodenum, unspecified: Secondary | ICD-10-CM | POA: Diagnosis not present

## 2022-06-20 DIAGNOSIS — R1310 Dysphagia, unspecified: Secondary | ICD-10-CM | POA: Diagnosis not present

## 2022-06-20 DIAGNOSIS — R197 Diarrhea, unspecified: Secondary | ICD-10-CM | POA: Diagnosis not present

## 2022-06-20 DIAGNOSIS — K2970 Gastritis, unspecified, without bleeding: Secondary | ICD-10-CM

## 2022-06-20 DIAGNOSIS — K649 Unspecified hemorrhoids: Secondary | ICD-10-CM

## 2022-06-20 DIAGNOSIS — D123 Benign neoplasm of transverse colon: Secondary | ICD-10-CM

## 2022-06-20 DIAGNOSIS — K2289 Other specified disease of esophagus: Secondary | ICD-10-CM | POA: Diagnosis not present

## 2022-06-20 DIAGNOSIS — K2100 Gastro-esophageal reflux disease with esophagitis, without bleeding: Secondary | ICD-10-CM

## 2022-06-20 DIAGNOSIS — D120 Benign neoplasm of cecum: Secondary | ICD-10-CM

## 2022-06-20 DIAGNOSIS — K635 Polyp of colon: Secondary | ICD-10-CM | POA: Diagnosis not present

## 2022-06-20 MED ORDER — OMEPRAZOLE 40 MG PO CPDR
40.0000 mg | DELAYED_RELEASE_CAPSULE | Freq: Two times a day (BID) | ORAL | 0 refills | Status: DC
  Filled 2022-06-20: qty 112, 56d supply, fill #0

## 2022-06-20 MED ORDER — SODIUM CHLORIDE 0.9 % IV SOLN: 500.0000 mL | Freq: Once | INTRAVENOUS | Status: DC

## 2022-06-20 NOTE — Op Note (Signed)
[facility] Patient Name: Karen Powers Procedure Date: 06/20/2022 1:26 PM MRN: [id number] Endoscopist: Adline Mango [place] , , [id number] Age: 41 Referring MD:  Date of Birth: 1981-03-04 Gender: Female Account #: [id number] Procedure:                Colonoscopy Indications:              Diarrhea Medicines:                Monitored Anesthesia Care Procedure:                Pre-Anesthesia Assessment:                           - Prior to the procedure, a History and Physical                            was performed, and patient medications and                            allergies were reviewed. The patient's tolerance of                            previous anesthesia was also reviewed. The risks                            and benefits of the procedure and the sedation                            options and risks were discussed with the patient.                            All questions were answered, and informed consent                            was obtained. Prior Anticoagulants: The patient has                            taken no anticoagulant or antiplatelet agents. ASA                            Grade Assessment: II - A patient with mild systemic                            disease. After reviewing the risks and benefits,                            the patient was deemed in satisfactory condition to                            undergo the procedure.                           After obtaining informed consent, the colonoscope  was passed under direct vision. Throughout the                            procedure, the patient's blood pressure, pulse, and                            oxygen saturations were monitored continuously. The                            Olympus CF-HQ190L [id number]) Colonoscope was                            introduced through the anus and advanced to the the                            terminal ileum. The colonoscopy was performed                             without difficulty. The patient tolerated the                            procedure well. The quality of the bowel                            preparation was good. The terminal ileum, ileocecal                            valve, appendiceal orifice, and rectum were                            photographed. Scope In: 1:54:35 PM Scope Out: 2:18:43 PM Scope Withdrawal Time: 0 hours 17 minutes 45 seconds  Total Procedure Duration: 0 hours 24 minutes 8 seconds  Findings:                 The terminal ileum appeared normal.                           Two sessile polyps were found in the transverse                            colon and cecum. The polyps were 3 to 4 mm in size.                            These polyps were removed with a cold snare.                            Resection and retrieval were complete.                           Multiple diverticula were found in the sigmoid                            colon, descending colon and transverse colon.  Non-bleeding external and internal hemorrhoids were                            found during retroflexion.                           Biopsies for histology were taken with a cold                            forceps from the entire colon for evaluation of                            microscopic colitis. Complications:            No immediate complications. Estimated Blood Loss:     Estimated blood loss was minimal. Impression:               - The examined portion of the ileum was normal.                           - Two 3 to 4 mm polyps in the transverse colon and                            in the cecum, removed with a cold snare. Resected                            and retrieved.                           - Diverticulosis in the sigmoid colon, in the                            descending colon and in the transverse colon.                           - Non-bleeding external and internal hemorrhoids.                            - Biopsies were taken with a cold forceps from the                            entire colon for evaluation of microscopic colitis. Recommendation:           - Discharge patient to home (with escort).                           - Await pathology results.                           - Return to GI clinic in 6 weeks.                           - The findings and recommendations were discussed  with the patient. Dr Georgian Co "[name]" Lorenso Courier,  06/20/2022 2:29:17 PM

## 2022-06-20 NOTE — Patient Instructions (Signed)
Handouts on the findings given to you today  Await pathology results Return to GI clinic in 6 weeks. Appointment to be scheduled.   YOU HAD AN ENDOSCOPIC PROCEDURE TODAY AT [facility]:   Refer to the procedure report that was given to you for any specific questions about what was found during the examination.  If the procedure report does not answer your questions, please call your gastroenterologist to clarify.  If you requested that your care partner not be given the details of your procedure findings, then the procedure report has been included in a sealed envelope for you to review at your convenience later.  YOU SHOULD EXPECT: Some feelings of bloating in the abdomen. Passage of more gas than usual.  Walking can help get rid of the air that was put into your GI tract during the procedure and reduce the bloating. If you had a lower endoscopy (such as a colonoscopy or flexible sigmoidoscopy) you may notice spotting of blood in your stool or on the toilet paper. If you underwent a bowel prep for your procedure, you may not have a normal bowel movement for a few days.  Please Note:  You might notice some irritation and congestion in your nose or some drainage.  This is from the oxygen used during your procedure.  There is no need for concern and it should clear up in a day or so.  SYMPTOMS TO REPORT IMMEDIATELY:  Following lower endoscopy (colonoscopy or flexible sigmoidoscopy):  Excessive amounts of blood in the stool  Significant tenderness or worsening of abdominal pains  Swelling of the abdomen that is new, acute  Fever of 100F or higher  Following upper endoscopy (EGD)  Vomiting of blood or coffee ground material  New chest pain or pain under the shoulder blades  Painful or persistently difficult swallowing  New shortness of breath  Fever of 100F or higher  Black, tarry-looking stools  For urgent or emergent issues, a gastroenterologist can be reached at any hour  by calling [phone]. Do not use MyChart messaging for urgent concerns.    DIET:  We do recommend a small meal at first, but then you may proceed to your regular diet.  Drink plenty of fluids but you should avoid alcoholic beverages for 24 hours.  ACTIVITY:  You should plan to take it easy for the rest of today and you should NOT DRIVE or use heavy machinery until tomorrow (because of the sedation medicines used during the test).    FOLLOW UP: Our staff will call the number listed on your records the next business day following your procedure.  We will call around 7:15- 8:00 am to check on you and address any questions or concerns that you may have regarding the information given to you following your procedure. If we do not reach you, we will leave a message.     If any biopsies were taken you will be contacted by phone or by letter within the next 1-3 weeks.  Please call us at [phone] if you have not heard about the biopsies in 3 weeks.   Return to GI clinic in 6 weeks at appointment to be scheduled   SIGNATURES/CONFIDENTIALITY: You and/or your care partner have signed paperwork which will be entered into your electronic medical record.  These signatures attest to the fact that that the information above on your After Visit Summary has been reviewed and is understood.  Full responsibility of the confidentiality of this discharge  information lies with you and/or your care-partner.

## 2022-06-20 NOTE — Progress Notes (Signed)
Vitals-DT  Pt's states no medical or surgical changes since previsit or office visit.  

## 2022-06-20 NOTE — Progress Notes (Signed)
Called to room to assist during endoscopic procedure.  Patient ID and intended procedure confirmed with present staff. Received instructions for my participation in the procedure from the performing physician.  

## 2022-06-20 NOTE — Progress Notes (Signed)
GASTROENTEROLOGY PROCEDURE H&P NOTE   Primary Care Physician: Mackie Pai, PA-C    Reason for Procedure:   Dysphagia, epigastric ab pain, diarrhea, fecal incontinence, family history of colon cancer  Plan:    EGD/colonoscopy  Patient is appropriate for endoscopic procedure(s) in the ambulatory ([facility]) setting.  The nature of the procedure, as well as the risks, benefits, and alternatives were carefully and thoroughly reviewed with the patient. Ample time for discussion and questions allowed. The patient understood, was satisfied, and agreed to proceed.     HPI: Karen Powers is a 41 y.o. female who presents for EGD/colonoscopy for evaluation of dysphagia, epigastric ab pain, diarrhea, fecal incontinence, family history of colon cancer. .  Patient was most recently seen in the Gastroenterology Clinic on 05/09/22.  No interval change in medical history since that appointment. Please refer to that note for full details regarding GI history and clinical presentation.   Past Medical History:  Diagnosis Date   Acute blood loss anemia 01/06/2016   Anemia    Diabetes mellitus without complication ([facility]) 7782   Gestational diabetes only.   Dysrhythmia    Gallstones    Gestational diabetes    2nd preg only   Headache(784.0)    otc med prn   Heartburn in pregnancy    IBS (irritable bowel syndrome)    Maternal iron deficiency anemia 01/06/2016   MS (multiple sclerosis) ([facility])    Postpartum care following cesarean delivery (5/11) 01/05/2016   Postpartum hypertension 03/07/2019   POTS (postural orthostatic tachycardia syndrome)    SVT (supraventricular tachycardia)    No problems since 10/2012 - no med needed   Vaginal Pap smear, abnormal    Vitamin deficiency     Past Surgical History:  Procedure Laterality Date   ATRIAL ABLATION SURGERY  2003   FAILED   CESAREAN SECTION  2006   [place]   CESAREAN SECTION  2006   breech   CESAREAN SECTION N/A 12/19/2013   Procedure:  REPEAT CESAREAN SECTION;  Surgeon: Lovenia Kim, MD;  Location: [facility];  Service: Obstetrics;  Laterality: N/A;  EDD: 12/25/13   CESAREAN SECTION N/A 01/05/2016   Procedure: Repeat CESAREAN SECTION;  Surgeon: Brien Few, MD;  Location: [facility];  Service: Obstetrics;  Laterality: N/A;  EDD: 01/12/16    CESAREAN SECTION WITH BILATERAL TUBAL LIGATION Bilateral 02/25/2019   Procedure: Repeat CESAREAN SECTION WITH BILATERAL TUBAL LIGATION;  Surgeon: Brien Few, MD;  Location: [facility];  Service: Obstetrics;  Laterality: Bilateral;  Heather, RNFA  EDD: 03/04/19 Allergy: Adhesive, Paxil, Cipro   CHOLECYSTECTOMY N/A 08/13/2019   Procedure: LAPAROSCOPIC CHOLECYSTECTOMY WITH  CHOLANGIOGRAM;  Surgeon: Greer Pickerel, MD;  Location: [facility];  Service: General;  Laterality: N/A;   COLPOSCOPY     WISDOM TOOTH EXTRACTION      Prior to Admission medications   Medication Sig Start Date End Date Taking? Authorizing Provider  amphetamine-dextroamphetamine (ADDERALL) 10 MG tablet Take 1 tablet by mouth every morning then take 1 tablet by mouth 4 hours later 04/24/22  Yes Sater, Nanine Means, MD  VUMERITY 231 MG CPDR Take 2 capsules by mouth 2 (two) times daily. 01/15/22  Yes [provider]  acetaminophen (TYLENOL) 500 MG tablet Take 1,000 mg by mouth every 6 (six) hours as needed for moderate pain or headache.    [provider]  colestipol (COLESTID) 1 g tablet Take 2 tablets (2 g total) by mouth 2 (two) times daily. Patient not taking: Reported on  06/20/2022 05/09/22   Sharyn Creamer, MD  ibuprofen (ADVIL) 200 MG tablet Take 800 mg by mouth every 6 (six) hours as needed for headache or mild pain.    [provider]  influenza vac split quadrivalent PF (FLUARIX QUADRIVALENT) 0.5 ML injection Inject into the muscle. 06/15/22   Carlyle Basques, MD  LORazepam (ATIVAN) 0.5 MG tablet Take 1 tablet (0.5 mg total) by mouth 2 (two) times daily as needed for anxiety. 02/28/22   Saguier,  Percell Miller, PA-C  ondansetron (ZOFRAN) 4 MG tablet Take 1 tablet (4 mg total) by mouth every 8 (eight) hours as needed for nausea or vomiting. 02/21/22   Debbrah Alar, NP    Current Outpatient Medications  Medication Sig Dispense Refill   amphetamine-dextroamphetamine (ADDERALL) 10 MG tablet Take 1 tablet by mouth every morning then take 1 tablet by mouth 4 hours later 60 tablet 0   VUMERITY 231 MG CPDR Take 2 capsules by mouth 2 (two) times daily.     acetaminophen (TYLENOL) 500 MG tablet Take 1,000 mg by mouth every 6 (six) hours as needed for moderate pain or headache.     colestipol (COLESTID) 1 g tablet Take 2 tablets (2 g total) by mouth 2 (two) times daily. (Patient not taking: Reported on 06/20/2022) 120 tablet 2   ibuprofen (ADVIL) 200 MG tablet Take 800 mg by mouth every 6 (six) hours as needed for headache or mild pain.     influenza vac split quadrivalent PF (FLUARIX QUADRIVALENT) 0.5 ML injection Inject into the muscle. 0.5 mL 0   LORazepam (ATIVAN) 0.5 MG tablet Take 1 tablet (0.5 mg total) by mouth 2 (two) times daily as needed for anxiety. 8 tablet 0   ondansetron (ZOFRAN) 4 MG tablet Take 1 tablet (4 mg total) by mouth every 8 (eight) hours as needed for nausea or vomiting. 20 tablet 0   Current Facility-Administered Medications  Medication Dose Route Frequency Provider Last Rate Last Admin   0.9 %  sodium chloride infusion  500 mL Intravenous Once Sharyn Creamer, MD        Allergies as of 06/20/2022 - Review Complete 06/20/2022  Allergen Reaction Noted   Ciprofloxacin Rash and Other (See Comments) 09/08/2014    Family History  Problem Relation Age of Onset   Multiple sclerosis Mother    Migraines Mother    Emphysema Father    Heart disease Father    Migraines Father    Bladder Cancer Father    Lung cancer Father    Migraines Sister    Spina bifida Brother        half brother   Celiac disease Brother    Diabetes Maternal Grandmother    Colon cancer Maternal  Grandmother    Cancer Paternal Grandfather        LUNG   Esophageal cancer Neg Hx    Stomach cancer Neg Hx     Social History   Socioeconomic History   Marital status: Married    Spouse name: Malaney Mcbean   Number of children: 4   Years of education: Not on file   Highest education level: Not on file  Occupational History   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   Occupation: RN  Tobacco Use   Smoking status: Never   Smokeless tobacco: Never  Vaping Use   Vaping Use: Never used  Substance and Sexual Activity   Alcohol use: No   Drug use: No   Sexual activity: Yes    Birth  control/protection: Pill    Comment: pregnant  Other Topics Concern   Not on file  Social History Narrative   Soda daily   Right handed    Lives with husband and 4 kids   She is ER nurse       Social Determinants of Health   Financial Resource Strain: Not on file  Food Insecurity: Not on file  Transportation Needs: Not on file  Physical Activity: Not on file  Stress: Not on file  Social Connections: Not on file  Intimate Partner Violence: Not on file    Physical Exam: Vital signs in last 24 hours: BP 103/70 (BP Location: Right Arm, Patient Position: Sitting, Cuff Size: Normal)   Pulse 86   Temp 97.8 F (36.6 C) (Temporal)   Ht '5\' 9"'$  (1.753 m)   Wt 152 lb (68.9 kg)   SpO2 (!) 9%   BMI 22.45 kg/m  GEN: NAD EYE: Sclerae anicteric ENT: MMM CV: Non-tachycardic Pulm: No increased WOB GI: Soft NEURO:  Alert & Oriented   Christia Reading, MD [facility]   06/20/2022 1:31 PM

## 2022-06-20 NOTE — Op Note (Signed)
[facility] Patient Name: Karen Powers Procedure Date: 06/20/2022 1:26 PM MRN: [id number] Endoscopist: Adline Mango [place] , , [phone] Age: 41 Referring MD:  Date of Birth: 04-19-1981 Gender: Female Account #: [id number] Procedure:                Upper GI endoscopy Indications:              Epigastric abdominal pain, , Dysphagia, Diarrhea Medicines:                Monitored Anesthesia Care Procedure:                Pre-Anesthesia Assessment:                           - Prior to the procedure, a History and Physical                            was performed, and patient medications and                            allergies were reviewed. The patient's tolerance of                            previous anesthesia was also reviewed. The risks                            and benefits of the procedure and the sedation                            options and risks were discussed with the patient.                            All questions were answered, and informed consent                            was obtained. Prior Anticoagulants: The patient has                            taken no anticoagulant or antiplatelet agents. ASA                            Grade Assessment: II - A patient with mild systemic                            disease. After reviewing the risks and benefits,                            the patient was deemed in satisfactory condition to                            undergo the procedure.                           After obtaining informed consent, the endoscope was  passed under direct vision. Throughout the                            procedure, the patient's blood pressure, pulse, and                            oxygen saturations were monitored continuously. The                            GIF HQ190 #[id number] was introduced through the                            mouth, and advanced to the second part of duodenum.                             The upper GI endoscopy was accomplished without                            difficulty. The patient tolerated the procedure                            well. Scope In: Scope Out: Findings:                 Localized mucosal changes characterized by                            nodularity were found in the proximal esophagus.                            Biopsies were taken with a cold forceps for                            histology.                           Biopsies were taken with a cold forceps in the                            proximal esophagus and in the distal esophagus for                            histology to rule out EoE.                           Localized mildly erythematous mucosa without                            bleeding was found in the gastric antrum. Biopsies                            were taken with a cold forceps for histology.                           The examined duodenum was normal. Biopsies were  taken with a cold forceps for histology. Complications:            No immediate complications. Estimated Blood Loss:     Estimated blood loss was minimal. Impression:               - Nodular mucosa in the esophagus. Biopsied.                           - Biopsies were taken with a cold forceps for                            histology in the proximal esophagus and in the                            distal esophagus to rule out EoE.                           - Erythematous mucosa in the antrum. Biopsied.                           - Normal examined duodenum. Biopsied. Recommendation:           - Use Prilosec (omeprazole) 40 mg PO BID for 8                            weeks as a trial.                           - Await pathology results.                           - Perform a colonoscopy today. Dr Georgian Co "[name]" Lorenso Courier,  06/20/2022 2:24:41 PM

## 2022-06-20 NOTE — Progress Notes (Signed)
To pacu, VSS. Report to Rn.tb 

## 2022-06-21 ENCOUNTER — Telehealth: Payer: Self-pay

## 2022-06-21 NOTE — Addendum Note (Signed)
Addended by: Samantha Crimes on: 06/21/2022 03:37 PM   Modules accepted: Orders

## 2022-06-21 NOTE — Telephone Encounter (Signed)
No answer, left message to call if having any issues or concerns, B.Surafel Hilleary RN 

## 2022-06-26 ENCOUNTER — Encounter: Payer: Self-pay | Admitting: Internal Medicine

## 2022-06-26 NOTE — Progress Notes (Signed)
Hi Beth, let's get her scheduled for esophageal manometry for further evaluation of dysphagia.

## 2022-06-27 ENCOUNTER — Ambulatory Visit: Payer: 59 | Admitting: Medical

## 2022-06-27 ENCOUNTER — Other Ambulatory Visit: Payer: Self-pay

## 2022-06-27 DIAGNOSIS — K2970 Gastritis, unspecified, without bleeding: Secondary | ICD-10-CM

## 2022-06-27 DIAGNOSIS — R1310 Dysphagia, unspecified: Secondary | ICD-10-CM

## 2022-08-14 ENCOUNTER — Other Ambulatory Visit (HOSPITAL_BASED_OUTPATIENT_CLINIC_OR_DEPARTMENT_OTHER): Payer: Self-pay

## 2022-08-14 ENCOUNTER — Other Ambulatory Visit: Payer: Self-pay | Admitting: Neurology

## 2022-08-14 ENCOUNTER — Other Ambulatory Visit: Payer: Self-pay | Admitting: Medical

## 2022-08-14 MED ORDER — AMPHETAMINE-DEXTROAMPHETAMINE 10 MG PO TABS
10.0000 mg | ORAL_TABLET | Freq: Two times a day (BID) | ORAL | 0 refills | Status: DC
  Filled 2022-08-14: qty 60, 30d supply, fill #0

## 2022-08-27 ENCOUNTER — Encounter: Payer: Self-pay | Admitting: Neurology

## 2022-08-28 ENCOUNTER — Telehealth: Payer: Self-pay

## 2022-08-28 MED ORDER — VUMERITY 231 MG PO CPDR: 2.0000 | DELAYED_RELEASE_CAPSULE | Freq: Two times a day (BID) | ORAL | 2 refills | Status: DC

## 2022-08-28 NOTE — Telephone Encounter (Signed)
Rx sent as per last OV note.

## 2022-08-30 ENCOUNTER — Other Ambulatory Visit: Payer: Self-pay | Admitting: Medical

## 2022-08-31 ENCOUNTER — Other Ambulatory Visit (HOSPITAL_BASED_OUTPATIENT_CLINIC_OR_DEPARTMENT_OTHER): Payer: Self-pay

## 2022-08-31 MED ORDER — LORAZEPAM 0.5 MG PO TABS
0.5000 mg | ORAL_TABLET | Freq: Three times a day (TID) | ORAL | 0 refills | Status: DC | PRN
  Filled 2022-08-31: qty 4, 2d supply, fill #0

## 2022-08-31 NOTE — Telephone Encounter (Signed)
Sporadic rare use of ativan. Did sent her 4 tabs instead of 8. Ask pt to schedule follow/wellness exam in February. Will discuss anxiety on that visit as well. How many she needs etc.

## 2022-08-31 NOTE — Telephone Encounter (Signed)
Requesting: ativan  Contract: UDS: Last Visit: Next Visit: Last Refill:03/01/22 -- 8 tablets  Please Advise

## 2022-10-10 IMAGING — CR DG HIP (WITH OR WITHOUT PELVIS) 2-3V*L*
3 series · 3 of 3 positions shown · non-contrast
Comparison: None.

CLINICAL DATA: Left hip pain for 6 weeks. Pain with range of
motion.

EXAM:
DG HIP (WITH OR WITHOUT PELVIS) 2-3V LEFT

[t pelvis a.p.]
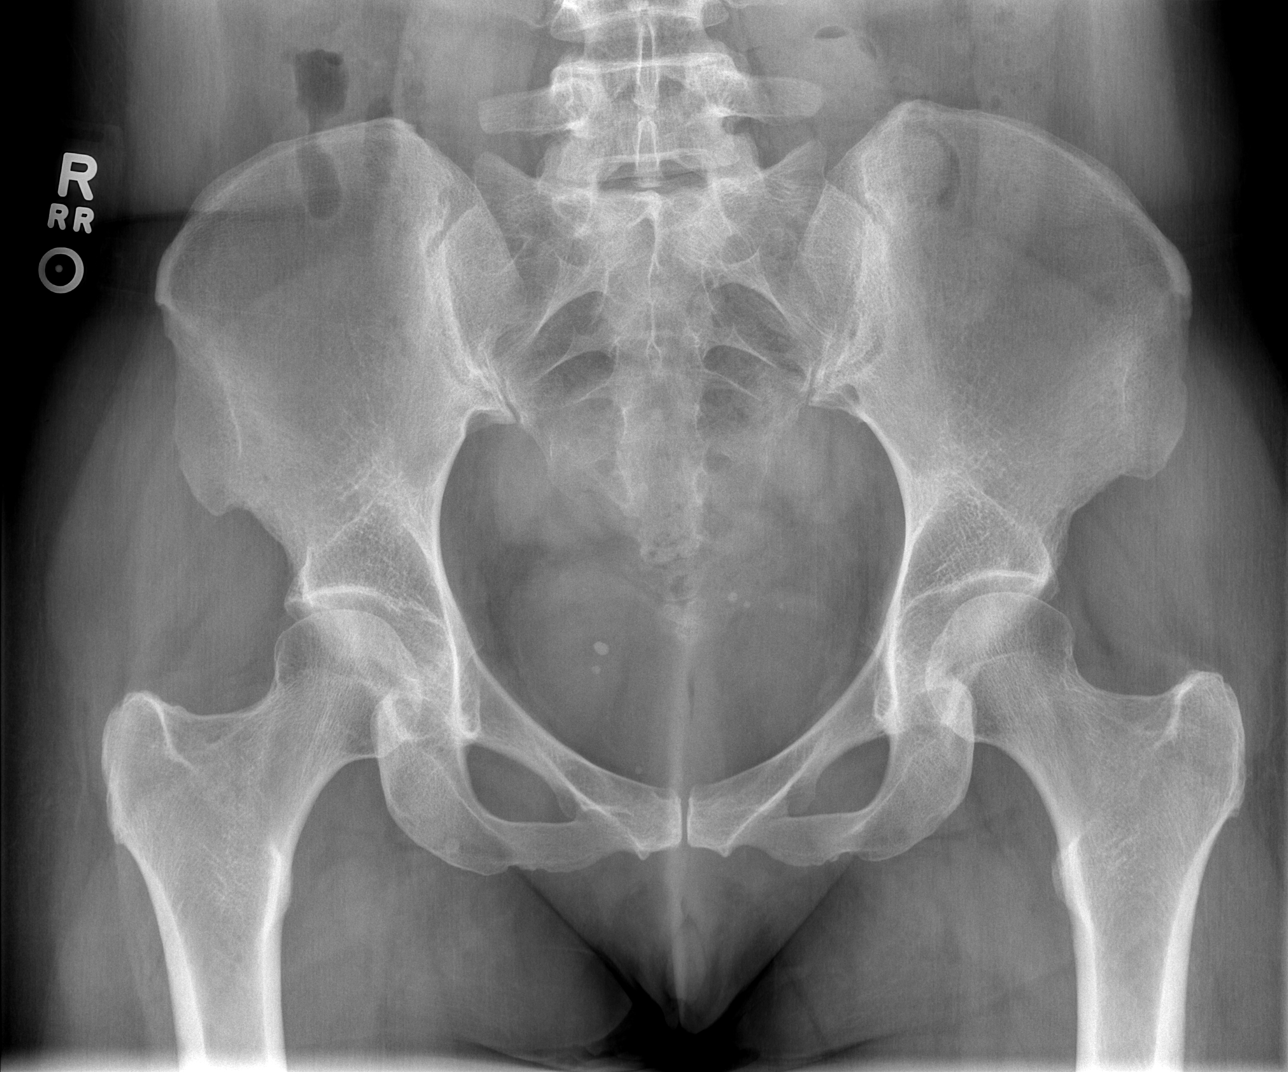

[t hip ap left]
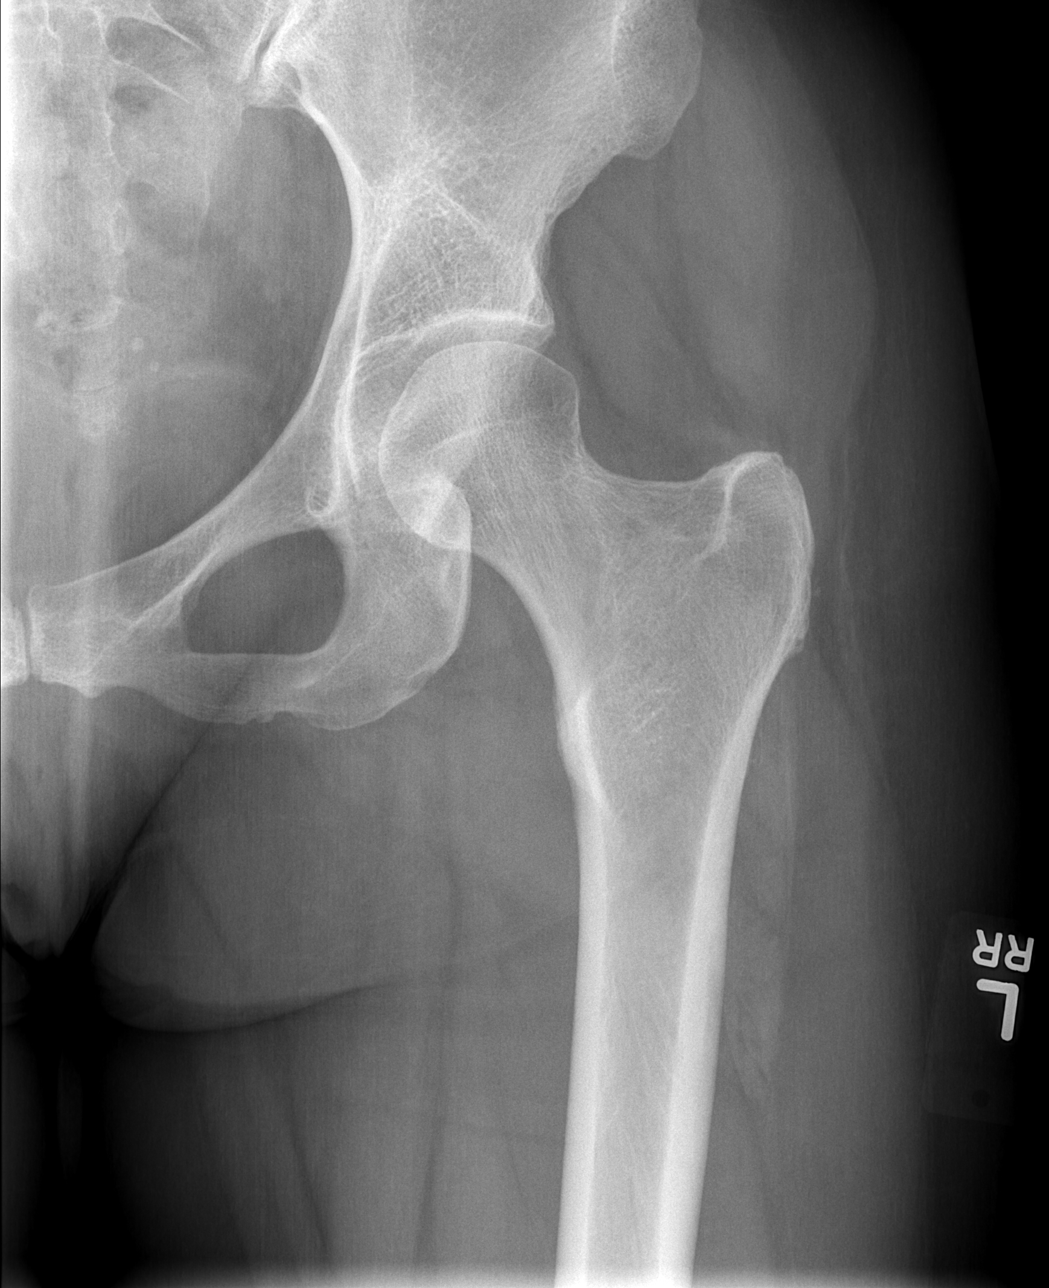

[t hip frog leg left]
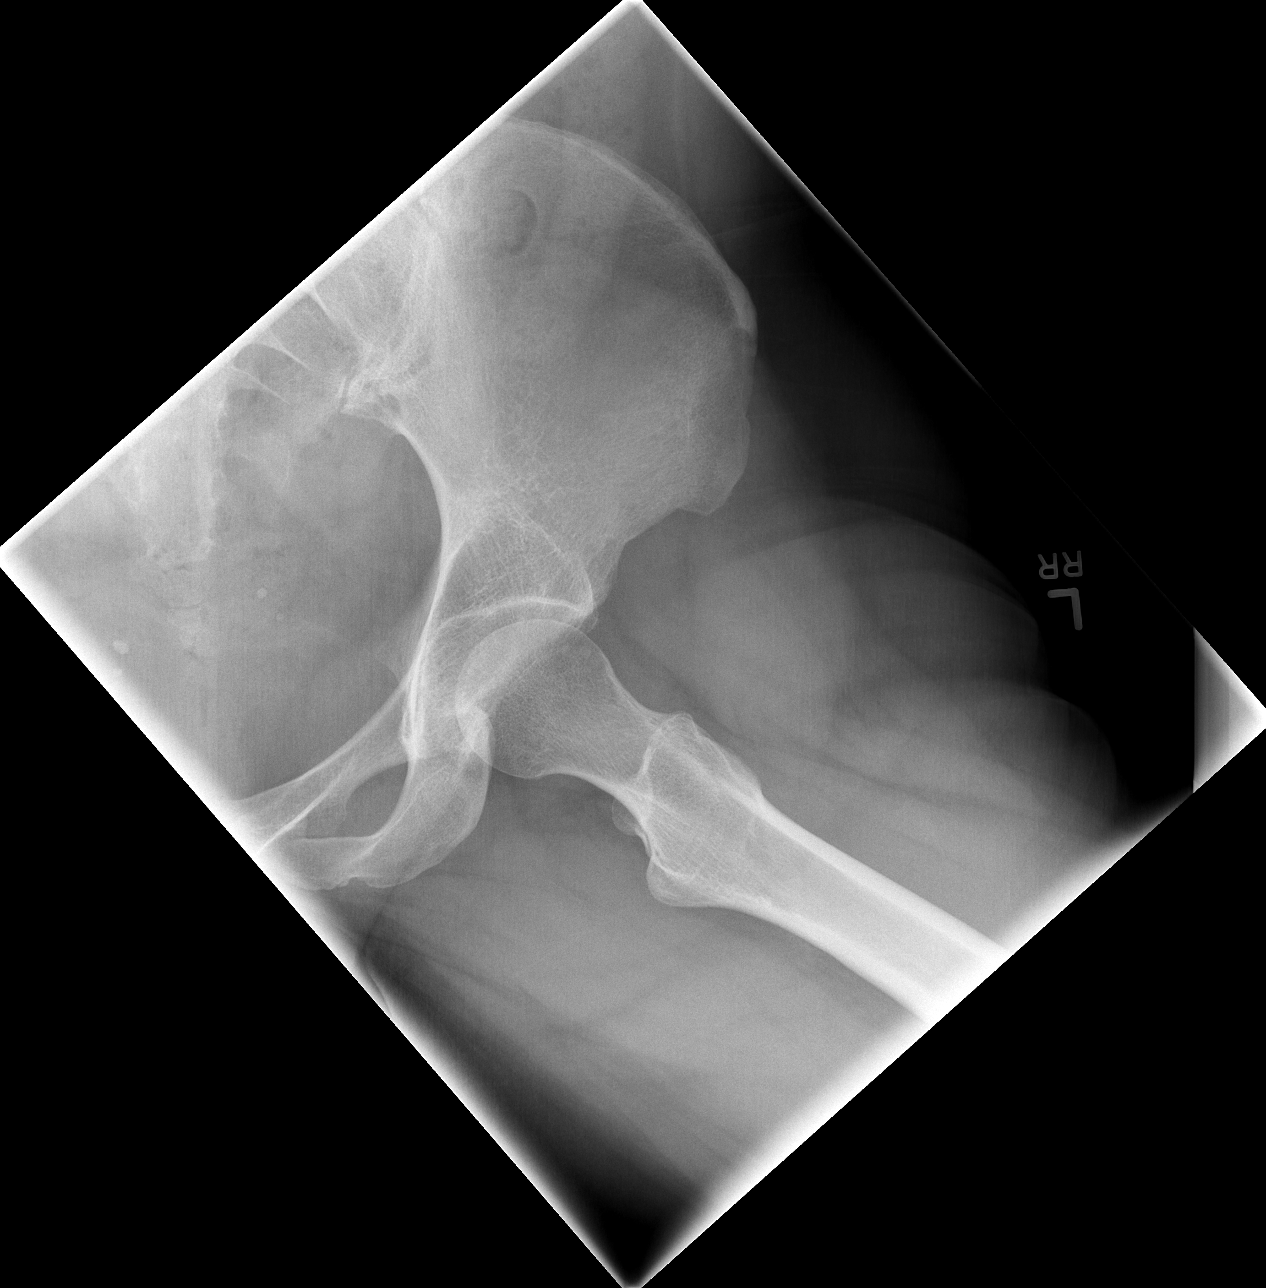

[3 of 3 positions shown; findings below may reference images not displayed]

FINDINGS: Hip joint space is normal. Normal alignment. Femoral head is well
seated. No osteophytes, erosion, or evidence of avascular necrosis.
The pubic rami and remainder of the bony pelvis is intact. Pubic
symphysis and sacroiliac joints are congruent. No periosteal
reaction. Unremarkable soft tissues.
IMPRESSION: Negative radiographs of the left hip.

## 2022-11-01 NOTE — Telephone Encounter (Signed)
Submitted urgent PA on covermymeds. Waiting on determination from [place].

## 2022-11-01 NOTE — Telephone Encounter (Signed)
"  The request has been approved. The authorization is effective from 11/01/2022 to 11/01/2023, as long as the member is enrolled in their current health plan. A written notification letter will follow with additional details.. Authorization Expiration Date: November 01, 2023."

## 2022-11-01 NOTE — Telephone Encounter (Signed)
Jewel Baize From [employer] called stating that the pt is needing a PA for this medication.  Cover my Meds AS:[id number]

## 2022-11-06 ENCOUNTER — Encounter: Payer: Self-pay | Admitting: Neurology

## 2022-11-06 ENCOUNTER — Other Ambulatory Visit (HOSPITAL_BASED_OUTPATIENT_CLINIC_OR_DEPARTMENT_OTHER): Payer: Self-pay

## 2022-11-06 ENCOUNTER — Ambulatory Visit: Payer: Commercial Managed Care - PPO | Admitting: Neurology

## 2022-11-06 ENCOUNTER — Telehealth: Payer: Self-pay | Admitting: Neurology

## 2022-11-06 VITALS — BP 129/83 | HR 81 | Ht 70.0 in | Wt 143.5 lb

## 2022-11-06 DIAGNOSIS — G35 Multiple sclerosis: Secondary | ICD-10-CM

## 2022-11-06 DIAGNOSIS — Z79899 Other long term (current) drug therapy: Secondary | ICD-10-CM

## 2022-11-06 MED ORDER — TRAZODONE HCL 50 MG PO TABS
50.0000 mg | ORAL_TABLET | Freq: Every day | ORAL | 11 refills | Status: DC
  Filled 2022-11-06: qty 30, 30d supply, fill #0
  Filled 2023-01-01: qty 30, 30d supply, fill #1
  Filled 2023-02-06: qty 30, 30d supply, fill #2
  Filled 2023-03-29: qty 30, 30d supply, fill #3
  Filled 2023-05-30: qty 30, 30d supply, fill #4
  Filled 2023-07-22: qty 30, 30d supply, fill #5
  Filled 2023-08-27: qty 30, 30d supply, fill #6
  Filled 2023-10-04: qty 30, 30d supply, fill #7
  Filled 2023-11-04: qty 30, 30d supply, fill #8

## 2022-11-06 MED ORDER — AMPHETAMINE-DEXTROAMPHETAMINE 10 MG PO TABS
10.0000 mg | ORAL_TABLET | Freq: Two times a day (BID) | ORAL | 0 refills | Status: DC
  Filled 2022-11-06: qty 60, 30d supply, fill #0

## 2022-11-06 NOTE — Telephone Encounter (Signed)
[employer] sent to GI they obtain auth [id number] 

## 2022-11-06 NOTE — Progress Notes (Signed)
[facility]  PATIENT: Karen Powers DOB: 15-Nov-1980  REFERRING DOCTOR OR PCP: Mackie Pai, PA-C SOURCE: Patient, notes from neurology, imaging and laboratory reports, MRI images personally reviewed.  _________________________________   HISTORICAL  CHIEF COMPLAINT:  Chief Complaint  Patient presents with   Follow-up    RM 1, alone. Last seen 11/07/21. MS DMT: Vumerity. No issues, doing well.     HISTORY OF PRESENT ILLNESS:  Karen Powers is a 42 y.o. woman with multiple sclerosis and treatment options.  Update 11/06/2022 She is doing well with no new neurologic symptoms presently.   She did have 2 days of left leg achy pian > dysesthetic pain..  She is on  Vumerity and tolerates it well.   No exacerbations.  Gait is doing well.   She does not need to use the bannister on stairs but might on some stairs.  No falls.    No weakness or numbness.     She has no urinary hesitancy but is not sure completely empties. Rarely she had   She has had a few UTIs     Vision is ok though she has trouble focusing on words at time.    Her OD vision is generally worse than OS.  Color vision is symmetric and eye exams in past have been fine.   She notes some dysphagia and has had an EGD and manometry is being considered.   She sees Dr. Sonny Masters.   Karen Powers is not sure why manometry is going to be done.  She reports they found no stricture. .      She has fatigue and some mild mental fog.    She has difficulty with focus/attention and Adderall helps.  She works nights on weekends and sleep is variable (works nights on weekends).   She has trouble falling asleep.   In the past, she was placed on Seroquel for her insomnia but she had more anxiety at that time.  She has not tried trazodone..     She is a Marine scientist.   She denies significant depression but occasionally feels down.       MS Hstory She is a 42 year old woman who was diagnosed with MS in 2009 at age 42.   In September 2009, she  was experiencing numbness in her right leg,   Her mother had MS prompting an evaluation with an MRI showing several periventricular T2 hyperintense foci and a focus in the left cerebral peduncle.    CSF was analyzed and was also consistent with MS,   She was diagnosed by Dr. Jacolyn Reedy and then started to see Dr. Jacqulynn Cadet in [place]   She was started on Copaxone but only stayed on a couple years.   She was starting a family and had several more prognosis over the next 10 years..  She was seen Dr. Shelia Media at [facility].  In 2018, she had some visual changes and initiation of a disease modifying therapy was recommended though she did not start due to insurance issues., She had additional neurologic symptoms January or February 2019.   She noted that when she would turn her head to the right, she would get a wet sensation in her right leg.    She then had an episode of severe vertigo a week or two later that persisted a couple weeks.  MRI 10/22/2017 showed several enhancing lesions in the cervical spine and no new lesions in the brain.  She received a few days of IV Solumedrol  and was better 1-2 weeks later and symptoms resolved a week or two after that.     She was planning an additional pregnancy so held off a treatment   In December 2022, she felt more off balanced but MRI brain and spine showed no new lesions.   She has since improved and is back at baseline.  She started Vumerity January 2023.  She has 4 children born 2006, 2015, 2017, and 2020.   She is not planning additional children  Imaging:  MRI of the brain 08/04/2021 showed scattered T2/FLAIR hyperintense foci predominantly in the periventricular white matter.  None of the foci enhance or appear to be acute.  No new lesions compared to 10/22/2017  MRI of the cervical and thoracic spine 08/04/2021 showed subtle T2 hyperintense foci in the upper cervical spine, with improved appearance compared to the 2019 MRI.  No enhancing lesions.  No additional lesions.  The  MRI of the thoracic spine did not show any MS lesions.  MRI of the cervical spine 10/22/2017 showed several enhancing lesions in the cervical spine, to the left adjacent to C2, anteriorly adjacent to C2 and to the right adjacent to C2-C3.  An additional nonenhancing lesion is noted to the left adjacent to C4.  None of these foci were present on the MRI from 07/13/2017 (normal spinal cord)  MRI of the brain 10/22/2017 showed scattered T2/FLAIR hyperintense foci in the hemispheres, predominantly in the periventricular white matter.  None of the foci enhance.  No definite new lesions compared to 07/13/2017.  MRI of the brain 07/13/2017 showed scattered T2/FLAIR hyperintense foci.  Dominantly in the periventricular white matter with a subtle focus in the left cerebral peduncle.  There has been mild progression compared to the 10/20/2010 MRI.  MRI of the brain 10/20/2010 and 05/17/2008 showed T2/FLAIR hyperintense foci predominantly in the periventricular white matter.  These 2 studies were stable.  REVIEW OF SYSTEMS: Constitutional: No fevers, chills, sweats, or change in appetite Eyes: No visual changes, double vision, eye pain Ear, nose and throat: No hearing loss, ear pain, nasal congestion, sore throat Cardiovascular: No chest pain, palpitations Respiratory:  No shortness of breath at rest or with exertion.   No wheezes GastrointestinaI: No nausea, vomiting, diarrhea, abdominal pain, fecal incontinence Genitourinary:  No dysuria, urinary retention or frequency.  No nocturia. Musculoskeletal:  No neck pain, back pain Integumentary: No rash, pruritus, skin lesions Neurological: as above Psychiatric: No depression at this time.  No anxiety Endocrine: No palpitations, diaphoresis, change in appetite, change in weigh or increased thirst Hematologic/Lymphatic:  No anemia, purpura, petechiae. Allergic/Immunologic: No itchy/runny eyes, nasal congestion, recent allergic reactions,  rashes  ALLERGIES: Allergies  Allergen Reactions   Ciprofloxacin Rash and Other (See Comments)    Has patient had a PCN reaction causing immediate rash, facial/tongue/throat swelling, SOB or lightheadedness with hypotension: No Has patient had a PCN reaction causing severe rash involving mucus membranes or skin necrosis: No Has patient had a PCN reaction that required hospitalization No Has patient had a PCN reaction occurring within the last 10 years: Yes If all of the above answers are "NO", then may proceed with Cephalosporin use.    HOME MEDICATIONS:  Current Outpatient Medications:    acetaminophen (TYLENOL) 500 MG tablet, Take 1,000 mg by mouth every 6 (six) hours as needed for moderate pain or headache., Disp: , Rfl:    amphetamine-dextroamphetamine (ADDERALL) 10 MG tablet, Take 1 tablet by mouth every morning then take 1 tablet by mouth  4 hours later, Disp: 60 tablet, Rfl: 0   ibuprofen (ADVIL) 200 MG tablet, Take 800 mg by mouth every 6 (six) hours as needed for headache or mild pain., Disp: , Rfl:    LORazepam (ATIVAN) 0.5 MG tablet, Take 1 tablet (0.5 mg total) by mouth 2 (two) times daily as needed for anxiety., Disp: 8 tablet, Rfl: 0   ondansetron (ZOFRAN) 4 MG tablet, Take 1 tablet (4 mg total) by mouth every 8 (eight) hours as needed for nausea or vomiting., Disp: 20 tablet, Rfl: 0   VUMERITY 231 MG CPDR, Take 2 capsules by mouth 2 (two) times daily., Disp: , Rfl:   PAST MEDICAL HISTORY: Past Medical History:  Diagnosis Date   Acute blood loss anemia 01/06/2016   Anemia    Diabetes mellitus without complication ([place]) 123456   Gestational diabetes only.   Dysrhythmia    Gestational diabetes    2nd preg only   Headache(784.0)    otc med prn   Heartburn in pregnancy    Maternal iron deficiency anemia 01/06/2016   MS (multiple sclerosis) ([place])    Postpartum care following cesarean delivery (5/11) 01/05/2016   Postpartum hypertension 03/07/2019   POTS (postural orthostatic  tachycardia syndrome)    SVT (supraventricular tachycardia) ([place])    No problems since 10/2012 - no med needed   Vaginal Pap smear, abnormal    Vitamin deficiency     PAST SURGICAL HISTORY: Past Surgical History:  Procedure Laterality Date   ATRIAL ABLATION SURGERY  2003   FAILED   CESAREAN SECTION  2006   Breech   CESAREAN SECTION  2006   breech   CESAREAN SECTION N/A 12/19/2013   Procedure: REPEAT CESAREAN SECTION;  Surgeon: Lovenia Kim, MD;  Location: [facility];  Service: Obstetrics;  Laterality: N/A;  EDD: 12/25/13   CESAREAN SECTION N/A 01/05/2016   Procedure: Repeat CESAREAN SECTION;  Surgeon: Brien Few, MD;  Location: [facility];  Service: Obstetrics;  Laterality: N/A;  EDD: 01/12/16    CESAREAN SECTION WITH BILATERAL TUBAL LIGATION Bilateral 02/25/2019   Procedure: Repeat CESAREAN SECTION WITH BILATERAL TUBAL LIGATION;  Surgeon: Brien Few, MD;  Location: [facility];  Service: Obstetrics;  Laterality: Bilateral;  Heather, RNFA  EDD: 03/04/19 Allergy: Adhesive, Paxil, Cipro   CHOLECYSTECTOMY N/A 08/13/2019   Procedure: LAPAROSCOPIC CHOLECYSTECTOMY WITH  CHOLANGIOGRAM;  Surgeon: Greer Pickerel, MD;  Location: [facility];  Service: General;  Laterality: N/A;   COLPOSCOPY     WISDOM TOOTH EXTRACTION      FAMILY HISTORY: Family History  Problem Relation Age of Onset   Multiple sclerosis Mother    Migraines Mother    Emphysema Father    Heart disease Father    Migraines Father    Migraines Sister    Spina bifida Brother        half brother   Cancer Paternal Grandfather        LUNG   Diabetes Maternal Grandmother     SOCIAL HISTORY:  Social History   Socioeconomic History   Marital status: Married    Spouse name: Teliah Barbieri   Number of children: 4   Years of education: Not on file   Highest education level: Not on file  Occupational History   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  Tobacco Use   Smoking status: Never   Smokeless tobacco: Never  Vaping  Use   Vaping Use: Never used  Substance and Sexual Activity   Alcohol use: No  Drug use: No   Sexual activity: Yes    Birth control/protection: Pill    Comment: pregnant  Other Topics Concern   Not on file  Social History Narrative   Soda daily   Right handed    Lives with husband and 4 kids   She is ER nurse       Social Determinants of Health   Financial Resource Strain: Not on file  Food Insecurity: Not on file  Transportation Needs: Not on file  Physical Activity: Not on file  Stress: Not on file  Social Connections: Not on file  Intimate Partner Violence: Not on file     PHYSICAL EXAM  Vitals:   04/24/22 1101  BP: 124/74  Pulse: 70  Weight: 152 lb 8 oz (69.2 kg)  Height: 5' 9.5" (1.765 m)    Body mass index is 22.2 kg/m.   General: The patient is well-developed and well-nourished and in no acute distress  HEENT:  Head is [place]/AT.  Sclera are anicteric.    Skin: Extremities are without rash or  edema.  Musculoskeletal:  Back is nontender  Neurologic Exam  Mental status: The patient is alert and oriented x 3 at the time of the examination. The patient has apparent normal recent and remote memory, with an apparently normal attention span and concentration ability.   Speech is normal.  Cranial nerves: Extraocular movements are full.  Facial strength and sensation was normal.  Trapezius and sternocleidomastoid strength is normal. No dysarthria is noted.  . No obvious hearing deficits are noted.  Motor:  Muscle bulk is normal.   Tone is normal. Strength is  5 / 5 in all 4 extremities.   Sensory: Sensory testing is intact to pinprick, soft touch and vibration sensation in all 4 extremities.  Coordination: Cerebellar testing reveals good finger-nose-finger and heel-to-shin bilaterally.  Gait and station: Station is normal.   Gait is normal. Tandem gait is normal. Romberg is negative.   Reflexes: Deep tendon reflexes are symmetric and normal bilaterally.        DIAGNOSTIC DATA (LABS, IMAGING, TESTING) - I reviewed patient records, labs, notes, testing and imaging myself where available.  Lab Results  Component Value Date   WBC 4.9 09/19/2021   HGB 11.9 (L) 09/19/2021   HCT 34.9 (L) 09/19/2021   MCV 90.7 09/19/2021   PLT 212.0 09/19/2021      Component Value Date/Time   NA 139 09/19/2021 1040   K 4.2 09/19/2021 1040   CL 106 09/19/2021 1040   CO2 28 09/19/2021 1040   GLUCOSE 92 09/19/2021 1040   BUN 6 09/19/2021 1040   CREATININE 0.65 09/19/2021 1040   CREATININE 0.78 03/30/2020 0838   CALCIUM 8.7 09/19/2021 1040   PROT 6.4 09/19/2021 1040   ALBUMIN 4.3 09/19/2021 1040   AST 14 09/19/2021 1040   ALT 12 09/19/2021 1040   ALKPHOS 56 09/19/2021 1040   BILITOT 0.8 09/19/2021 1040   GFRNONAA >60 08/03/2021 2215   GFRAA >60 08/13/2019 0420   Lab Results  Component Value Date   CHOL 158 09/19/2021   HDL 52.90 09/19/2021   LDLCALC 97 09/19/2021   TRIG 41.0 09/19/2021   CHOLHDL 3 09/19/2021   No results found for: "HGBA1C" Lab Results  Component Value Date   VITAMINB12 183 (L) 09/19/2021   Lab Results  Component Value Date   TSH 0.97 09/19/2021       ASSESSMENT AND PLAN  Multiple sclerosis ([place]) - Plan: CBC with Differential/Platelet, Comprehensive  metabolic panel  Attention deficit disorder (ADD) in adult  Other fatigue  Numbness  Urinary disorder  High risk medication use - Plan: CBC with Differential/Platelet, Comprehensive metabolic panel   Continue Vumerity.   Check labs.  Check MRI brain to determine if any breakthrough activity and consider a different DMT if occurring.  Continue Adderall 10 mg po bid for MS related ADD If back/le pain recurs, consider MRI lumbar to further characterizaRtc 6 months or sooner if new or worsening symptoms.   Mayvis Agudelo A. Felecia Shelling, MD, [facility] [id number], [id number] PM Certified in Neurology, Clinical Neurophysiology, Sleep Medicine and Neuroimaging  [facility] [address], [place], [place] [address] [phone]

## 2022-11-07 LAB — CBC WITH DIFFERENTIAL/PLATELET
Basophils Absolute: 0 10*3/uL (ref 0.0–0.2)
Basos: 1 %
EOS (ABSOLUTE): 0.1 10*3/uL (ref 0.0–0.4)
Eos: 2 %
Hematocrit: 38.4 % (ref 34.0–46.6)
Hemoglobin: 12.7 g/dL (ref 11.1–15.9)
Immature Grans (Abs): 0 10*3/uL (ref 0.0–0.1)
Immature Granulocytes: 0 %
Lymphocytes Absolute: 1.3 10*3/uL (ref 0.7–3.1)
Lymphs: 22 %
MCH: 29.7 pg (ref 26.6–33.0)
MCHC: 33.1 g/dL (ref 31.5–35.7)
MCV: 90 fL (ref 79–97)
Monocytes Absolute: 0.5 10*3/uL (ref 0.1–0.9)
Monocytes: 8 %
Neutrophils Absolute: 3.9 10*3/uL (ref 1.4–7.0)
Neutrophils: 67 %
Platelets: 177 10*3/uL (ref 150–450)
RBC: 4.27 x10E6/uL (ref 3.77–5.28)
RDW: 12.7 % (ref 11.7–15.4)
WBC: 5.7 10*3/uL (ref 3.4–10.8)

## 2022-11-07 LAB — HEPATIC FUNCTION PANEL
ALT: 21 IU/L (ref 0–32)
AST: 19 IU/L (ref 0–40)
Albumin: 4.9 g/dL (ref 3.9–4.9)
Alkaline Phosphatase: 64 IU/L (ref 44–121)
Bilirubin Total: 1 mg/dL (ref 0.0–1.2)
Bilirubin, Direct: 0.23 mg/dL (ref 0.00–0.40)
Total Protein: 7.1 g/dL (ref 6.0–8.5)

## 2022-11-12 ENCOUNTER — Encounter: Payer: Self-pay | Admitting: Internal Medicine

## 2022-11-13 NOTE — Telephone Encounter (Signed)
Called the patient twice, once this morning (went to voicemail and I left a voicemail letting her know that I called) and another time this afternoon (left another voicemail letting her know that we would reply on MyChart). Hi Beth, please let the patient know that the esophageal manometry test would be designed to look for an esophageal motility disorder. Some of these motility disorders can benefit from medical therapy and/or surgical therapy, but if the patient would prefer to hold off on this for now and just manage her dysphagia symptomatically, I am okay with that. Just let me know. Thanks.

## 2022-11-21 ENCOUNTER — Encounter (HOSPITAL_COMMUNITY): Admission: RE | Payer: Self-pay | Source: Home / Self Care

## 2022-11-21 ENCOUNTER — Ambulatory Visit (HOSPITAL_COMMUNITY)
Admission: RE | Admit: 2022-11-21 | Payer: Commercial Managed Care - PPO | Source: Home / Self Care | Admitting: Internal Medicine

## 2022-11-21 SURGERY — MANOMETRY, ESOPHAGUS
Anesthesia: Choice

## 2022-11-26 ENCOUNTER — Other Ambulatory Visit: Payer: Self-pay | Admitting: Neurology

## 2022-11-26 NOTE — Telephone Encounter (Signed)
Last seen on 11/06/22 Follow up scheduled on 05/22/23 Last filled on 11/05/22 # 120 (30 day supply)

## 2022-12-03 ENCOUNTER — Encounter: Payer: Self-pay | Admitting: Medical

## 2022-12-03 ENCOUNTER — Other Ambulatory Visit (HOSPITAL_BASED_OUTPATIENT_CLINIC_OR_DEPARTMENT_OTHER): Payer: Self-pay

## 2022-12-03 ENCOUNTER — Ambulatory Visit (INDEPENDENT_AMBULATORY_CARE_PROVIDER_SITE_OTHER): Payer: Commercial Managed Care - PPO | Admitting: Medical

## 2022-12-03 VITALS — BP 105/82 | HR 87 | Resp 18 | Ht 70.0 in | Wt 141.6 lb

## 2022-12-03 DIAGNOSIS — R300 Dysuria: Secondary | ICD-10-CM

## 2022-12-03 LAB — POCT URINALYSIS DIPSTICK
Bilirubin, UA: NEGATIVE
Glucose, UA: NEGATIVE
Ketones, UA: 80
Nitrite, UA: NEGATIVE
Protein, UA: NEGATIVE
Spec Grav, UA: 1.02 (ref 1.010–1.025)
Urobilinogen, UA: 0.2 E.U./dL
pH, UA: 6 (ref 5.0–8.0)

## 2022-12-03 MED ORDER — MELOXICAM 7.5 MG PO TABS
7.5000 mg | ORAL_TABLET | Freq: Every day | ORAL | 0 refills | Status: DC
  Filled 2022-12-03: qty 30, 30d supply, fill #0

## 2022-12-03 MED ORDER — CEPHALEXIN 500 MG PO CAPS
500.0000 mg | ORAL_CAPSULE | Freq: Two times a day (BID) | ORAL | 0 refills | Status: DC
  Filled 2022-12-03: qty 14, 7d supply, fill #0

## 2022-12-03 NOTE — Patient Instructions (Addendum)
Your appear to have a urinary tract infection. I am prescribing  keflex antibiotic for the probable infection. Hydrate well. I am sending out a urine culture. During the interim if your signs and symptoms worsen rather than improving please notify us. We will notify your when the culture results are back.  Refilling mobic for occasional rt hip pain.  Follow up in 7 days or as needed.

## 2022-12-03 NOTE — Progress Notes (Signed)
Subjective:    Patient ID: Karen Powers, female    DOB: Aug 08, 1981, 42 y.o.   MRN: [id number]  HPI  Pt in for recent uti signs and symptoms.  Since Saturday has some burning on urination. Some frequent urination with odor. Hx of uti. Pt has recenlty been trying to hydrate well as early as has symptoms.   Took test at home and did have nitrates.   Pt get occasional uti in past. Pt sometimes holds urine at work also has MS ad feels does not go when needed. No fever, no chills or sweats.     Also has rt hip pain. Known torn labrum left side and MD she saw thinks may have rt side tear. Pt in the past has used meloxicam spraingly on occasion for hip pain. Request refill.  Review of Systems  Constitutional:  Negative for chills, fatigue and fever.  Respiratory:  Negative for chest tightness, shortness of breath and wheezing.   Cardiovascular:  Negative for chest pain and palpitations.  Gastrointestinal:  Negative for abdominal pain and blood in stool.  Genitourinary:  Positive for dysuria and frequency. Negative for difficulty urinating, flank pain, pelvic pain and urgency.  Musculoskeletal:  Negative for back pain and joint swelling.  Neurological:  Negative for dizziness, syncope, facial asymmetry, speech difficulty, weakness and light-headedness.  Hematological:  Negative for adenopathy. Does not bruise/bleed easily.  Psychiatric/Behavioral:  Negative for behavioral problems and decreased concentration.     Past Medical History:  Diagnosis Date   Acute blood loss anemia 01/06/2016   Anemia    Diabetes mellitus without complication 2015   Gestational diabetes only.   Dysrhythmia    Gallstones    Gestational diabetes    2nd preg only   Headache(784.0)    otc med prn   Heartburn in pregnancy    IBS (irritable bowel syndrome)    Maternal iron deficiency anemia 01/06/2016   MS (multiple sclerosis)    Postpartum care following cesarean delivery (5/11) 01/05/2016    Postpartum hypertension 03/07/2019   POTS (postural orthostatic tachycardia syndrome)    SVT (supraventricular tachycardia)    No problems since 10/2012 - no med needed   Vaginal Pap smear, abnormal    Vitamin deficiency      Social History   Socioeconomic History   Marital status: Married    Spouse name: Shikita Vaillancourt   Number of children: 4   Years of education: Not on file   Highest education level: Not on file  Occupational History   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   Occupation: RN  Tobacco Use   Smoking status: Never   Smokeless tobacco: Never  Vaping Use   Vaping Use: Never used  Substance and Sexual Activity   Alcohol use: No   Drug use: No   Sexual activity: Yes    Birth control/protection: Pill    Comment: pregnant  Other Topics Concern   Not on file  Social History Narrative   Soda daily   Right handed    Lives with husband and 4 kids   She is ER Engineer, civil (consulting)       Social Determinants of Health   Financial Resource Strain: Not on file  Food Insecurity: Not on file  Transportation Needs: Not on file  Physical Activity: Not on file  Stress: Not on file  Social Connections: Not on file  Intimate Partner Violence: Not on file    Past Surgical History:  Procedure Laterality Date   ATRIAL  ABLATION SURGERY  2003   FAILED   CESAREAN SECTION  2006   Breech   CESAREAN SECTION  2006   breech   CESAREAN SECTION N/A 12/19/2013   Procedure: REPEAT CESAREAN SECTION;  Surgeon: Lenoard Aden, MD;  Location: [facility];  Service: Obstetrics;  Laterality: N/A;  EDD: 12/25/13   CESAREAN SECTION N/A 01/05/2016   Procedure: Repeat CESAREAN SECTION;  Surgeon: Olivia Mackie, MD;  Location: [facility];  Service: Obstetrics;  Laterality: N/A;  EDD: 01/12/16    CESAREAN SECTION WITH BILATERAL TUBAL LIGATION Bilateral 02/25/2019   Procedure: Repeat CESAREAN SECTION WITH BILATERAL TUBAL LIGATION;  Surgeon: Olivia Mackie, MD;  Location: [facility];  Service: Obstetrics;   Laterality: Bilateral;  Heather, RNFA  EDD: 03/04/19 Allergy: Adhesive, Paxil, Cipro   CHOLECYSTECTOMY N/A 08/13/2019   Procedure: LAPAROSCOPIC CHOLECYSTECTOMY WITH  CHOLANGIOGRAM;  Surgeon: Gaynelle Adu, MD;  Location: [facility];  Service: General;  Laterality: N/A;   COLPOSCOPY     WISDOM TOOTH EXTRACTION      Family History  Problem Relation Age of Onset   Multiple sclerosis Mother    Migraines Mother    Emphysema Father    Heart disease Father    Migraines Father    Bladder Cancer Father    Lung cancer Father    Migraines Sister    Spina bifida Brother        half brother   Celiac disease Brother    Diabetes Maternal Grandmother    Colon cancer Maternal Grandmother    Cancer Paternal Grandfather        LUNG   Esophageal cancer Neg Hx    Stomach cancer Neg Hx     Allergies  Allergen Reactions   Ciprofloxacin Rash and Other (See Comments)    Has patient had a PCN reaction causing immediate rash, facial/tongue/throat swelling, SOB or lightheadedness with hypotension: No Has patient had a PCN reaction causing severe rash involving mucus membranes or skin necrosis: No Has patient had a PCN reaction that required hospitalization No Has patient had a PCN reaction occurring within the last 10 years: Yes If all of the above answers are "NO", then may proceed with Cephalosporin use.    Current Outpatient Medications on File Prior to Visit  Medication Sig Dispense Refill   acetaminophen (TYLENOL) 500 MG tablet Take 1,000 mg by mouth every 6 (six) hours as needed for moderate pain or headache.     amphetamine-dextroamphetamine (ADDERALL) 10 MG tablet Take 1 tablet (10 mg total) by mouth every morning and then take 1 tablet (10 mg total) by mouth 4 hours later. 60 tablet 0   ibuprofen (ADVIL) 200 MG tablet Take 800 mg by mouth every 6 (six) hours as needed for headache or mild pain.     influenza vac split quadrivalent PF (FLUARIX QUADRIVALENT) 0.5 ML injection Inject into the muscle.  0.5 mL 0   LORazepam (ATIVAN) 0.5 MG tablet Take 1 tablet (0.5 mg total) by mouth every 8 (eight) hours as needed for anxiety. 4 tablet 0   omeprazole (PRILOSEC) 40 MG capsule Take 1 capsule (40 mg total) by mouth in the morning and at bedtime. 112 capsule 0   ondansetron (ZOFRAN) 4 MG tablet Take 1 tablet (4 mg total) by mouth every 8 (eight) hours as needed for nausea or vomiting. 20 tablet 0   traZODone (DESYREL) 50 MG tablet Take 1 tablet (50 mg total) by mouth at bedtime. 30 tablet 11   VUMERITY 231 MG  CPDR TAKE 2 CAPSULES (462MG ) BY MOUTH TWICE DAILY 120 capsule 2   colestipol (COLESTID) 1 g tablet Take 2 tablets (2 g total) by mouth 2 (two) times daily. (Patient not taking: Reported on 12/03/2022) 120 tablet 2   No current facility-administered medications on file prior to visit.    BP 105/82 (BP Location: Right Arm, Patient Position: Sitting, Cuff Size: Normal)   Pulse 87   Resp 18   Ht 5\' 10"  (1.778 m)   Wt 141 lb 9.6 oz (64.2 kg)   LMP 11/23/2022   SpO2 100%   BMI 20.32 kg/m        Objective:   Physical Exam  General Mental Status- Alert. General Appearance- Not in acute distress.   Skin General: Color- Normal Color. Moisture- Normal Moisture.  Neck Carotid Arteries- Normal color. Moisture- Normal Moisture. No carotid bruits. No JVD.  Chest and Lung Exam Auscultation: Breath Sounds:-Normal.  Cardiovascular Auscultation:Rythm- Regular. Murmurs & Other Heart Sounds:Auscultation of the heart reveals- No Murmurs.  Abdomen Inspection:-Inspeection Normal. Palpation/Percussion:Note:No mass. Palpation and Percussion of the abdomen reveal- Non Tender, Non Distended + BS, no rebound or guarding.   Neurologic Cranial Nerve exam:- CN III-XII intact(No nystagmus), symmetric smile. Strength:- 5/5 equal and symmetric strength both upper and lower extremities.   Back- no cva tenderness.       Assessment & Plan:   Patient Instructions  Your appear to have a urinary  tract infection. I am prescribing  keflex antibiotic for the probable infection. Hydrate well. I am sending out a urine culture. During the interim if your signs and symptoms worsen rather than improving please notify us. We will notify your when the culture results are back.  Refilling mobic for occasional rt hip pain.  Follow up in 7 days or as needed.       Esperanza RichtersEdward Jeiry Birnbaum, PA-C

## 2022-12-05 ENCOUNTER — Other Ambulatory Visit (HOSPITAL_BASED_OUTPATIENT_CLINIC_OR_DEPARTMENT_OTHER): Payer: Self-pay

## 2022-12-05 LAB — URINE CULTURE
MICRO NUMBER:: 14794573
SPECIMEN QUALITY:: ADEQUATE

## 2022-12-05 MED ORDER — NITROFURANTOIN MONOHYD MACRO 100 MG PO CAPS
100.0000 mg | ORAL_CAPSULE | Freq: Two times a day (BID) | ORAL | 0 refills | Status: DC
  Filled 2022-12-05: qty 14, 7d supply, fill #0

## 2022-12-05 NOTE — Addendum Note (Signed)
Addended by: Gwenevere Abbot on: 12/05/2022 02:40 PM   Modules accepted: Orders

## 2022-12-18 ENCOUNTER — Ambulatory Visit
Admission: RE | Admit: 2022-12-18 | Discharge: 2022-12-18 | Disposition: A | Discharge status: Home / Self Care | Payer: Commercial Managed Care - PPO | Source: Ambulatory Visit | Attending: Neurology | Admitting: Neurology

## 2022-12-18 DIAGNOSIS — Z79899 Other long term (current) drug therapy: Secondary | ICD-10-CM

## 2022-12-18 DIAGNOSIS — G35 Multiple sclerosis: Secondary | ICD-10-CM | POA: Diagnosis not present

## 2022-12-18 DIAGNOSIS — G35D Multiple sclerosis, unspecified: Secondary | ICD-10-CM

## 2022-12-18 MED ORDER — GADOPICLENOL 0.5 MMOL/ML IV SOLN
7.0000 mL | Freq: Once | INTRAVENOUS | Status: AC | PRN
  Administered 2022-12-18: 7 mL via INTRAVENOUS

## 2023-01-01 ENCOUNTER — Other Ambulatory Visit: Payer: Self-pay | Admitting: Neurology

## 2023-01-02 ENCOUNTER — Other Ambulatory Visit: Payer: Self-pay

## 2023-01-02 ENCOUNTER — Other Ambulatory Visit: Payer: Self-pay | Admitting: *Deleted

## 2023-01-02 ENCOUNTER — Other Ambulatory Visit (HOSPITAL_COMMUNITY): Payer: Self-pay

## 2023-01-02 ENCOUNTER — Encounter: Payer: Self-pay | Admitting: Neurology

## 2023-01-02 ENCOUNTER — Other Ambulatory Visit (HOSPITAL_BASED_OUTPATIENT_CLINIC_OR_DEPARTMENT_OTHER): Payer: Self-pay

## 2023-01-02 MED ORDER — VUMERITY 231 MG PO CPDR
DELAYED_RELEASE_CAPSULE | ORAL | 11 refills | Status: DC
  Filled 2023-01-02: qty 120, fill #0

## 2023-01-02 MED ORDER — AMPHETAMINE-DEXTROAMPHETAMINE 10 MG PO TABS
10.0000 mg | ORAL_TABLET | Freq: Two times a day (BID) | ORAL | 0 refills | Status: DC
  Filled 2023-01-02: qty 60, 30d supply, fill #0

## 2023-01-02 NOTE — Telephone Encounter (Signed)
Last seen on 11/06/22 " Continue Adderall 10 mg po bid for MS related ADD"  Last filled on 11/12/22 #60 tablets (30 days)  Follow up scheduled on 05/22/23    Rx pending

## 2023-01-03 ENCOUNTER — Other Ambulatory Visit (HOSPITAL_COMMUNITY): Payer: Self-pay

## 2023-01-03 NOTE — Telephone Encounter (Signed)
Received a staff message from [place] w/ [facility]. She stated they are unable to get Vumerity. Medication will need to be sent to another pharmacy.

## 2023-01-29 ENCOUNTER — Other Ambulatory Visit (HOSPITAL_BASED_OUTPATIENT_CLINIC_OR_DEPARTMENT_OTHER): Payer: Self-pay

## 2023-01-29 ENCOUNTER — Encounter: Payer: Self-pay | Admitting: Medical

## 2023-01-29 MED ORDER — ONDANSETRON HCL 4 MG PO TABS
4.0000 mg | ORAL_TABLET | Freq: Three times a day (TID) | ORAL | 0 refills | Status: DC | PRN
  Filled 2023-01-29: qty 20, 7d supply, fill #0

## 2023-02-06 ENCOUNTER — Other Ambulatory Visit: Payer: Self-pay | Admitting: Medical

## 2023-02-06 ENCOUNTER — Other Ambulatory Visit: Payer: Self-pay | Admitting: Neurology

## 2023-02-06 NOTE — Telephone Encounter (Signed)
Requesting: lorazepam 0.5mg   Contract: None UDS: None Last Visit: 12/03/22 Next Visit: None Last Refill: 08/31/22 #4 and 0RF   Please Advise

## 2023-02-07 ENCOUNTER — Other Ambulatory Visit (HOSPITAL_BASED_OUTPATIENT_CLINIC_OR_DEPARTMENT_OTHER): Payer: Self-pay

## 2023-02-07 ENCOUNTER — Other Ambulatory Visit: Payer: Self-pay

## 2023-02-07 MED ORDER — AMPHETAMINE-DEXTROAMPHETAMINE 10 MG PO TABS
10.0000 mg | ORAL_TABLET | Freq: Two times a day (BID) | ORAL | 0 refills | Status: DC
  Filled 2023-02-07: qty 60, 30d supply, fill #0

## 2023-02-07 MED ORDER — LORAZEPAM 0.5 MG PO TABS
0.5000 mg | ORAL_TABLET | Freq: Three times a day (TID) | ORAL | 0 refills | Status: DC | PRN
  Filled 2023-02-07: qty 4, 2d supply, fill #0

## 2023-02-07 NOTE — Telephone Encounter (Signed)
Rx refill 4 tab ativan rx.

## 2023-02-07 NOTE — Telephone Encounter (Signed)
Requested Prescriptions   Pending Prescriptions Disp Refills   amphetamine-dextroamphetamine (ADDERALL) 10 MG tablet 60 tablet 0    Sig: Take 1 tablet (10 mg total) by mouth every morning and then take 1 tablet (10 mg total) by mouth 4 hours later.   Last seen 11/06/22, next appt scheduled for 05/22/23 Dispenses   Dispensed Days Supply Quantity Provider Pharmacy  amphetamine-dextroamphetamine (ADDERALL) 10 MG tablet 01/04/2023 30 60 tablet Sater, Pearletha Furl, MD [facility] -...  amphetamine-dextroamphetamine (ADDERALL) 10 MG tablet 11/12/2022 30 60 tablet Sater, Pearletha Furl, MD [facility] -...  amphetamine-dextroamphetamine (ADDERALL) 10 MG tablet 08/15/2022 30 60 tablet Sater, Pearletha Furl, MD [facility] -...  amphetamine-dextroamphetamine (ADDERALL) 10 MG tablet 04/25/2022 30 60 tablet Sater, Pearletha Furl, MD [facility] -.Marland KitchenMarland Kitchen

## 2023-02-08 ENCOUNTER — Other Ambulatory Visit: Payer: Self-pay | Admitting: Medical

## 2023-02-08 ENCOUNTER — Other Ambulatory Visit (HOSPITAL_BASED_OUTPATIENT_CLINIC_OR_DEPARTMENT_OTHER): Payer: Self-pay

## 2023-02-08 ENCOUNTER — Other Ambulatory Visit: Payer: Self-pay

## 2023-02-08 MED ORDER — MELOXICAM 7.5 MG PO TABS
7.5000 mg | ORAL_TABLET | Freq: Every day | ORAL | 0 refills | Status: DC
  Filled 2023-02-08: qty 30, 30d supply, fill #0

## 2023-02-27 ENCOUNTER — Other Ambulatory Visit (HOSPITAL_COMMUNITY): Payer: Self-pay

## 2023-03-05 ENCOUNTER — Other Ambulatory Visit: Payer: Self-pay

## 2023-03-05 MED ORDER — VUMERITY 231 MG PO CPDR: DELAYED_RELEASE_CAPSULE | ORAL | 11 refills | Status: DC

## 2023-03-06 ENCOUNTER — Other Ambulatory Visit: Payer: Self-pay | Admitting: *Deleted

## 2023-03-06 MED ORDER — VUMERITY 231 MG PO CPDR: 231.0000 mg | DELAYED_RELEASE_CAPSULE | Freq: Two times a day (BID) | ORAL | 0 refills | Status: AC

## 2023-04-08 ENCOUNTER — Ambulatory Visit: Payer: Commercial Managed Care - PPO | Admitting: Medical

## 2023-04-08 ENCOUNTER — Ambulatory Visit
Admission: EM | Admit: 2023-04-08 | Discharge: 2023-04-08 | Disposition: A | Discharge status: Home / Self Care | Payer: Commercial Managed Care - PPO | Source: Home / Self Care

## 2023-04-08 ENCOUNTER — Other Ambulatory Visit: Payer: Self-pay

## 2023-04-08 DIAGNOSIS — Z87440 Personal history of urinary (tract) infections: Secondary | ICD-10-CM | POA: Diagnosis not present

## 2023-04-08 DIAGNOSIS — R300 Dysuria: Secondary | ICD-10-CM | POA: Diagnosis present

## 2023-04-08 DIAGNOSIS — N390 Urinary tract infection, site not specified: Secondary | ICD-10-CM

## 2023-04-08 DIAGNOSIS — G35 Multiple sclerosis: Secondary | ICD-10-CM | POA: Diagnosis not present

## 2023-04-08 DIAGNOSIS — R8290 Unspecified abnormal findings in urine: Secondary | ICD-10-CM | POA: Diagnosis not present

## 2023-04-08 LAB — POCT URINALYSIS DIP (MANUAL ENTRY)
Bilirubin, UA: NEGATIVE
Blood, UA: NEGATIVE
Glucose, UA: NEGATIVE mg/dL
Nitrite, UA: NEGATIVE
Protein Ur, POC: NEGATIVE mg/dL
Spec Grav, UA: 1.02 (ref 1.010–1.025)
Urobilinogen, UA: 1 E.U./dL
pH, UA: 7 (ref 5.0–8.0)

## 2023-04-08 MED ORDER — ONDANSETRON HCL 4 MG PO TABS
4.0000 mg | ORAL_TABLET | Freq: Three times a day (TID) | ORAL | 0 refills | Status: AC | PRN
  Filled 2023-04-08: qty 30, 5d supply, fill #0

## 2023-04-08 MED ORDER — NITROFURANTOIN MONOHYD MACRO 100 MG PO CAPS
100.0000 mg | ORAL_CAPSULE | Freq: Two times a day (BID) | ORAL | 0 refills | Status: DC
  Filled 2023-04-08: qty 10, 5d supply, fill #0

## 2023-04-08 NOTE — ED Provider Notes (Signed)
[facility]    CSN: [id number] Arrival date & time: 04/08/23  1735      History   Chief Complaint No chief complaint on file.   HPI Karen Powers is a 42 y.o. female.   Patient is a Engineer, civil (consulting).  States Karen Powers is prone to recurrent UTI.  Currently has symptoms Karen Powers associates with a UTI including malodorous urine.  Karen Powers has some discomfort with urination.  No fever or chills, no flank pain no vaginal discharge    Past Medical History:  Diagnosis Date   Acute blood loss anemia 01/06/2016   Anemia    Diabetes mellitus without complication (HCC) 2015   Gestational diabetes only.   Dysrhythmia    Gallstones    Gestational diabetes    2nd preg only   Headache(784.0)    otc med prn   Heartburn in pregnancy    IBS (irritable bowel syndrome)    Maternal iron deficiency anemia 01/06/2016   MS (multiple sclerosis) (HCC)    Postpartum care following cesarean delivery (5/11) 01/05/2016   Postpartum hypertension 03/07/2019   POTS (postural orthostatic tachycardia syndrome)    SVT (supraventricular tachycardia)    No problems since 10/2012 - no med needed   Vaginal Pap smear, abnormal    Vitamin deficiency     Patient Active Problem List   Diagnosis Date Noted   Acute cystitis without hematuria 02/21/2022   Numbness 11/07/2021   High risk medication use 11/07/2021   Chronic left hip pain 03/22/2021   Acute calculous cholecystitis 08/12/2019   Postpartum hypertension 03/07/2019   Maternal anemia, with delivery 02/27/2019   Rh negative, maternal 02/27/2019   Previous cesarean delivery affecting pregnancy 02/25/2019   Multiple sclerosis exacerbation (HCC) 10/22/2017   Acute hypokalemia 10/22/2017   POTS (postural orthostatic tachycardia syndrome) 10/22/2017   SVT (supraventricular tachycardia) 10/22/2017   History of Gestational diabetes 10/22/2017   Maternal iron deficiency anemia 01/06/2016   Acute blood loss anemia 01/06/2016   Postpartum care following cesarean  delivery (7/1) 01/05/2016   Wellness examination 09/28/2014   Acute pharyngitis 09/08/2014   Previous cesarean section 12/19/2013   Multiple sclerosis (HCC) 09/07/2011   Migraine 09/07/2011   Paroxysmal SVT (supraventricular tachycardia) 09/07/2011   UTI (urinary tract infection) 09/07/2011    Past Surgical History:  Procedure Laterality Date   ATRIAL ABLATION SURGERY  2003   FAILED   CESAREAN SECTION  2006   Breech   CESAREAN SECTION  2006   breech   CESAREAN SECTION N/A 12/19/2013   Procedure: REPEAT CESAREAN SECTION;  Surgeon: Lenoard Aden, MD;  Location: [facility];  Service: Obstetrics;  Laterality: N/A;  EDD: 12/25/13   CESAREAN SECTION N/A 01/05/2016   Procedure: Repeat CESAREAN SECTION;  Surgeon: Olivia Mackie, MD;  Location: [facility];  Service: Obstetrics;  Laterality: N/A;  EDD: 01/12/16    CESAREAN SECTION WITH BILATERAL TUBAL LIGATION Bilateral 02/25/2019   Procedure: Repeat CESAREAN SECTION WITH BILATERAL TUBAL LIGATION;  Surgeon: Olivia Mackie, MD;  Location: [facility];  Service: Obstetrics;  Laterality: Bilateral;  Heather, RNFA  EDD: 03/04/19 Allergy: Adhesive, Paxil, Cipro   CHOLECYSTECTOMY N/A 08/13/2019   Procedure: LAPAROSCOPIC CHOLECYSTECTOMY WITH  CHOLANGIOGRAM;  Surgeon: Gaynelle Adu, MD;  Location: [facility];  Service: General;  Laterality: N/A;   COLPOSCOPY     WISDOM TOOTH EXTRACTION      OB History     Gravida  6   Para  4   Term  4   Preterm  AB  2   Living  4      SAB  2   IAB      Ectopic      Multiple  0   Live Births  4            Home Medications    Prior to Admission medications   Medication Sig Start Date End Date Taking? Authorizing Provider  nitrofurantoin, macrocrystal-monohydrate, (MACROBID) 100 MG capsule Take 1 capsule (100 mg total) by mouth 2 (two) times daily. 04/08/23  Yes Eustace Moore, MD  acetaminophen (TYLENOL) 500 MG tablet Take 1,000 mg by mouth every 6 (six) hours as needed for moderate  pain or headache.    [provider]  amphetamine-dextroamphetamine (ADDERALL) 10 MG tablet Take 1 tablet (10 mg total) by mouth every morning and then take 1 tablet (10 mg total) by mouth 4 hours later. 02/07/23   Sater, Pearletha Furl, MD  Diroximel Fumarate (VUMERITY) 231 MG CPDR Take 231 mg by mouth in the morning and at bedtime. 03/06/23   Sater, Pearletha Furl, MD  ibuprofen (ADVIL) 200 MG tablet Take 800 mg by mouth every 6 (six) hours as needed for headache or mild pain.    [provider]  LORazepam (ATIVAN) 0.5 MG tablet Take 1 tablet (0.5 mg total) by mouth every 8 (eight) hours as needed for anxiety. 02/07/23   Saguier, Ramon Dredge, PA-C  meloxicam (MOBIC) 7.5 MG tablet Take 1 tablet (7.5 mg total) by mouth daily. 02/08/23   Saguier, Ramon Dredge, PA-C  ondansetron (ZOFRAN) 4 MG tablet Take 1-2 tablets (4-8 mg total) by mouth every 8 (eight) hours as needed for nausea or vomiting. 04/08/23   Eustace Moore, MD  traZODone (DESYREL) 50 MG tablet Take 1 tablet (50 mg total) by mouth at bedtime. 11/06/22   Sater, Pearletha Furl, MD  VUMERITY 231 MG CPDR TAKE 2 CAPSULES (462MG ) BY MOUTH TWICE DAILY 03/05/23   Sater, Pearletha Furl, MD    Family History Family History  Problem Relation Age of Onset   Multiple sclerosis Mother    Migraines Mother    Emphysema Father    Heart disease Father    Migraines Father    Bladder Cancer Father    Lung cancer Father    Migraines Sister    Spina bifida Brother        half brother   Celiac disease Brother    Diabetes Maternal Grandmother    Colon cancer Maternal Grandmother    Cancer Paternal Grandfather        LUNG   Esophageal cancer Neg Hx    Stomach cancer Neg Hx     Social History Social History   Tobacco Use   Smoking status: Never   Smokeless tobacco: Never  Vaping Use   Vaping status: Never Used  Substance Use Topics   Alcohol use: No   Drug use: No     Allergies   Ciprofloxacin   Review of Systems Review of Systems See  HPI  Physical Exam Triage Vital Signs ED Triage Vitals [04/08/23 1741]  Encounter Vitals Group     BP 127/86     Systolic BP Percentile      Diastolic BP Percentile      Pulse Rate 85     Resp 16     Temp 98.4 F (36.9 C)     Temp Source Oral     SpO2 98 %     Weight  Height      Head Circumference      Peak Flow      Pain Score 3     Pain Loc      Pain Education      Exclude from Growth Chart    No data found.  Updated Vital Signs BP 127/86 (BP Location: Right Arm)   Pulse 85   Temp 98.4 F (36.9 C) (Oral)   Resp 16   SpO2 98%        Physical Exam Constitutional:      General: Karen Powers is not in acute distress.    Appearance: Karen Powers is well-developed.  HENT:     Head: Normocephalic and atraumatic.  Eyes:     Conjunctiva/sclera: Conjunctivae normal.     Pupils: Pupils are equal, round, and reactive to light.  Cardiovascular:     Rate and Rhythm: Normal rate.  Pulmonary:     Effort: Pulmonary effort is normal. No respiratory distress.  Musculoskeletal:        General: Normal range of motion.     Cervical back: Normal range of motion.  Skin:    General: Skin is warm and dry.  Neurological:     Mental Status: Karen Powers is alert.      UC Treatments / Results  Labs (all labs ordered are listed, but only abnormal results are displayed) Labs Reviewed  POCT URINALYSIS DIP (MANUAL ENTRY) - Abnormal; Notable for the following components:      Result Value   Ketones, POC UA moderate (40) (*)    Leukocytes, UA Trace (*)    All other components within normal limits  URINE CULTURE    EKG   Radiology No results found.  Procedures Procedures (including critical care time)  Medications Ordered in UC Medications - No data to display  Initial Impression / Assessment and Plan / UC Course  I have reviewed the triage vital signs and the nursing notes.  Pertinent labs & imaging results that were available during my care of the patient were reviewed by me and  considered in my medical decision making (see chart for details).      Final Clinical Impressions(s) / UC Diagnoses   Final diagnoses:  Abnormal urinalysis  Recurrent urinary tract infection     Discharge Instructions      I have prescribed Macrodantin.  Take this 2 times a day Take Zofran if needed Drink lots of water  Check MyChart for your urine culture report    ED Prescriptions     Medication Sig Dispense Auth. Provider   nitrofurantoin, macrocrystal-monohydrate, (MACROBID) 100 MG capsule Take 1 capsule (100 mg total) by mouth 2 (two) times daily. 10 capsule Eustace Moore, MD   ondansetron (ZOFRAN) 4 MG tablet Take 1-2 tablets (4-8 mg total) by mouth every 8 (eight) hours as needed for nausea or vomiting. 30 tablet Eustace Moore, MD      PDMP not reviewed this encounter.   Eustace Moore, MD 04/08/23 [phone]

## 2023-04-08 NOTE — Discharge Instructions (Signed)
I have prescribed Macrodantin.  Take this 2 times a day Take Zofran if needed Drink lots of water  Check MyChart for your urine culture report

## 2023-04-08 NOTE — ED Triage Notes (Signed)
Malodorous urine x few weeks, now with discomfort while urinating. Gets frequent UTIs d/t MS.

## 2023-04-09 ENCOUNTER — Other Ambulatory Visit (HOSPITAL_BASED_OUTPATIENT_CLINIC_OR_DEPARTMENT_OTHER): Payer: Self-pay

## 2023-04-11 ENCOUNTER — Encounter (INDEPENDENT_AMBULATORY_CARE_PROVIDER_SITE_OTHER): Payer: Self-pay

## 2023-04-15 ENCOUNTER — Telehealth: Payer: Self-pay | Admitting: Medical

## 2023-04-15 NOTE — Telephone Encounter (Signed)
Pt called to see about getting an earlier CPE as she is trying to make sure her premium doesn't go up. Pt asked if another provider might be able to facilitate her CPE if Ramon Dredge was unavailable.

## 2023-04-17 NOTE — Telephone Encounter (Signed)
Okay for CPE sooner

## 2023-04-19 ENCOUNTER — Encounter: Payer: Commercial Managed Care - PPO | Admitting: Medical

## 2023-04-24 ENCOUNTER — Other Ambulatory Visit: Payer: Self-pay

## 2023-04-24 ENCOUNTER — Ambulatory Visit (INDEPENDENT_AMBULATORY_CARE_PROVIDER_SITE_OTHER): Payer: Commercial Managed Care - PPO | Admitting: Medical

## 2023-04-24 VITALS — BP 115/71 | HR 68 | Temp 98.8°F | Resp 18 | Ht 70.0 in | Wt 148.4 lb

## 2023-04-24 DIAGNOSIS — Z0000 Encounter for general adult medical examination without abnormal findings: Secondary | ICD-10-CM

## 2023-04-24 LAB — CBC WITH DIFFERENTIAL/PLATELET
Basophils Absolute: 0 10*3/uL (ref 0.0–0.1)
Basophils Relative: 0.7 % (ref 0.0–3.0)
Eosinophils Absolute: 0.1 10*3/uL (ref 0.0–0.7)
Eosinophils Relative: 2.6 % (ref 0.0–5.0)
HCT: 33.5 % — ABNORMAL LOW (ref 36.0–46.0)
Hemoglobin: 11.1 g/dL — ABNORMAL LOW (ref 12.0–15.0)
Lymphocytes Relative: 30.8 % (ref 12.0–46.0)
Lymphs Abs: 1.4 10*3/uL (ref 0.7–4.0)
MCHC: 33.2 g/dL (ref 30.0–36.0)
MCV: 89.4 fl (ref 78.0–100.0)
Monocytes Absolute: 0.4 10*3/uL (ref 0.1–1.0)
Monocytes Relative: 8.8 % (ref 3.0–12.0)
Neutro Abs: 2.6 10*3/uL (ref 1.4–7.7)
Neutrophils Relative %: 57.1 % (ref 43.0–77.0)
Platelets: 187 10*3/uL (ref 150.0–400.0)
RBC: 3.74 Mil/uL — ABNORMAL LOW (ref 3.87–5.11)
RDW: 13.6 % (ref 11.5–15.5)
WBC: 4.5 10*3/uL (ref 4.0–10.5)

## 2023-04-24 LAB — LIPID PANEL
Cholesterol: 149 mg/dL (ref 0–200)
HDL: 58 mg/dL (ref >39.00)
LDL Cholesterol: 83 mg/dL (ref 0–99)
NonHDL: 90.61
Total CHOL/HDL Ratio: 3
Triglycerides: 39 mg/dL (ref 0.0–149.0)
VLDL: 7.8 mg/dL (ref 0.0–40.0)

## 2023-04-24 LAB — COMPREHENSIVE METABOLIC PANEL
ALT: 10 U/L (ref 0–35)
AST: 12 U/L (ref 0–37)
Albumin: 4.1 g/dL (ref 3.5–5.2)
Alkaline Phosphatase: 43 U/L (ref 39–117)
BUN: 7 mg/dL (ref 6–23)
CO2: 28 mEq/L (ref 19–32)
Calcium: 9 mg/dL (ref 8.4–10.5)
Chloride: 105 mEq/L (ref 96–112)
Creatinine, Ser: 0.67 mg/dL (ref 0.40–1.20)
GFR: 108.2 mL/min (ref >60.00)
Glucose, Bld: 95 mg/dL (ref 70–99)
Potassium: 4.7 mEq/L (ref 3.5–5.1)
Sodium: 139 mEq/L (ref 135–145)
Total Bilirubin: 0.7 mg/dL (ref 0.2–1.2)
Total Protein: 6.3 g/dL (ref 6.0–8.3)

## 2023-04-24 NOTE — Progress Notes (Signed)
Subjective:    Patient ID: Karen Powers, female    DOB: 03/17/1981, 42 y.o.   MRN: [id number]  HPI  Pt in for wellness.   Pt is fasting. Works as Engineer, civil (consulting). Pt not exercising. Pt weight has been stable. Admits to not eating very healthy. About 4 sodas in a week. No alcohol and non smoker.  Will get flu vaccine thru work. Pt states got covid beginning of August was like a cold.  Pt is due to schedule for pap. She will make that appointment. Pt will get her annual mammogram also. She states will just get scheduled when get pap.  Review of Systems  Constitutional:  Negative for chills, fatigue and fever.  Respiratory:  Negative for cough, chest tightness, shortness of breath and wheezing.   Cardiovascular:  Negative for chest pain and palpitations.  Gastrointestinal:  Negative for abdominal pain, blood in stool, diarrhea, nausea and vomiting.  Genitourinary:  Negative for dysuria, frequency and hematuria.  Musculoskeletal:  Negative for back pain, joint swelling, myalgias and neck stiffness.  Skin:  Negative for rash.  Neurological:  Negative for dizziness, speech difficulty and light-headedness.  Hematological:  Negative for adenopathy. Does not bruise/bleed easily.  Psychiatric/Behavioral:  Negative for behavioral problems and confusion. The patient is not nervous/anxious.     Past Medical History:  Diagnosis Date   Acute blood loss anemia 01/06/2016   Anemia    Diabetes mellitus without complication (HCC) 2015   Gestational diabetes only.   Dysrhythmia    Gallstones    Gestational diabetes    2nd preg only   Headache(784.0)    otc med prn   Heartburn in pregnancy    IBS (irritable bowel syndrome)    Maternal iron deficiency anemia 01/06/2016   MS (multiple sclerosis) (HCC)    Postpartum care following cesarean delivery (5/11) 01/05/2016   Postpartum hypertension 03/07/2019   POTS (postural orthostatic tachycardia syndrome)    SVT (supraventricular tachycardia)    No  problems since 10/2012 - no med needed   Vaginal Pap smear, abnormal    Vitamin deficiency      Social History   Socioeconomic History   Marital status: Married    Spouse name: Karen Powers   Number of children: 4   Years of education: Not on file   Highest education level: Not on file  Occupational History   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   Occupation: RN  Tobacco Use   Smoking status: Never   Smokeless tobacco: Never  Vaping Use   Vaping status: Never Used  Substance and Sexual Activity   Alcohol use: No   Drug use: No   Sexual activity: Yes    Birth control/protection: Pill    Comment: pregnant  Other Topics Concern   Not on file  Social History Narrative   Soda daily   Right handed    Lives with husband and 4 kids   She is ER Engineer, civil (consulting)       Social Determinants of Health   Financial Resource Strain: Not on file  Food Insecurity: Not on file  Transportation Needs: Not on file  Physical Activity: Not on file  Stress: Not on file  Social Connections: Not on file  Intimate Partner Violence: Not on file    Past Surgical History:  Procedure Laterality Date   ATRIAL ABLATION SURGERY  2003   FAILED   CESAREAN SECTION  2006   Breech   CESAREAN SECTION  2006   breech  CESAREAN SECTION N/A 12/19/2013   Procedure: REPEAT CESAREAN SECTION;  Surgeon: Lenoard Aden, MD;  Location: [facility];  Service: Obstetrics;  Laterality: N/A;  EDD: 12/25/13   CESAREAN SECTION N/A 01/05/2016   Procedure: Repeat CESAREAN SECTION;  Surgeon: Karen Mackie, MD;  Location: [facility];  Service: Obstetrics;  Laterality: N/A;  EDD: 01/12/16    CESAREAN SECTION WITH BILATERAL TUBAL LIGATION Bilateral 02/25/2019   Procedure: Repeat CESAREAN SECTION WITH BILATERAL TUBAL LIGATION;  Surgeon: Karen Mackie, MD;  Location: [facility];  Service: Obstetrics;  Laterality: Bilateral;  Heather, RNFA  EDD: 03/04/19 Allergy: Adhesive, Paxil, Cipro   CHOLECYSTECTOMY N/A 08/13/2019   Procedure:  LAPAROSCOPIC CHOLECYSTECTOMY WITH  CHOLANGIOGRAM;  Surgeon: Karen Adu, MD;  Location: [facility];  Service: General;  Laterality: N/A;   COLPOSCOPY     WISDOM TOOTH EXTRACTION      Family History  Problem Relation Age of Onset   Multiple sclerosis Mother    Migraines Mother    Emphysema Father    Heart disease Father    Migraines Father    Bladder Cancer Father    Lung cancer Father    Migraines Sister    Spina bifida Brother        half brother   Celiac disease Brother    Diabetes Maternal Grandmother    Colon cancer Maternal Grandmother    Cancer Paternal Grandfather        LUNG   Esophageal cancer Neg Hx    Stomach cancer Neg Hx     Allergies  Allergen Reactions   Ciprofloxacin Rash and Other (See Comments)    Has patient had a PCN reaction causing immediate rash, facial/tongue/throat swelling, SOB or lightheadedness with hypotension: No Has patient had a PCN reaction causing severe rash involving mucus membranes or skin necrosis: No Has patient had a PCN reaction that required hospitalization No Has patient had a PCN reaction occurring within the last 10 years: Yes If all of the above answers are "NO", then may proceed with Cephalosporin use.    Current Outpatient Medications on File Prior to Visit  Medication Sig Dispense Refill   acetaminophen (TYLENOL) 500 MG tablet Take 1,000 mg by mouth every 6 (six) hours as needed for moderate pain or headache.     amphetamine-dextroamphetamine (ADDERALL) 10 MG tablet Take 1 tablet (10 mg total) by mouth every morning and then take 1 tablet (10 mg total) by mouth 4 hours later. 60 tablet 0   Diroximel Fumarate (VUMERITY) 231 MG CPDR Take 231 mg by mouth in the morning and at bedtime. 60 capsule 0   ibuprofen (ADVIL) 200 MG tablet Take 800 mg by mouth every 6 (six) hours as needed for headache or mild pain.     LORazepam (ATIVAN) 0.5 MG tablet Take 1 tablet (0.5 mg total) by mouth every 8 (eight) hours as needed for anxiety. 4  tablet 0   meloxicam (MOBIC) 7.5 MG tablet Take 1 tablet (7.5 mg total) by mouth daily. 30 tablet 0   nitrofurantoin, macrocrystal-monohydrate, (MACROBID) 100 MG capsule Take 1 capsule (100 mg total) by mouth 2 (two) times daily. 10 capsule 0   ondansetron (ZOFRAN) 4 MG tablet Take 1-2 tablets (4-8 mg total) by mouth every 8 (eight) hours as needed for nausea or vomiting. 30 tablet 0   traZODone (DESYREL) 50 MG tablet Take 1 tablet (50 mg total) by mouth at bedtime. 30 tablet 11   VUMERITY 231 MG CPDR TAKE 2 CAPSULES (462MG ) BY  MOUTH TWICE DAILY 120 capsule 11   No current facility-administered medications on file prior to visit.    BP 115/71   Pulse 68   Temp 98.8 F (37.1 C)   Resp 18   Ht 5\' 10"  (1.778 m)   Wt 148 lb 6.4 oz (67.3 kg)   SpO2 100%   BMI 21.29 kg/m        Objective:   Physical Exam  General Mental Status- Alert. General Appearance- Not in acute distress.   Skin General: Color- Normal Color. Moisture- Normal Moisture.  Neck Carotid Arteries- Normal color. Moisture- Normal Moisture. No carotid bruits. No JVD.  Chest and Lung Exam Auscultation: Breath Sounds:-Normal.  Cardiovascular Auscultation:Rythm- Regular. Murmurs & Other Heart Sounds:Auscultation of the heart reveals- No Murmurs.  Abdomen Inspection:-Inspeection Normal. Palpation/Percussion:Note:No mass. Palpation and Percussion of the abdomen reveal- Non Tender, Non Distended + BS, no rebound or guarding.  Neurologic Cranial Nerve exam:- CN III-XII intact(No nystagmus), symmetric smile. Drift Test:- No drift. Finger to Nose:- Normal/Intact Strength:- 5/5 equal and symmetric strength both upper and lower extremities.       Assessment & Plan:   Patient Instructions  For you wellness exam today I have ordered cbc, cmp and lipid panel.  Flu vaccine thru work.  Pap and mammogram upcoming thru gyn office.  Recommend exercise and healthy diet.  We will let you know lab results as they  come in.  Follow up date appointment will be determined after lab review.      Esperanza Richters, PA-C

## 2023-04-24 NOTE — Patient Instructions (Addendum)
For you wellness exam today I have ordered cbc, cmp and lipid panel.  Flu vaccine thru work.  Pap and mammogram upcoming thru gyn office.  Recommend exercise and healthy diet.  We will let you know lab results as they come in.  Follow up date appointment will be determined after lab review.      Preventive Care 42-42 Years Old, Female Preventive care refers to lifestyle choices and visits with your health care provider that can promote health and wellness. Preventive care visits are also called wellness exams. What can I expect for my preventive care visit? Counseling Your health care provider may ask you questions about your: Medical history, including: Past medical problems. Family medical history. Pregnancy history. Current health, including: Menstrual cycle. Method of birth control. Emotional well-being. Home life and relationship well-being. Sexual activity and sexual health. Lifestyle, including: Alcohol, nicotine or tobacco, and drug use. Access to firearms. Diet, exercise, and sleep habits. Work and work Astronomer. Sunscreen use. Safety issues such as seatbelt and bike helmet use. Physical exam Your health care provider will check your: Height and weight. These may be used to calculate your BMI (body mass index). BMI is a measurement that tells if you are at a healthy weight. Waist circumference. This measures the distance around your waistline. This measurement also tells if you are at a healthy weight and may help predict your risk of certain diseases, such as type 2 diabetes and high blood pressure. Heart rate and blood pressure. Body temperature. Skin for abnormal spots. What immunizations do I need?  Vaccines are usually given at various ages, according to a schedule. Your health care provider will recommend vaccines for you based on your age, medical history, and lifestyle or other factors, such as travel or where you work. What tests do I  need? Screening Your health care provider may recommend screening tests for certain conditions. This may include: Lipid and cholesterol levels. Diabetes screening. This is done by checking your blood sugar (glucose) after you have not eaten for a while (fasting). Pelvic exam and Pap test. Hepatitis B test. Hepatitis C test. HIV (human immunodeficiency virus) test. STI (sexually transmitted infection) testing, if you are at risk. Lung cancer screening. Colorectal cancer screening. Mammogram. Talk with your health care provider about when you should start having regular mammograms. This may depend on whether you have a family history of breast cancer. BRCA-related cancer screening. This may be done if you have a family history of breast, ovarian, tubal, or peritoneal cancers. Bone density scan. This is done to screen for osteoporosis. Talk with your health care provider about your test results, treatment options, and if necessary, the need for more tests. Follow these instructions at home: Eating and drinking  Eat a diet that includes fresh fruits and vegetables, whole grains, lean protein, and low-fat dairy products. Take vitamin and mineral supplements as recommended by your health care provider. Do not drink alcohol if: Your health care provider tells you not to drink. You are pregnant, may be pregnant, or are planning to become pregnant. If you drink alcohol: Limit how much you have to 0-1 drink a day. Know how much alcohol is in your drink. In the [place] one drink equals one 12 oz bottle of beer (355 mL), one 5 oz glass of wine (148 mL), or one 1 oz glass of hard liquor (44 mL). Lifestyle Brush your teeth every morning and night with fluoride toothpaste. Floss one time each day. Exercise for at least 30  minutes 5 or more days each week. Do not use any products that contain nicotine or tobacco. These products include cigarettes, chewing tobacco, and vaping devices, such as  e-cigarettes. If you need help quitting, ask your health care provider. Do not use drugs. If you are sexually active, practice safe sex. Use a condom or other form of protection to prevent STIs. If you do not wish to become pregnant, use a form of birth control. If you plan to become pregnant, see your health care provider for a prepregnancy visit. Take aspirin only as told by your health care provider. Make sure that you understand how much to take and what form to take. Work with your health care provider to find out whether it is safe and beneficial for you to take aspirin daily. Find healthy ways to manage stress, such as: Meditation, yoga, or listening to music. Journaling. Talking to a trusted person. Spending time with friends and family. Minimize exposure to UV radiation to reduce your risk of skin cancer. Safety Always wear your seat belt while driving or riding in a vehicle. Do not drive: If you have been drinking alcohol. Do not ride with someone who has been drinking. When you are tired or distracted. While texting. If you have been using any mind-altering substances or drugs. Wear a helmet and other protective equipment during sports activities. If you have firearms in your house, make sure you follow all gun safety procedures. Seek help if you have been physically or sexually abused. What's next? Visit your health care provider once a year for an annual wellness visit. Ask your health care provider how often you should have your eyes and teeth checked. Stay up to date on all vaccines. This information is not intended to replace advice given to you by your health care provider. Make sure you discuss any questions you have with your health care provider. Document Revised: 02/08/2021 Document Reviewed: 02/08/2021 Elsevier Patient Education  2024 [employer].

## 2023-05-07 ENCOUNTER — Encounter: Payer: Commercial Managed Care - PPO | Admitting: Medical

## 2023-05-22 ENCOUNTER — Other Ambulatory Visit (HOSPITAL_BASED_OUTPATIENT_CLINIC_OR_DEPARTMENT_OTHER): Payer: Self-pay

## 2023-05-22 ENCOUNTER — Ambulatory Visit: Payer: Commercial Managed Care - PPO | Admitting: Neurology

## 2023-05-22 ENCOUNTER — Encounter: Payer: Self-pay | Admitting: Neurology

## 2023-05-22 VITALS — BP 107/71 | HR 81 | Ht 70.0 in | Wt 150.0 lb

## 2023-05-22 DIAGNOSIS — Z79899 Other long term (current) drug therapy: Secondary | ICD-10-CM

## 2023-05-22 DIAGNOSIS — F988 Other specified behavioral and emotional disorders with onset usually occurring in childhood and adolescence: Secondary | ICD-10-CM | POA: Diagnosis not present

## 2023-05-22 DIAGNOSIS — N399 Disorder of urinary system, unspecified: Secondary | ICD-10-CM

## 2023-05-22 DIAGNOSIS — G35 Multiple sclerosis: Secondary | ICD-10-CM | POA: Diagnosis not present

## 2023-05-22 MED ORDER — AMPHETAMINE-DEXTROAMPHETAMINE 10 MG PO TABS
10.0000 mg | ORAL_TABLET | Freq: Two times a day (BID) | ORAL | 0 refills | Status: DC
  Filled 2023-05-22: qty 60, 30d supply, fill #0

## 2023-05-22 NOTE — Progress Notes (Signed)
[facility]  PATIENT: Karen Powers DOB: [birth year]  REFERRING DOCTOR OR PCP: Esperanza Richters, PA-C SOURCE: Patient, notes from neurology, imaging and laboratory reports, MRI images personally reviewed.  _________________________________   HISTORICAL  CHIEF COMPLAINT:  Chief Complaint  Patient presents with   Room 10    Pt is here Alone. Pt states that things have been going good with her MS since her last Appointment. Pt states that she doesn't have any new complaints or concerns to discuss today.     HISTORY OF PRESENT ILLNESS:  Karen Powers is a 42 y.o. woman with multiple sclerosis and treatment options.  Update 05/22/2023 She is doing well with no new neurologic symptoms presently.   She did have 2 days of left leg achy pian > dysesthetic pain..  She is on  Vumerity and tolerates it well.   No exacerbations.   She has rare flushing after a missed dose and rarely has nausea.  Lymphocytes were 1.4 04/24/2023.  MRI 4/232024 showed no new lesions  Gait is doing well.   She does not need to use the bannister on stairs but might on some stairs.  No falls.   No weakness or numbness.     She has no urinary hesitancy but is not sure completely empties. Rarely she had   She has had a few UTIs       Vision is ok though she has trouble focusing on words at time.    Her OD vision is generally worse than OS.  Color vision is symmetric.  She notes some dysphagia and has had an EGD and manometry is being considered.   She sees Dr. Nicole Kindred.  A PPI was started.      She has fatigue and some mild mental fog.    She has difficulty with focus/attention and Adderall helps.  She works nights on weekends and sleep is variable (works nights on weekends).   She has trouble falling asleep.   In the past, she was placed on Seroquel for her insomnia but she had more anxiety at that time.  She has not tried trazodone..     She is a Engineer, civil (consulting).   She denies significant depression but  occasionally feels down.       MS Hstory She is a 42 year old woman who was diagnosed with MS in 2009 at age 77.   In September 2009, she was experiencing numbness in her right leg,   Her mother had MS prompting an evaluation with an MRI showing several periventricular T2 hyperintense foci and a focus in the left cerebral peduncle.    CSF was analyzed and was also consistent with MS,   She was diagnosed by Dr. Orlin Hilding and then started to see Dr. Leotis Shames in [place]   She was started on Copaxone but only stayed on a couple years.   She was starting a family and had several more prognosis over the next 10 years..  She was seen Dr. Renne Crigler at [facility].  In 2018, she had some visual changes and initiation of a disease modifying therapy was recommended though she did not start due to insurance issues., She had additional neurologic symptoms January or February 2019.   She noted that when she would turn her head to the right, she would get a wet sensation in her right leg.    She then had an episode of severe vertigo a week or two later that persisted a couple weeks.  MRI 10/22/2017 showed  several enhancing lesions in the cervical spine and no new lesions in the brain.  She received a few days of IV Solumedrol and was better 1-2 weeks later and symptoms resolved a week or two after that.     She was planning an additional pregnancy so held off a treatment   In December 2022, she felt more off balanced but MRI brain and spine showed no new lesions.   She has since improved and is back at baseline.  She started Vumerity January 2023.  She has 4 children born 2006, 2015, 2017, and 2020.   She is not planning additional children  Imaging:  MRI of the brain 12/18/2022 showed Scattered T2/FLAIR hypertense foci in the cerebral hemispheres in a pattern consistent with chronic Monina plaque associated with multiple sclerosis. None of the foci appeared acute. They did not enhance. Compared to the MRI from 08/04/2021, there were  no new lesions.   MRI of the brain 08/04/2021 showed scattered T2/FLAIR hyperintense foci predominantly in the periventricular white matter.  None of the foci enhance or appear to be acute.  No new lesions compared to 10/22/2017  MRI of the cervical and thoracic spine 08/04/2021 showed subtle T2 hyperintense foci in the upper cervical spine, with improved appearance compared to the 2019 MRI.  No enhancing lesions.  No additional lesions.  The MRI of the thoracic spine did not show any MS lesions.  MRI of the cervical spine 10/22/2017 showed several enhancing lesions in the cervical spine, to the left adjacent to C2, anteriorly adjacent to C2 and to the right adjacent to C2-C3.  An additional nonenhancing lesion is noted to the left adjacent to C4.  None of these foci were present on the MRI from 07/13/2017 (normal spinal cord)  MRI of the brain 10/22/2017 showed scattered T2/FLAIR hyperintense foci in the hemispheres, predominantly in the periventricular white matter.  None of the foci enhance.  No definite new lesions compared to 07/13/2017.  MRI of the brain 07/13/2017 showed scattered T2/FLAIR hyperintense foci.  Dominantly in the periventricular white matter with a subtle focus in the left cerebral peduncle.  There has been mild progression compared to the 10/20/2010 MRI.  MRI of the brain 10/20/2010 and 05/17/2008 showed T2/FLAIR hyperintense foci predominantly in the periventricular white matter.  These 2 studies were stable.  REVIEW OF SYSTEMS: Constitutional: No fevers, chills, sweats, or change in appetite Eyes: No visual changes, double vision, eye pain Ear, nose and throat: No hearing loss, ear pain, nasal congestion, sore throat Cardiovascular: No chest pain, palpitations Respiratory:  No shortness of breath at rest or with exertion.   No wheezes GastrointestinaI: No nausea, vomiting, diarrhea, abdominal pain, fecal incontinence Genitourinary:  No dysuria, urinary retention or frequency.  No  nocturia. Musculoskeletal:  No neck pain, back pain Integumentary: No rash, pruritus, skin lesions Neurological: as above Psychiatric: No depression at this time.  No anxiety Endocrine: No palpitations, diaphoresis, change in appetite, change in weigh or increased thirst Hematologic/Lymphatic:  No anemia, purpura, petechiae. Allergic/Immunologic: No itchy/runny eyes, nasal congestion, recent allergic reactions, rashes  ALLERGIES: Allergies  Allergen Reactions   Ciprofloxacin Rash and Other (See Comments)    Has patient had a PCN reaction causing immediate rash, facial/tongue/throat swelling, SOB or lightheadedness with hypotension: No Has patient had a PCN reaction causing severe rash involving mucus membranes or skin necrosis: No Has patient had a PCN reaction that required hospitalization No Has patient had a PCN reaction occurring within the last 10 years:  Yes If all of the above answers are "NO", then may proceed with Cephalosporin use.    HOME MEDICATIONS:  Current Outpatient Medications:    acetaminophen (TYLENOL) 500 MG tablet, Take 1,000 mg by mouth every 6 (six) hours as needed for moderate pain or headache., Disp: , Rfl:    Diroximel Fumarate (VUMERITY) 231 MG CPDR, Take 231 mg by mouth in the morning and at bedtime., Disp: 60 capsule, Rfl: 0   ibuprofen (ADVIL) 200 MG tablet, Take 800 mg by mouth every 6 (six) hours as needed for headache or mild pain., Disp: , Rfl:    LORazepam (ATIVAN) 0.5 MG tablet, Take 1 tablet (0.5 mg total) by mouth every 8 (eight) hours as needed for anxiety., Disp: 4 tablet, Rfl: 0   meloxicam (MOBIC) 7.5 MG tablet, Take 1 tablet (7.5 mg total) by mouth daily., Disp: 30 tablet, Rfl: 0   nitrofurantoin, macrocrystal-monohydrate, (MACROBID) 100 MG capsule, Take 1 capsule (100 mg total) by mouth 2 (two) times daily., Disp: 10 capsule, Rfl: 0   ondansetron (ZOFRAN) 4 MG tablet, Take 1-2 tablets (4-8 mg total) by mouth every 8 (eight) hours as needed for  nausea or vomiting., Disp: 30 tablet, Rfl: 0   traZODone (DESYREL) 50 MG tablet, Take 1 tablet (50 mg total) by mouth at bedtime., Disp: 30 tablet, Rfl: 11   VUMERITY 231 MG CPDR, TAKE 2 CAPSULES (462MG ) BY MOUTH TWICE DAILY, Disp: 120 capsule, Rfl: 11   amphetamine-dextroamphetamine (ADDERALL) 10 MG tablet, Take 1 tablet (10 mg total) by mouth every morning and then take 1 tablet (10 mg total) by mouth 4 hours later., Disp: 60 tablet, Rfl: 0  PAST MEDICAL HISTORY: Past Medical History:  Diagnosis Date   Acute blood loss anemia 01/06/2016   Anemia    Diabetes mellitus without complication (HCC) 2015   Gestational diabetes only.   Dysrhythmia    Gallstones    Gestational diabetes    2nd preg only   Headache(784.0)    otc med prn   Heartburn in pregnancy    IBS (irritable bowel syndrome)    Maternal iron deficiency anemia 01/06/2016   MS (multiple sclerosis) (HCC)    Postpartum care following cesarean delivery (5/11) 01/05/2016   Postpartum hypertension 03/07/2019   POTS (postural orthostatic tachycardia syndrome)    SVT (supraventricular tachycardia)    No problems since 10/2012 - no med needed   Vaginal Pap smear, abnormal    Vitamin deficiency     PAST SURGICAL HISTORY: Past Surgical History:  Procedure Laterality Date   ATRIAL ABLATION SURGERY  2003   FAILED   CESAREAN SECTION  2006   Breech   CESAREAN SECTION  2006   breech   CESAREAN SECTION N/A 12/19/2013   Procedure: REPEAT CESAREAN SECTION;  Surgeon: Lenoard Aden, MD;  Location: [facility];  Service: Obstetrics;  Laterality: N/A;  EDD: 12/25/13   CESAREAN SECTION N/A 01/05/2016   Procedure: Repeat CESAREAN SECTION;  Surgeon: Olivia Mackie, MD;  Location: [facility];  Service: Obstetrics;  Laterality: N/A;  EDD: 01/12/16    CESAREAN SECTION WITH BILATERAL TUBAL LIGATION Bilateral 02/25/2019   Procedure: Repeat CESAREAN SECTION WITH BILATERAL TUBAL LIGATION;  Surgeon: Olivia Mackie, MD;  Location: [facility];   Service: Obstetrics;  Laterality: Bilateral;  Heather, RNFA  EDD: 03/04/19 Allergy: Adhesive, Paxil, Cipro   CHOLECYSTECTOMY N/A 08/13/2019   Procedure: LAPAROSCOPIC CHOLECYSTECTOMY WITH  CHOLANGIOGRAM;  Surgeon: Gaynelle Adu, MD;  Location: [facility];  Service: General;  Laterality:  N/A;   COLPOSCOPY     WISDOM TOOTH EXTRACTION      FAMILY HISTORY: Family History  Problem Relation Age of Onset   Multiple sclerosis Mother    Migraines Mother    Emphysema Father    Heart disease Father    Migraines Father    Bladder Cancer Father    Lung cancer Father    Migraines Sister    Spina bifida Brother        half brother   Celiac disease Brother    Diabetes Maternal Grandmother    Colon cancer Maternal Grandmother    Cancer Paternal Grandfather        LUNG   Esophageal cancer Neg Hx    Stomach cancer Neg Hx     SOCIAL HISTORY:  Social History   Socioeconomic History   Marital status: Married    Spouse name: Rebecah Matura   Number of children: 4   Years of education: Not on file   Highest education level: Not on file  Occupational History   Occupation: [employer] nurse   Occupation: RN  Tobacco Use   Smoking status: Never   Smokeless tobacco: Never  Vaping Use   Vaping status: Never Used  Substance and Sexual Activity   Alcohol use: No   Drug use: No   Sexual activity: Yes    Birth control/protection: Pill    Comment: pregnant  Other Topics Concern   Not on file  Social History Narrative   Soda daily   Right handed    Lives with husband and 4 kids   She is ER Engineer, civil (consulting)       Social Determinants of Health   Financial Resource Strain: Not on file  Food Insecurity: Not on file  Transportation Needs: Not on file  Physical Activity: Not on file  Stress: Not on file  Social Connections: Not on file  Intimate Partner Violence: Not on file     PHYSICAL EXAM  Vitals:   05/22/23 1111  BP: 107/71  Pulse: 81  Weight: 150 lb (68 kg)  Height: 5\' 10"  (1.778 m)     Body mass index is 21.52 kg/m.   General: The patient is well-developed and well-nourished and in no acute distress  HEENT:  Head is Blyn/AT.  Sclera are anicteric.    Skin: Extremities are without rash or  edema.  Musculoskeletal:  Back is nontender  Neurologic Exam  Mental status: The patient is alert and oriented x 3 at the time of the examination. The patient has apparent normal recent and remote memory, with an apparently normal attention span and concentration ability.   Speech is normal.  Cranial nerves: Extraocular movements are full.  Facial strength and sensation was normal .  No dysarthria is noted.  . No obvious hearing deficits are noted.  Motor:  Muscle bulk is normal.   Tone is normal. Strength is  5 / 5 in all 4 extremities.   Sensory: Sensory testing is intact to pinprick, soft touch and vibration sensation in all 4 extremities.  Coordination: Cerebellar testing reveals good finger-nose-finger and heel-to-shin bilaterally.  Gait and station: Station is normal.   Gait is normal. Tandem gait is normal. Romberg is negative.   Reflexes: Deep tendon reflexes are symmetric and normal bilaterally.       DIAGNOSTIC DATA (LABS, IMAGING, TESTING) - I reviewed patient records, labs, notes, testing and imaging myself where available.  Lab Results  Component Value Date   WBC 4.5  04/24/2023   HGB 11.1 (L) 04/24/2023   HCT 33.5 (L) 04/24/2023   MCV 89.4 04/24/2023   PLT 187.0 04/24/2023      Component Value Date/Time   NA 139 04/24/2023 1152   NA 141 04/24/2022 1144   K 4.7 04/24/2023 1152   CL 105 04/24/2023 1152   CO2 28 04/24/2023 1152   GLUCOSE 95 04/24/2023 1152   BUN 7 04/24/2023 1152   BUN 8 04/24/2022 1144   CREATININE 0.67 04/24/2023 1152   CREATININE 0.78 03/30/2020 0838   CALCIUM 9.0 04/24/2023 1152   PROT 6.3 04/24/2023 1152   PROT 7.1 11/06/2022 1143   ALBUMIN 4.1 04/24/2023 1152   ALBUMIN 4.9 11/06/2022 1143   AST 12 04/24/2023 1152   ALT  10 04/24/2023 1152   ALKPHOS 43 04/24/2023 1152   BILITOT 0.7 04/24/2023 1152   BILITOT 1.0 11/06/2022 1143   GFRNONAA >60 08/03/2021 2215   GFRAA >60 08/13/2019 0420   Lab Results  Component Value Date   CHOL 149 04/24/2023   HDL 58.00 04/24/2023   LDLCALC 83 04/24/2023   TRIG 39.0 04/24/2023   CHOLHDL 3 04/24/2023   No results found for: "HGBA1C" Lab Results  Component Value Date   VITAMINB12 183 (L) 09/19/2021   Lab Results  Component Value Date   TSH 0.97 09/19/2021       ASSESSMENT AND PLAN  Multiple sclerosis (HCC)  High risk medication use  Attention deficit disorder (ADD) in adult  Urinary disorder   Continue Vumerity.   She had recent labs and lymphocytes were fine (1.4)     Continue Adderall 10 mg po bid for MS related ADD Stay active and exercise Rtc 6 months or sooner if new or worsening symptoms.   Rickiya Picariello A. Epimenio Foot, MD, [facility] 05/22/2023, 11:41 AM Certified in Neurology, Clinical Neurophysiology, Sleep Medicine and Neuroimaging  [facility] [address], [address] [place], [place] [address] [phone]

## 2023-06-11 ENCOUNTER — Other Ambulatory Visit: Payer: Self-pay | Admitting: Medical

## 2023-06-11 NOTE — Telephone Encounter (Signed)
Requesting: ativan Contract:n/a UDS:n/a Last Visit:04/24/23 Next Visit:n/a Last Refill:02/07/23  Please Advise

## 2023-06-13 ENCOUNTER — Other Ambulatory Visit (HOSPITAL_BASED_OUTPATIENT_CLINIC_OR_DEPARTMENT_OTHER): Payer: Self-pay

## 2023-06-13 MED ORDER — MELOXICAM 7.5 MG PO TABS
7.5000 mg | ORAL_TABLET | Freq: Every day | ORAL | 0 refills | Status: AC
  Filled 2023-06-13: qty 30, 30d supply, fill #0

## 2023-06-13 MED ORDER — LORAZEPAM 0.5 MG PO TABS
0.5000 mg | ORAL_TABLET | Freq: Three times a day (TID) | ORAL | 0 refills | Status: DC | PRN
  Filled 2023-06-13: qty 4, 2d supply, fill #0

## 2023-06-13 NOTE — Telephone Encounter (Signed)
Rx refill sent. No contract. Sporadic rare 4 tab rx of ativan only.

## 2023-07-19 ENCOUNTER — Other Ambulatory Visit (HOSPITAL_BASED_OUTPATIENT_CLINIC_OR_DEPARTMENT_OTHER): Payer: Self-pay

## 2023-07-19 ENCOUNTER — Telehealth: Payer: Commercial Managed Care - PPO | Admitting: Family Medicine

## 2023-07-19 DIAGNOSIS — N3000 Acute cystitis without hematuria: Secondary | ICD-10-CM

## 2023-07-19 MED ORDER — CEPHALEXIN 500 MG PO CAPS
500.0000 mg | ORAL_CAPSULE | Freq: Two times a day (BID) | ORAL | 0 refills | Status: AC
  Filled 2023-07-19: qty 14, 7d supply, fill #0

## 2023-07-19 NOTE — Progress Notes (Signed)

## 2023-08-27 ENCOUNTER — Other Ambulatory Visit: Payer: Self-pay

## 2023-08-27 ENCOUNTER — Other Ambulatory Visit: Payer: Self-pay | Admitting: Neurology

## 2023-08-27 ENCOUNTER — Other Ambulatory Visit (HOSPITAL_BASED_OUTPATIENT_CLINIC_OR_DEPARTMENT_OTHER): Payer: Self-pay

## 2023-08-27 MED ORDER — AMPHETAMINE-DEXTROAMPHETAMINE 10 MG PO TABS
10.0000 mg | ORAL_TABLET | Freq: Two times a day (BID) | ORAL | 0 refills | Status: DC
  Filled 2023-08-27: qty 60, 30d supply, fill #0

## 2023-08-27 NOTE — Telephone Encounter (Signed)
Pt last seen 05/22/2023 Upcoming Appointment 11/28/2023  Adderall Last Filled 06/03/2023 Escript 08/27/2023

## 2023-08-30 ENCOUNTER — Telehealth: Payer: Commercial Managed Care - PPO | Admitting: Family Medicine

## 2023-08-30 DIAGNOSIS — N390 Urinary tract infection, site not specified: Secondary | ICD-10-CM

## 2023-08-30 NOTE — Progress Notes (Signed)
 Because recurrent UTI- after virtual treatment and less than 2 months out, you need to have a sample collected to make sure you are getting the medication needed to clear infection.  We feel your condition warrants further evaluation and I recommend that you be seen in a face to face visit at your PCP office or local Urgent care.   NOTE: There will be NO CHARGE for this eVisit   If you are having a true medical emergency please call 911.

## 2023-09-10 DIAGNOSIS — R300 Dysuria: Secondary | ICD-10-CM | POA: Diagnosis not present

## 2023-09-10 DIAGNOSIS — N390 Urinary tract infection, site not specified: Secondary | ICD-10-CM | POA: Diagnosis not present

## 2023-09-16 ENCOUNTER — Encounter: Payer: Self-pay | Admitting: Neurology

## 2023-09-17 ENCOUNTER — Other Ambulatory Visit: Payer: Self-pay | Admitting: Neurology

## 2023-09-17 ENCOUNTER — Other Ambulatory Visit (HOSPITAL_BASED_OUTPATIENT_CLINIC_OR_DEPARTMENT_OTHER): Payer: Self-pay

## 2023-09-17 MED ORDER — BACLOFEN 10 MG PO TABS
5.0000 mg | ORAL_TABLET | Freq: Three times a day (TID) | ORAL | 2 refills | Status: AC
  Filled 2023-09-17: qty 90, 30d supply, fill #0
  Filled 2024-01-30: qty 90, 30d supply, fill #1
  Filled 2024-03-10: qty 90, 30d supply, fill #2

## 2023-10-04 ENCOUNTER — Other Ambulatory Visit: Payer: Self-pay | Admitting: Neurology

## 2023-10-04 ENCOUNTER — Other Ambulatory Visit: Payer: Self-pay

## 2023-10-10 ENCOUNTER — Other Ambulatory Visit (HOSPITAL_BASED_OUTPATIENT_CLINIC_OR_DEPARTMENT_OTHER): Payer: Self-pay

## 2023-10-10 MED ORDER — AMPHETAMINE-DEXTROAMPHETAMINE 10 MG PO TABS
10.0000 mg | ORAL_TABLET | Freq: Two times a day (BID) | ORAL | 0 refills | Status: DC
Start: 2023-10-10 — End: 2023-12-04
  Filled 2023-10-10 – 2023-10-25 (×2): qty 60, 30d supply, fill #0

## 2023-10-10 NOTE — Telephone Encounter (Signed)
Last seen 05/22/23 and next f/u 11/28/23. Last refilled 08/29/23 #60.

## 2023-10-15 DIAGNOSIS — Z01419 Encounter for gynecological examination (general) (routine) without abnormal findings: Secondary | ICD-10-CM | POA: Diagnosis not present

## 2023-10-15 DIAGNOSIS — Z1231 Encounter for screening mammogram for malignant neoplasm of breast: Secondary | ICD-10-CM | POA: Diagnosis not present

## 2023-10-15 DIAGNOSIS — Z113 Encounter for screening for infections with a predominantly sexual mode of transmission: Secondary | ICD-10-CM | POA: Diagnosis not present

## 2023-10-15 DIAGNOSIS — Z1331 Encounter for screening for depression: Secondary | ICD-10-CM | POA: Diagnosis not present

## 2023-10-15 DIAGNOSIS — Z01411 Encounter for gynecological examination (general) (routine) with abnormal findings: Secondary | ICD-10-CM | POA: Diagnosis not present

## 2023-10-15 DIAGNOSIS — Z124 Encounter for screening for malignant neoplasm of cervix: Secondary | ICD-10-CM | POA: Diagnosis not present

## 2023-10-15 LAB — HM PAP SMEAR: HPV, high-risk: NEGATIVE

## 2023-10-15 LAB — HM MAMMOGRAPHY

## 2023-10-15 LAB — RESULTS CONSOLE HPV: CHL HPV: NEGATIVE

## 2023-10-21 ENCOUNTER — Telehealth: Payer: Self-pay | Admitting: Pharmacist

## 2023-10-21 ENCOUNTER — Other Ambulatory Visit (HOSPITAL_BASED_OUTPATIENT_CLINIC_OR_DEPARTMENT_OTHER): Payer: Self-pay

## 2023-10-21 NOTE — Telephone Encounter (Signed)
 Pharmacy Patient Advocate Encounter  Received notification from [facility] that Prior Authorization for VUMERITY 231MG  delayed-release capsules has been APPROVED from 10/21/2023 to 10/20/2024   PA #/Case ID/Reference #: [id number]

## 2023-10-25 ENCOUNTER — Other Ambulatory Visit (HOSPITAL_BASED_OUTPATIENT_CLINIC_OR_DEPARTMENT_OTHER): Payer: Self-pay

## 2023-11-06 ENCOUNTER — Other Ambulatory Visit (HOSPITAL_BASED_OUTPATIENT_CLINIC_OR_DEPARTMENT_OTHER): Payer: Self-pay

## 2023-11-28 ENCOUNTER — Ambulatory Visit: Payer: Commercial Managed Care - PPO | Admitting: Neurology

## 2023-11-28 ENCOUNTER — Encounter: Payer: Self-pay | Admitting: Neurology

## 2023-11-28 VITALS — BP 96/67 | HR 60 | Ht 70.0 in | Wt 154.0 lb

## 2023-11-28 DIAGNOSIS — N399 Disorder of urinary system, unspecified: Secondary | ICD-10-CM

## 2023-11-28 DIAGNOSIS — F988 Other specified behavioral and emotional disorders with onset usually occurring in childhood and adolescence: Secondary | ICD-10-CM | POA: Diagnosis not present

## 2023-11-28 DIAGNOSIS — E538 Deficiency of other specified B group vitamins: Secondary | ICD-10-CM

## 2023-11-28 DIAGNOSIS — G35 Multiple sclerosis: Secondary | ICD-10-CM

## 2023-11-28 DIAGNOSIS — R200 Anesthesia of skin: Secondary | ICD-10-CM | POA: Diagnosis not present

## 2023-11-28 DIAGNOSIS — R5383 Other fatigue: Secondary | ICD-10-CM | POA: Diagnosis not present

## 2023-11-28 DIAGNOSIS — Z79899 Other long term (current) drug therapy: Secondary | ICD-10-CM | POA: Diagnosis not present

## 2023-11-28 DIAGNOSIS — E559 Vitamin D deficiency, unspecified: Secondary | ICD-10-CM

## 2023-11-28 NOTE — Progress Notes (Signed)
 [facility]  PATIENT: Karen Powers DOB: [birth year]  REFERRING DOCTOR OR PCP: Esperanza Richters, PA-C SOURCE: Patient, notes from neurology, imaging and laboratory reports, MRI images personally reviewed.  _________________________________   HISTORICAL  CHIEF COMPLAINT:  Chief Complaint  Patient presents with   Follow-up    Pt in 10 alone Pt here for MS f/u Pt denies falls in last six months Pt states last eye exam was 2 years ago     HISTORY OF PRESENT ILLNESS:  Karen Powers is a 43 y.o. woman with multiple sclerosis and treatment options.  Update 05/22/2023 She is on Vumerity and tolrates it well.   No GI upset though she has some flushing, especially the dose after a forgotten dose.   She denies major new neurologic issues.   No exacerbations.  She has had more spasms in her legs, often after she has been more active and then rests.  Baclofen has helped and does not make her too sleepy.  She works night shifts.    MRI 4/232024 showed no new lesions  Gait is doing well.   She will use the bannister on stairs but can do without.  She also has knee issues.  No falls.   No weakness or numbness.     She has no urinary hesitancy but is not sure completely empties.  She has had a few UTIs    Sincee starting Azo-cranberry this is better.      Vision is ok though she has trouble focusing on words at time.    Her OD vision is generally worse than OS (even before MS)  Color vision is symmetric.  She notes some dysphagia and EGD showed mild changes likely due to GERD.   A PPI was started.      She has more fatigue and some mild mental fog.    She has difficulty with focus/attention and Adderall helps.  She ofte takes before work Insomnia is better with trazodone..     She is a Engineer, civil (consulting).   She denies significant depression but occasionally feels down.       She works as Nutritional therapist.  She has 4 kids.    MS Hstory She was diagnosed with MS in 2009 at age 61.   In  September 2009, she was experiencing numbness in her right leg,   Her mother had MS prompting an evaluation with an MRI showing several periventricular T2 hyperintense foci and a focus in the left cerebral peduncle.    CSF was analyzed and was also consistent with MS,   She was diagnosed by Dr. Orlin Hilding and then started to see Dr. Leotis Shames in [place]   She was started on Copaxone but only stayed on a couple years.   She was starting a family and had several more prognosis over the next 10 years..  She was seen Dr. Renne Crigler at [facility].  In 2018, she had some visual changes and initiation of a disease modifying therapy was recommended though she did not start due to insurance issues., She had additional neurologic symptoms January or February 2019.   She noted that when she would turn her head to the right, she would get a wet sensation in her right leg.    She then had an episode of severe vertigo a week or two later that persisted a couple weeks.  MRI 10/22/2017 showed several enhancing lesions in the cervical spine and no new lesions in the brain.  She received a  few days of IV Solumedrol and was better 1-2 weeks later and symptoms resolved a week or two after that.     She was planning an additional pregnancy so held off a treatment   In December 2022, she felt more off balanced but MRI brain and spine showed no new lesions.   She has since improved and is back at baseline.  She started Vumerity January 2023.  She has 4 children born 2006, 2015, 2017, and 2020.   She is not planning additional children  Imaging:  MRI of the brain 12/18/2022 showed Scattered T2/FLAIR hypertense foci in the cerebral hemispheres in a pattern consistent with chronic [place] plaque associated with multiple sclerosis. None of the foci appeared acute. They did not enhance. Compared to the MRI from 08/04/2021, there were no new lesions.   MRI of the brain 08/04/2021 showed scattered T2/FLAIR hyperintense foci predominantly in the  periventricular white matter.  None of the foci enhance or appear to be acute.  No new lesions compared to 10/22/2017  MRI of the cervical and thoracic spine 08/04/2021 showed subtle T2 hyperintense foci in the upper cervical spine, with improved appearance compared to the 2019 MRI.  No enhancing lesions.  No additional lesions.  The MRI of the thoracic spine did not show any MS lesions.  MRI of the cervical spine 10/22/2017 showed several enhancing lesions in the cervical spine, to the left adjacent to C2, anteriorly adjacent to C2 and to the right adjacent to C2-C3.  An additional nonenhancing lesion is noted to the left adjacent to C4.  None of these foci were present on the MRI from 07/13/2017 (normal spinal cord)  MRI of the brain 10/22/2017 showed scattered T2/FLAIR hyperintense foci in the hemispheres, predominantly in the periventricular white matter.  None of the foci enhance.  No definite new lesions compared to 07/13/2017.  MRI of the brain 07/13/2017 showed scattered T2/FLAIR hyperintense foci.  Dominantly in the periventricular white matter with a subtle focus in the left cerebral peduncle.  There has been mild progression compared to the 10/20/2010 MRI.  MRI of the brain 10/20/2010 and 05/17/2008 showed T2/FLAIR hyperintense foci predominantly in the periventricular white matter.  These 2 studies were stable.  REVIEW OF SYSTEMS: Constitutional: No fevers, chills, sweats, or change in appetite Eyes: No visual changes, double vision, eye pain Ear, nose and throat: No hearing loss, ear pain, nasal congestion, sore throat Cardiovascular: No chest pain, palpitations Respiratory:  No shortness of breath at rest or with exertion.   No wheezes GastrointestinaI: No nausea, vomiting, diarrhea, abdominal pain, fecal incontinence Genitourinary:  No dysuria, urinary retention or frequency.  No nocturia. Musculoskeletal:  No neck pain, back pain Integumentary: No rash, pruritus, skin  lesions Neurological: as above Psychiatric: No depression at this time.  No anxiety Endocrine: No palpitations, diaphoresis, change in appetite, change in weigh or increased thirst Hematologic/Lymphatic:  No anemia, purpura, petechiae. Allergic/Immunologic: No itchy/runny eyes, nasal congestion, recent allergic reactions, rashes  ALLERGIES: Allergies  Allergen Reactions   Ciprofloxacin Rash and Other (See Comments)    Has patient had a PCN reaction causing immediate rash, facial/tongue/throat swelling, SOB or lightheadedness with hypotension: No Has patient had a PCN reaction causing severe rash involving mucus membranes or skin necrosis: No Has patient had a PCN reaction that required hospitalization No Has patient had a PCN reaction occurring within the last 10 years: Yes If all of the above answers are "NO", then may proceed with Cephalosporin use.  HOME MEDICATIONS:  Current Outpatient Medications:    acetaminophen (TYLENOL) 500 MG tablet, Take 1,000 mg by mouth every 6 (six) hours as needed for moderate pain or headache., Disp: , Rfl:    amphetamine-dextroamphetamine (ADDERALL) 10 MG tablet, Take 1 tablet (10 mg total) by mouth every morning and then take 1 tablet (10 mg total) by mouth 4 hours later., Disp: 60 tablet, Rfl: 0   baclofen (LIORESAL) 10 MG tablet, Take 0.5-1 tablets (5-10 mg total) by mouth 3 (three) times daily., Disp: 90 each, Rfl: 2   Diroximel Fumarate (VUMERITY) 231 MG CPDR, Take 231 mg by mouth in the morning and at bedtime., Disp: 60 capsule, Rfl: 0   ibuprofen (ADVIL) 200 MG tablet, Take 800 mg by mouth every 6 (six) hours as needed for headache or mild pain., Disp: , Rfl:    LORazepam (ATIVAN) 0.5 MG tablet, Take 1 tablet (0.5 mg total) by mouth every 8 (eight) hours as needed for anxiety., Disp: 4 tablet, Rfl: 0   meloxicam (MOBIC) 7.5 MG tablet, Take 1 tablet (7.5 mg total) by mouth daily., Disp: 30 tablet, Rfl: 0   ondansetron (ZOFRAN) 4 MG tablet, Take  1-2 tablets (4-8 mg total) by mouth every 8 (eight) hours as needed for nausea or vomiting., Disp: 30 tablet, Rfl: 0   traZODone (DESYREL) 50 MG tablet, Take 1 tablet (50 mg total) by mouth at bedtime., Disp: 30 tablet, Rfl: 11   VUMERITY 231 MG CPDR, TAKE 2 CAPSULES (462MG ) BY MOUTH TWICE DAILY, Disp: 120 capsule, Rfl: 11   nitrofurantoin, macrocrystal-monohydrate, (MACROBID) 100 MG capsule, Take 1 capsule (100 mg total) by mouth 2 (two) times daily., Disp: 10 capsule, Rfl: 0  PAST MEDICAL HISTORY: Past Medical History:  Diagnosis Date   Acute blood loss anemia 01/06/2016   Anemia    Diabetes mellitus without complication (HCC) 2015   Gestational diabetes only.   Dysrhythmia    Gallstones    Gestational diabetes    2nd preg only   Headache(784.0)    otc med prn   Heartburn in pregnancy    IBS (irritable bowel syndrome)    Maternal iron deficiency anemia 01/06/2016   MS (multiple sclerosis) (HCC)    Postpartum care following cesarean delivery (5/11) 01/05/2016   Postpartum hypertension 03/07/2019   POTS (postural orthostatic tachycardia syndrome)    SVT (supraventricular tachycardia) (HCC)    No problems since 10/2012 - no med needed   Vaginal Pap smear, abnormal    Vitamin deficiency     PAST SURGICAL HISTORY: Past Surgical History:  Procedure Laterality Date   ATRIAL ABLATION SURGERY  2003   FAILED   CESAREAN SECTION  2006   Breech   CESAREAN SECTION  2006   breech   CESAREAN SECTION N/A 12/19/2013   Procedure: REPEAT CESAREAN SECTION;  Surgeon: Lenoard Aden, MD;  Location: [facility];  Service: Obstetrics;  Laterality: N/A;  EDD: 12/25/13   CESAREAN SECTION N/A 01/05/2016   Procedure: Repeat CESAREAN SECTION;  Surgeon: Olivia Mackie, MD;  Location: [facility];  Service: Obstetrics;  Laterality: N/A;  EDD: 01/12/16    CESAREAN SECTION WITH BILATERAL TUBAL LIGATION Bilateral 02/25/2019   Procedure: Repeat CESAREAN SECTION WITH BILATERAL TUBAL LIGATION;  Surgeon:  Olivia Mackie, MD;  Location: [facility];  Service: Obstetrics;  Laterality: Bilateral;  Heather, RNFA  EDD: 03/04/19 Allergy: Adhesive, Paxil, Cipro   CHOLECYSTECTOMY N/A 08/13/2019   Procedure: LAPAROSCOPIC CHOLECYSTECTOMY WITH  CHOLANGIOGRAM;  Surgeon: Gaynelle Adu, MD;  Location:  [facility] ORS;  Service: General;  Laterality: N/A;   COLPOSCOPY     WISDOM TOOTH EXTRACTION      FAMILY HISTORY: Family History  Problem Relation Age of Onset   Multiple sclerosis Mother    Migraines Mother    Emphysema Father    Heart disease Father    Migraines Father    Bladder Cancer Father    Lung cancer Father    Migraines Sister    Spina bifida Brother        half brother   Celiac disease Brother    Diabetes Maternal Grandmother    Colon cancer Maternal Grandmother    Cancer Paternal Grandfather        LUNG   Esophageal cancer Neg Hx    Stomach cancer Neg Hx     SOCIAL HISTORY:  Social History   Socioeconomic History   Marital status: Married    Spouse name: Ahnesty Finfrock   Number of children: 4   Years of education: Not on file   Highest education level: Not on file  Occupational History   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   Occupation: RN  Tobacco Use   Smoking status: Never   Smokeless tobacco: Never  Vaping Use   Vaping status: Never Used  Substance and Sexual Activity   Alcohol use: No   Drug use: No   Sexual activity: Yes    Birth control/protection: Pill    Comment: pregnant  Other Topics Concern   Not on file  Social History Narrative   Soda daily   Right handed    Lives with husband and 4 kids   She is ER Engineer, civil (consulting)       Social Drivers of Corporate investment banker Strain: Not on file  Food Insecurity: Not on file  Transportation Needs: Not on file  Physical Activity: Not on file  Stress: Not on file  Social Connections: Not on file  Intimate Partner Violence: Not on file     PHYSICAL EXAM  Vitals:   11/28/23 0935  BP: 96/67  Pulse: 60  Weight: 154 lb  (69.9 kg)  Height: 5\' 10"  (1.778 m)    Body mass index is 22.1 kg/m.   General: The patient is well-developed and well-nourished and in no acute distress  HEENT:  Head is [place]/AT.  Sclera are anicteric.    Skin: Extremities are without rash or  edema.  Musculoskeletal:  Back is nontender  Neurologic Exam  Mental status: The patient is alert and oriented x 3 at the time of the examination. The patient has apparent normal recent and remote memory, with an apparently normal attention span and concentration ability.   Speech is normal.  Cranial nerves: Extraocular movements are full.  Facial strength and sensation was normal .  No dysarthria is noted.  . No obvious hearing deficits are noted.  Motor:  Muscle bulk is normal.   Tone is normal. Strength is  5 / 5 in all 4 extremities.   Sensory: Sensory testing is intact to pinprick, soft touch and vibration sensation in all 4 extremities.  Coordination: Cerebellar testing reveals good finger-nose-finger and heel-to-shin bilaterally.  Gait and station: Station is normal.   Gait is normal. Tandem gait is slightly wide. Romberg is negative.   Reflexes: Deep tendon reflexes are symmetric and normal bilaterally.       DIAGNOSTIC DATA (LABS, IMAGING, TESTING) - I reviewed patient records, labs, notes, testing and imaging myself where available.  Lab Results  Component Value Date   WBC 4.5 04/24/2023   HGB 11.1 (L) 04/24/2023   HCT 33.5 (L) 04/24/2023   MCV 89.4 04/24/2023   PLT 187.0 04/24/2023      Component Value Date/Time   NA 139 04/24/2023 1152   NA 141 04/24/2022 1144   K 4.7 04/24/2023 1152   CL 105 04/24/2023 1152   CO2 28 04/24/2023 1152   GLUCOSE 95 04/24/2023 1152   BUN 7 04/24/2023 1152   BUN 8 04/24/2022 1144   CREATININE 0.67 04/24/2023 1152   CREATININE 0.78 03/30/2020 0838   CALCIUM 9.0 04/24/2023 1152   PROT 6.3 04/24/2023 1152   PROT 7.1 11/06/2022 1143   ALBUMIN 4.1 04/24/2023 1152   ALBUMIN 4.9  11/06/2022 1143   AST 12 04/24/2023 1152   ALT 10 04/24/2023 1152   ALKPHOS 43 04/24/2023 1152   BILITOT 0.7 04/24/2023 1152   BILITOT 1.0 11/06/2022 1143   GFRNONAA >60 08/03/2021 2215   GFRAA >60 08/13/2019 0420   Lab Results  Component Value Date   CHOL 149 04/24/2023   HDL 58.00 04/24/2023   LDLCALC 83 04/24/2023   TRIG 39.0 04/24/2023   CHOLHDL 3 04/24/2023   No results found for: "HGBA1C" Lab Results  Component Value Date   VITAMINB12 183 (L) 09/19/2021   Lab Results  Component Value Date   TSH 0.97 09/19/2021       ASSESSMENT AND PLAN  No diagnosis found.   Continue Vumerity.   Check labs  Continue Adderall 10 mg po bid for MS related ADD Stay active and exercise Rtc 6 months or sooner if new or worsening symptoms.   Latish Toutant A. Epimenio Foot, MD, [facility] 11/28/2023, 9:40 AM Certified in Neurology, Clinical Neurophysiology, Sleep Medicine and Neuroimaging  [facility] [address] [place], [place] [address] [phone]

## 2023-11-29 ENCOUNTER — Other Ambulatory Visit (HOSPITAL_BASED_OUTPATIENT_CLINIC_OR_DEPARTMENT_OTHER): Payer: Self-pay

## 2023-11-29 ENCOUNTER — Other Ambulatory Visit: Payer: Self-pay | Admitting: Neurology

## 2023-11-29 ENCOUNTER — Encounter: Payer: Self-pay | Admitting: Neurology

## 2023-11-29 LAB — CBC WITH DIFFERENTIAL/PLATELET
Basophils Absolute: 0 10*3/uL (ref 0.0–0.2)
Basos: 1 %
EOS (ABSOLUTE): 0.1 10*3/uL (ref 0.0–0.4)
Eos: 3 %
Hematocrit: 30.6 % — ABNORMAL LOW (ref 34.0–46.6)
Hemoglobin: 9.9 g/dL — ABNORMAL LOW (ref 11.1–15.9)
Immature Grans (Abs): 0 10*3/uL (ref 0.0–0.1)
Immature Granulocytes: 0 %
Lymphocytes Absolute: 1.6 10*3/uL (ref 0.7–3.1)
Lymphs: 37 %
MCH: 28.9 pg (ref 26.6–33.0)
MCHC: 32.4 g/dL (ref 31.5–35.7)
MCV: 90 fL (ref 79–97)
Monocytes Absolute: 0.4 10*3/uL (ref 0.1–0.9)
Monocytes: 9 %
Neutrophils Absolute: 2.2 10*3/uL (ref 1.4–7.0)
Neutrophils: 50 %
Platelets: 155 10*3/uL (ref 150–450)
RBC: 3.42 x10E6/uL — ABNORMAL LOW (ref 3.77–5.28)
RDW: 13.1 % (ref 11.7–15.4)
WBC: 4.4 10*3/uL (ref 3.4–10.8)

## 2023-11-29 LAB — COMPREHENSIVE METABOLIC PANEL WITH GFR
ALT: 11 IU/L (ref 0–32)
AST: 15 IU/L (ref 0–40)
Albumin: 4.2 g/dL (ref 3.9–4.9)
Alkaline Phosphatase: 52 IU/L (ref 44–121)
BUN/Creatinine Ratio: 13 (ref 9–23)
BUN: 9 mg/dL (ref 6–24)
Bilirubin Total: 0.5 mg/dL (ref 0.0–1.2)
CO2: 24 mmol/L (ref 20–29)
Calcium: 8.4 mg/dL — ABNORMAL LOW (ref 8.7–10.2)
Chloride: 103 mmol/L (ref 96–106)
Creatinine, Ser: 0.69 mg/dL (ref 0.57–1.00)
Globulin, Total: 1.9 g/dL (ref 1.5–4.5)
Glucose: 87 mg/dL (ref 70–99)
Potassium: 4.2 mmol/L (ref 3.5–5.2)
Sodium: 139 mmol/L (ref 134–144)
Total Protein: 6.1 g/dL (ref 6.0–8.5)
eGFR: 111 mL/min/{1.73_m2} (ref >59)

## 2023-11-29 LAB — VITAMIN B12: Vitamin B-12: 188 pg/mL — ABNORMAL LOW (ref 232–1245)

## 2023-11-29 LAB — VITAMIN D 25 HYDROXY (VIT D DEFICIENCY, FRACTURES): Vit D, 25-Hydroxy: 22.6 ng/mL — ABNORMAL LOW (ref 30.0–100.0)

## 2023-11-29 MED ORDER — VITAMIN D (ERGOCALCIFEROL) 1.25 MG (50000 UNIT) PO CAPS
50000.0000 [IU] | ORAL_CAPSULE | ORAL | 1 refills | Status: DC
  Filled 2023-11-29: qty 13, 90d supply, fill #0
  Filled 2024-03-10: qty 13, 90d supply, fill #1

## 2023-12-04 ENCOUNTER — Other Ambulatory Visit: Payer: Self-pay | Admitting: Neurology

## 2023-12-05 ENCOUNTER — Other Ambulatory Visit: Payer: Self-pay

## 2023-12-05 ENCOUNTER — Other Ambulatory Visit (HOSPITAL_BASED_OUTPATIENT_CLINIC_OR_DEPARTMENT_OTHER): Payer: Self-pay

## 2023-12-05 MED ORDER — TRAZODONE HCL 50 MG PO TABS
50.0000 mg | ORAL_TABLET | Freq: Every day | ORAL | 7 refills | Status: AC
  Filled 2023-12-05: qty 30, 30d supply, fill #0
  Filled 2024-01-30: qty 30, 30d supply, fill #1
  Filled 2024-03-10: qty 30, 30d supply, fill #2
  Filled 2024-06-17: qty 30, 30d supply, fill #3
  Filled 2024-09-09: qty 30, 30d supply, fill #4

## 2023-12-05 MED ORDER — AMPHETAMINE-DEXTROAMPHETAMINE 10 MG PO TABS
10.0000 mg | ORAL_TABLET | Freq: Two times a day (BID) | ORAL | 0 refills | Status: DC
  Filled 2023-12-05: qty 60, 30d supply, fill #0

## 2023-12-05 NOTE — Telephone Encounter (Signed)
 Last seen on 11/28/23 Follow up scheduled on 07/07/24 Adderall 10 mg last filled on 10/25/23 #60 tablets (30 day supply) Rx pending to be signed

## 2023-12-11 ENCOUNTER — Telehealth: Admitting: Physician Assistant

## 2023-12-11 DIAGNOSIS — R3989 Other symptoms and signs involving the genitourinary system: Secondary | ICD-10-CM

## 2023-12-11 MED ORDER — CEPHALEXIN 500 MG PO CAPS
500.0000 mg | ORAL_CAPSULE | Freq: Two times a day (BID) | ORAL | 0 refills | Status: AC
  Filled 2023-12-11: qty 14, 7d supply, fill #0

## 2023-12-11 NOTE — Progress Notes (Signed)

## 2023-12-11 NOTE — Progress Notes (Signed)
 I have spent 5 minutes in review of e-visit questionnaire, review and updating patient chart, medical decision making and response to patient.   Piedad Climes, PA-C

## 2023-12-12 ENCOUNTER — Other Ambulatory Visit (HOSPITAL_BASED_OUTPATIENT_CLINIC_OR_DEPARTMENT_OTHER): Payer: Self-pay

## 2023-12-13 ENCOUNTER — Encounter: Payer: Self-pay | Admitting: Medical

## 2023-12-26 ENCOUNTER — Ambulatory Visit: Admitting: Medical

## 2023-12-26 ENCOUNTER — Other Ambulatory Visit (HOSPITAL_BASED_OUTPATIENT_CLINIC_OR_DEPARTMENT_OTHER): Payer: Self-pay

## 2023-12-26 VITALS — BP 100/68 | HR 87 | Temp 98.3°F | Resp 16 | Ht 70.0 in | Wt 153.4 lb

## 2023-12-26 DIAGNOSIS — R300 Dysuria: Secondary | ICD-10-CM

## 2023-12-26 LAB — POC URINALSYSI DIPSTICK (AUTOMATED)
Bilirubin, UA: NEGATIVE
Blood, UA: NEGATIVE
Glucose, UA: NEGATIVE
Ketones, UA: NEGATIVE
Nitrite, UA: NEGATIVE
Protein, UA: NEGATIVE
Spec Grav, UA: 1.01 (ref 1.010–1.025)
Urobilinogen, UA: 0.2 U/dL
pH, UA: 7 (ref 5.0–8.0)

## 2023-12-26 MED ORDER — NITROFURANTOIN MONOHYD MACRO 100 MG PO CAPS
100.0000 mg | ORAL_CAPSULE | Freq: Two times a day (BID) | ORAL | 0 refills | Status: DC
  Filled 2023-12-26: qty 14, 7d supply, fill #0

## 2023-12-26 NOTE — Patient Instructions (Signed)
 Recurrent urinary tract infections (dysuria yesterday) Recurrent UTIs with E. coli. Symptoms improved today. Discussed Macrobid  post-coital prophylaxis. - Prescribe Macrobid  100 mg twice daily for 7 days. - Send prescription for Macrobid  for post-coital prophylaxis, 10 tablets with one refill. - Update when culture results are available.  Follow up date to be determined after lab review.

## 2023-12-26 NOTE — Progress Notes (Signed)
 Subjective:    Patient ID: Karen Powers, female    DOB: Dec 24, 1980, 43 y.o.   MRN: [id number]  HPI Discussed the use of AI scribe software for clinical note transcription with the patient, who gave verbal consent to proceed.  History of Present Illness   Karen Powers is a 43 year old female with recurrent urinary tract infections who presents with urinary symptoms.  She experienced urinary symptoms yesterday, including dysuria, increased frequency, and urgency, but notes improvement today. She mentions that if yesterday's symptoms had not occurred, she would not have sought medical attention today.  She has experienced approximately one urinary tract infection per month this year, totaling four episodes in 2025. She recalls a similar pattern a few years ago and was previously prescribed an antibiotic to take post-coital, which she found effective at the time.  She has had positive urine cultures in the past, with E. coli identified as the causative organism. She often seeks care at [facility] due to the timing of her symptoms and work commitments, and these visits have confirmed positive cultures.  Her past treatments for UTIs have included Macrobid , Keflex , and Bactrim, all of which have been effective. She also started taking cranberry supplements after her second UTI this year.            Lmp-December 19, 2022.  Review of Systems  Constitutional:  Negative for chills, fatigue and fever.  HENT:  Negative for congestion.   Respiratory:  Negative for cough, chest tightness and wheezing.   Cardiovascular:  Negative for chest pain and palpitations.  Gastrointestinal:  Negative for abdominal pain and constipation.  Genitourinary:  Positive for dysuria. Negative for frequency, pelvic pain, urgency and vaginal pain.  Musculoskeletal:  Negative for back pain and myalgias.  Skin:  Negative for rash.  Neurological:  Negative for dizziness and light-headedness.  Hematological:   Negative for adenopathy.  Psychiatric/Behavioral:  Negative for behavioral problems and confusion.     Past Medical History:  Diagnosis Date   Acute blood loss anemia 01/06/2016   Anemia    Diabetes mellitus without complication (HCC) 2015   Gestational diabetes only.   Dysrhythmia    Gallstones    Gestational diabetes    2nd preg only   Headache(784.0)    otc med prn   Heartburn in pregnancy    IBS (irritable bowel syndrome)    Maternal iron  deficiency anemia 01/06/2016   MS (multiple sclerosis) (HCC)    Postpartum care following cesarean delivery (5/11) 01/05/2016   Postpartum hypertension 03/07/2019   POTS (postural orthostatic tachycardia syndrome)    SVT (supraventricular tachycardia) (HCC)    No problems since 10/2012 - no med needed   Vaginal Pap smear, abnormal    Vitamin deficiency      Social History   Socioeconomic History   Marital status: Married    Spouse name: Desare Diers   Number of children: 4   Years of education: Not on file   Highest education level: Bachelor's degree (e.g., BA, AB, BS)  Occupational History   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   Occupation: RN  Tobacco Use   Smoking status: Never   Smokeless tobacco: Never  Vaping Use   Vaping status: Never Used  Substance and Sexual Activity   Alcohol use: No   Drug use: No   Sexual activity: Yes    Birth control/protection: Pill    Comment: pregnant  Other Topics Concern   Not on file  Social  History Narrative   Soda daily   Right handed    Lives with husband and 4 kids   She is ER nurse       Social Drivers of Health   Financial Resource Strain: Low Risk  (12/26/2023)   Overall Financial Resource Strain (CARDIA)    Difficulty of Paying Living Expenses: Not hard at all  Food Insecurity: No Food Insecurity (12/26/2023)   Hunger Vital Sign    Worried About Running Out of Food in the Last Year: Never true    Ran Out of Food in the Last Year: Never true  Transportation Needs: No  Transportation Needs (12/26/2023)   PRAPARE - Administrator, Civil Service (Medical): No    Lack of Transportation (Non-Medical): No  Physical Activity: Unknown (12/26/2023)   Exercise Vital Sign    Days of Exercise per Week: 0 days    Minutes of Exercise per Session: Not on file  Stress: No Stress Concern Present (12/26/2023)   [employer] of Occupational Health - Occupational Stress Questionnaire    Feeling of Stress : Only a little  Social Connections: Moderately Isolated (12/26/2023)   Social Connection and Isolation Panel [NHANES]    Frequency of Communication with Friends and Family: Twice a week    Frequency of Social Gatherings with Friends and Family: Once a week    Attends Religious Services: Never    Database administrator or Organizations: No    Attends Engineer, structural: Not on file    Marital Status: Married  Catering manager Violence: Not on file    Past Surgical History:  Procedure Laterality Date   ATRIAL ABLATION SURGERY  2003   FAILED   CESAREAN SECTION  2006   Breech   CESAREAN SECTION  2006   breech   CESAREAN SECTION N/A 12/19/2013   Procedure: REPEAT CESAREAN SECTION;  Surgeon: Camillo Celestine, MD;  Location: [facility];  Service: Obstetrics;  Laterality: N/A;  EDD: 12/25/13   CESAREAN SECTION N/A 01/05/2016   Procedure: Repeat CESAREAN SECTION;  Surgeon: Meriam Stamp, MD;  Location: [facility];  Service: Obstetrics;  Laterality: N/A;  EDD: 01/12/16    CESAREAN SECTION WITH BILATERAL TUBAL LIGATION Bilateral 02/25/2019   Procedure: Repeat CESAREAN SECTION WITH BILATERAL TUBAL LIGATION;  Surgeon: Meriam Stamp, MD;  Location: [facility];  Service: Obstetrics;  Laterality: Bilateral;  Heather, RNFA  EDD: 03/04/19 Allergy: Adhesive, Paxil, Cipro   CHOLECYSTECTOMY N/A 08/13/2019   Procedure: LAPAROSCOPIC CHOLECYSTECTOMY WITH  CHOLANGIOGRAM;  Surgeon: Aldean Hummingbird, MD;  Location: [facility];  Service: General;  Laterality: N/A;   COLPOSCOPY      WISDOM TOOTH EXTRACTION      Family History  Problem Relation Age of Onset   Multiple sclerosis Mother    Migraines Mother    Emphysema Father    Heart disease Father    Migraines Father    Bladder Cancer Father    Lung cancer Father    Migraines Sister    Spina bifida Brother        half brother   Celiac disease Brother    Diabetes Maternal Grandmother    Colon cancer Maternal Grandmother    Cancer Paternal Grandfather        LUNG   Esophageal cancer Neg Hx    Stomach cancer Neg Hx     Allergies  Allergen Reactions   Ciprofloxacin Rash and Other (See Comments)    Has patient had a PCN reaction  causing immediate rash, facial/tongue/throat swelling, SOB or lightheadedness with hypotension: No Has patient had a PCN reaction causing severe rash involving mucus membranes or skin necrosis: No Has patient had a PCN reaction that required hospitalization No Has patient had a PCN reaction occurring within the last 10 years: Yes If all of the above answers are "NO", then may proceed with Cephalosporin use.    Current Outpatient Medications on File Prior to Visit  Medication Sig Dispense Refill   acetaminophen  (TYLENOL ) 500 MG tablet Take 1,000 mg by mouth every 6 (six) hours as needed for moderate pain or headache.     amphetamine -dextroamphetamine  (ADDERALL) 10 MG tablet Take 1 tablet (10 mg total) by mouth every morning and then take 1 tablet (10 mg total) by mouth 4 hours later. 60 tablet 0   baclofen  (LIORESAL ) 10 MG tablet Take 0.5-1 tablets (5-10 mg total) by mouth 3 (three) times daily. 90 each 2   Diroximel Fumarate  (VUMERITY ) 231 MG CPDR Take 231 mg by mouth in the morning and at bedtime. 60 capsule 0   ibuprofen  (ADVIL ) 200 MG tablet Take 800 mg by mouth every 6 (six) hours as needed for headache or mild pain.     LORazepam  (ATIVAN ) 0.5 MG tablet Take 1 tablet (0.5 mg total) by mouth every 8 (eight) hours as needed for anxiety. 4 tablet 0   meloxicam  (MOBIC ) 7.5 MG  tablet Take 1 tablet (7.5 mg total) by mouth daily. 30 tablet 0   ondansetron  (ZOFRAN ) 4 MG tablet Take 1-2 tablets (4-8 mg total) by mouth every 8 (eight) hours as needed for nausea or vomiting. 30 tablet 0   traZODone  (DESYREL ) 50 MG tablet Take 1 tablet (50 mg total) by mouth at bedtime. 30 tablet 7   Vitamin D , Ergocalciferol , (DRISDOL ) 1.25 MG (50000 UNIT) CAPS capsule Take 1 capsule (50,000 Units total) by mouth every 7 (seven) days. 13 capsule 1   VUMERITY  231 MG CPDR TAKE 2 CAPSULES (462MG ) BY MOUTH TWICE DAILY 120 capsule 11   No current facility-administered medications on file prior to visit.    BP 100/68 (BP Location: Left Arm, Patient Position: Sitting, Cuff Size: Normal)   Pulse 87   Temp 98.3 F (36.8 C) (Oral)   Resp 16   Ht 5\' 10"  (1.778 m)   Wt 153 lb 6.4 oz (69.6 kg)   SpO2 100%   BMI 22.01 kg/m        Objective:   Physical Exam  General- No acute distress. Pleasant patient. Neck- Full range of motion, no jvd Lungs- Clear, even and unlabored. Heart- regular rate and rhythm. Neurologic- CNII- XII grossly intact.  Back- no cva tenderness. Abdomen- soft, nt, nd, +bs, no rebound or gaurding. No organomegaly. No suprapubic tenderness.      Assessment & Plan:   Patient Instructions  Recurrent urinary tract infections (dysuria yesterday) Recurrent UTIs with E. coli. Symptoms improved today. Discussed Macrobid  post-coital prophylaxis. - Prescribe Macrobid  100 mg twice daily for 7 days. - Send prescription for Macrobid  for post-coital prophylaxis, 10 tablets with one refill. - Update when culture results are available.  Follow up date to be determined after lab review.

## 2023-12-28 ENCOUNTER — Encounter: Payer: Self-pay | Admitting: Medical

## 2023-12-28 LAB — URINE CULTURE
MICRO NUMBER:: 16400650
SPECIMEN QUALITY:: ADEQUATE

## 2023-12-28 MED ORDER — NITROFURANTOIN MONOHYD MACRO 100 MG PO CAPS
100.0000 mg | ORAL_CAPSULE | Freq: Every day | ORAL | 2 refills | Status: DC
Start: 2023-12-28 — End: 2024-04-04
  Filled 2023-12-28 – 2024-01-06 (×2): qty 10, 10d supply, fill #0
  Filled 2024-01-30: qty 10, 10d supply, fill #1
  Filled 2024-02-27: qty 10, 10d supply, fill #2

## 2023-12-28 NOTE — Addendum Note (Signed)
 Addended by: Serafina Damme on: 12/28/2023 05:13 PM   Modules accepted: Orders

## 2023-12-29 ENCOUNTER — Other Ambulatory Visit (HOSPITAL_BASED_OUTPATIENT_CLINIC_OR_DEPARTMENT_OTHER): Payer: Self-pay

## 2024-01-06 ENCOUNTER — Other Ambulatory Visit (HOSPITAL_BASED_OUTPATIENT_CLINIC_OR_DEPARTMENT_OTHER): Payer: Self-pay

## 2024-01-30 ENCOUNTER — Other Ambulatory Visit: Payer: Self-pay | Admitting: Medical

## 2024-01-31 ENCOUNTER — Other Ambulatory Visit: Payer: Self-pay

## 2024-01-31 ENCOUNTER — Encounter: Payer: Self-pay | Admitting: Medical

## 2024-01-31 NOTE — Telephone Encounter (Signed)
 Requesting: lorazepam  0.5mg   Contract: None UDS: None Last Visit: 12/26/23 Next Visit: None Last Refill: 06/13/23 #4 and 0RF   Please Advise

## 2024-02-02 MED ORDER — LORAZEPAM 0.5 MG PO TABS
0.5000 mg | ORAL_TABLET | Freq: Three times a day (TID) | ORAL | 0 refills | Status: DC | PRN
  Filled 2024-02-02: qty 4, 2d supply, fill #0

## 2024-02-02 NOTE — Telephone Encounter (Signed)
 Rx refill sent to pharmacy.

## 2024-02-03 ENCOUNTER — Other Ambulatory Visit (HOSPITAL_BASED_OUTPATIENT_CLINIC_OR_DEPARTMENT_OTHER): Payer: Self-pay

## 2024-03-06 ENCOUNTER — Other Ambulatory Visit: Payer: Self-pay | Admitting: Neurology

## 2024-03-09 NOTE — Telephone Encounter (Signed)
 Last seen on 11/28/23 Follow up scheduled on 07/07/24

## 2024-03-10 ENCOUNTER — Other Ambulatory Visit: Payer: Self-pay | Admitting: Neurology

## 2024-03-10 ENCOUNTER — Other Ambulatory Visit: Payer: Self-pay

## 2024-03-10 ENCOUNTER — Other Ambulatory Visit (HOSPITAL_BASED_OUTPATIENT_CLINIC_OR_DEPARTMENT_OTHER): Payer: Self-pay

## 2024-03-10 MED ORDER — AMPHETAMINE-DEXTROAMPHETAMINE 10 MG PO TABS
10.0000 mg | ORAL_TABLET | Freq: Two times a day (BID) | ORAL | 0 refills | Status: AC
  Filled 2024-03-10: qty 60, 30d supply, fill #0

## 2024-03-10 NOTE — Telephone Encounter (Signed)
 Last seen 11/28/23 and next f/u 07/07/24. Last refilled 12/06/23 #60.

## 2024-04-04 ENCOUNTER — Other Ambulatory Visit: Payer: Self-pay | Admitting: Medical

## 2024-04-06 ENCOUNTER — Other Ambulatory Visit (HOSPITAL_BASED_OUTPATIENT_CLINIC_OR_DEPARTMENT_OTHER): Payer: Self-pay

## 2024-04-06 MED ORDER — NITROFURANTOIN MONOHYD MACRO 100 MG PO CAPS
100.0000 mg | ORAL_CAPSULE | Freq: Every day | ORAL | 2 refills | Status: DC
  Filled 2024-04-06: qty 10, 10d supply, fill #0
  Filled 2024-05-06: qty 10, 10d supply, fill #1
  Filled 2024-06-17: qty 10, 10d supply, fill #2

## 2024-04-24 ENCOUNTER — Ambulatory Visit (INDEPENDENT_AMBULATORY_CARE_PROVIDER_SITE_OTHER): Admitting: Medical

## 2024-04-24 VITALS — BP 118/80 | HR 69 | Temp 98.3°F | Resp 16 | Ht 70.0 in | Wt 160.2 lb

## 2024-04-24 DIAGNOSIS — Z0000 Encounter for general adult medical examination without abnormal findings: Secondary | ICD-10-CM | POA: Diagnosis not present

## 2024-04-24 DIAGNOSIS — E538 Deficiency of other specified B group vitamins: Secondary | ICD-10-CM | POA: Diagnosis not present

## 2024-04-24 DIAGNOSIS — Z0184 Encounter for antibody response examination: Secondary | ICD-10-CM | POA: Diagnosis not present

## 2024-04-24 DIAGNOSIS — Z1159 Encounter for screening for other viral diseases: Secondary | ICD-10-CM | POA: Diagnosis not present

## 2024-04-24 DIAGNOSIS — E559 Vitamin D deficiency, unspecified: Secondary | ICD-10-CM | POA: Diagnosis not present

## 2024-04-24 LAB — COMPREHENSIVE METABOLIC PANEL WITH GFR
ALT: 11 U/L (ref 0–35)
AST: 15 U/L (ref 0–37)
Albumin: 4.6 g/dL (ref 3.5–5.2)
Alkaline Phosphatase: 46 U/L (ref 39–117)
BUN: 7 mg/dL (ref 6–23)
CO2: 28 meq/L (ref 19–32)
Calcium: 9.1 mg/dL (ref 8.4–10.5)
Chloride: 101 meq/L (ref 96–112)
Creatinine, Ser: 0.61 mg/dL (ref 0.40–1.20)
GFR: 109.9 mL/min (ref >60.00)
Glucose, Bld: 92 mg/dL (ref 70–99)
Potassium: 4 meq/L (ref 3.5–5.1)
Sodium: 138 meq/L (ref 135–145)
Total Bilirubin: 0.9 mg/dL (ref 0.2–1.2)
Total Protein: 6.8 g/dL (ref 6.0–8.3)

## 2024-04-24 LAB — CBC WITH DIFFERENTIAL/PLATELET
Basophils Absolute: 0 K/uL (ref 0.0–0.1)
Basophils Relative: 0.5 % (ref 0.0–3.0)
Eosinophils Absolute: 0.1 K/uL (ref 0.0–0.7)
Eosinophils Relative: 1.6 % (ref 0.0–5.0)
HCT: 36.8 % (ref 36.0–46.0)
Hemoglobin: 12.7 g/dL (ref 12.0–15.0)
Lymphocytes Relative: 29 % (ref 12.0–46.0)
Lymphs Abs: 1.9 K/uL (ref 0.7–4.0)
MCHC: 34.6 g/dL (ref 30.0–36.0)
MCV: 92.6 fl (ref 78.0–100.0)
Monocytes Absolute: 0.4 K/uL (ref 0.1–1.0)
Monocytes Relative: 6.6 % (ref 3.0–12.0)
Neutro Abs: 4 K/uL (ref 1.4–7.7)
Neutrophils Relative %: 62.3 % (ref 43.0–77.0)
Platelets: 179 K/uL (ref 150.0–400.0)
RBC: 3.98 Mil/uL (ref 3.87–5.11)
RDW: 13.1 % (ref 11.5–15.5)
WBC: 6.5 K/uL (ref 4.0–10.5)

## 2024-04-24 LAB — LIPID PANEL
Cholesterol: 176 mg/dL (ref 0–200)
HDL: 65 mg/dL (ref >39.00)
LDL Cholesterol: 100 mg/dL — ABNORMAL HIGH (ref 0–99)
NonHDL: 110.64
Total CHOL/HDL Ratio: 3
Triglycerides: 54 mg/dL (ref 0.0–149.0)
VLDL: 10.8 mg/dL (ref 0.0–40.0)

## 2024-04-24 LAB — VITAMIN B12: Vitamin B-12: 166 pg/mL — ABNORMAL LOW (ref 211–911)

## 2024-04-24 LAB — VITAMIN D 25 HYDROXY (VIT D DEFICIENCY, FRACTURES): VITD: 31.23 ng/mL (ref 30.00–100.00)

## 2024-04-24 NOTE — Patient Instructions (Addendum)
 For you wellness exam today I have ordered cbc, cmp, hep b surface antibody, hep c antibody, b12, vit d and lipid panel.  Flu vaccine thru work.  Recommend exercise and healthy diet.  We will let you know lab results as they come in.  Follow up date appointment will be determined after lab review.     Preventive Care 10-43 Years Old, Female Preventive care refers to lifestyle choices and visits with your health care provider that can promote health and wellness. Preventive care visits are also called wellness exams. What can I expect for my preventive care visit? Counseling Your health care provider may ask you questions about your: Medical history, including: Past medical problems. Family medical history. Pregnancy history. Current health, including: Menstrual cycle. Method of birth control. Emotional well-being. Home life and relationship well-being. Sexual activity and sexual health. Lifestyle, including: Alcohol, nicotine or tobacco, and drug use. Access to firearms. Diet, exercise, and sleep habits. Work and work Astronomer. Sunscreen use. Safety issues such as seatbelt and bike helmet use. Physical exam Your health care provider will check your: Height and weight. These may be used to calculate your BMI (body mass index). BMI is a measurement that tells if you are at a healthy weight. Waist circumference. This measures the distance around your waistline. This measurement also tells if you are at a healthy weight and may help predict your risk of certain diseases, such as type 2 diabetes and high blood pressure. Heart rate and blood pressure. Body temperature. Skin for abnormal spots. What immunizations do I need?  Vaccines are usually given at various ages, according to a schedule. Your health care provider will recommend vaccines for you based on your age, medical history, and lifestyle or other factors, such as travel or where you work. What tests do I  need? Screening Your health care provider may recommend screening tests for certain conditions. This may include: Lipid and cholesterol levels. Diabetes screening. This is done by checking your blood sugar (glucose) after you have not eaten for a while (fasting). Pelvic exam and Pap test. Hepatitis B test. Hepatitis C test. HIV (human immunodeficiency virus) test. STI (sexually transmitted infection) testing, if you are at risk. Lung cancer screening. Colorectal cancer screening. Mammogram. Talk with your health care provider about when you should start having regular mammograms. This may depend on whether you have a family history of breast cancer. BRCA-related cancer screening. This may be done if you have a family history of breast, ovarian, tubal, or peritoneal cancers. Bone density scan. This is done to screen for osteoporosis. Talk with your health care provider about your test results, treatment options, and if necessary, the need for more tests. Follow these instructions at home: Eating and drinking  Eat a diet that includes fresh fruits and vegetables, whole grains, lean protein, and low-fat dairy products. Take vitamin and mineral supplements as recommended by your health care provider. Do not drink alcohol if: Your health care provider tells you not to drink. You are pregnant, may be pregnant, or are planning to become pregnant. If you drink alcohol: Limit how much you have to 0-1 drink a day. Know how much alcohol is in your drink. In the [place] one drink equals one 12 oz bottle of beer (355 mL), one 5 oz glass of wine (148 mL), or one 1 oz glass of hard liquor (44 mL). Lifestyle Brush your teeth every morning and night with fluoride toothpaste. Floss one time each day. Exercise for at least  30 minutes 5 or more days each week. Do not use any products that contain nicotine or tobacco. These products include cigarettes, chewing tobacco, and vaping devices, such as  e-cigarettes. If you need help quitting, ask your health care provider. Do not use drugs. If you are sexually active, practice safe sex. Use a condom or other form of protection to prevent STIs. If you do not wish to become pregnant, use a form of birth control. If you plan to become pregnant, see your health care provider for a prepregnancy visit. Take aspirin only as told by your health care provider. Make sure that you understand how much to take and what form to take. Work with your health care provider to find out whether it is safe and beneficial for you to take aspirin daily. Find healthy ways to manage stress, such as: Meditation, yoga, or listening to music. Journaling. Talking to a trusted person. Spending time with friends and family. Minimize exposure to UV radiation to reduce your risk of skin cancer. Safety Always wear your seat belt while driving or riding in a vehicle. Do not drive: If you have been drinking alcohol. Do not ride with someone who has been drinking. When you are tired or distracted. While texting. If you have been using any mind-altering substances or drugs. Wear a helmet and other protective equipment during sports activities. If you have firearms in your house, make sure you follow all gun safety procedures. Seek help if you have been physically or sexually abused. What's next? Visit your health care provider once a year for an annual wellness visit. Ask your health care provider how often you should have your eyes and teeth checked. Stay up to date on all vaccines. This information is not intended to replace advice given to you by your health care provider. Make sure you discuss any questions you have with your health care provider. Document Revised: 02/08/2021 Document Reviewed: 02/08/2021 Elsevier Patient Education  2024 [employer].

## 2024-04-24 NOTE — Progress Notes (Signed)
 Subjective:    Patient ID: Karen Powers, female    DOB: 1981-05-07, 43 y.o.   MRN: [id number]  HPI Pt in for wellness.   Pt is fasting. Works as Engineer, civil (consulting). Pt not exercising. Pt weight has been stable in historical range. Eating healthier. 3 weeks ago stopped 4 sodas a week.. No alcohol and non smoker.   Will get flu vaccine thru work   Hep b surface antibody and hep c antibody today.  Pt has vit d deficiency nd b12 deficiency. Stopped taking supplementation back in July. Was on 50,000 internation units vit d weekly and b12 1000 mcg daily.     Review of Systems  Constitutional:  Negative for chills, fatigue and fever.  Respiratory:  Negative for cough, chest tightness, shortness of breath and wheezing.   Cardiovascular:  Negative for chest pain and palpitations.  Gastrointestinal:  Negative for abdominal pain, blood in stool, diarrhea, nausea and vomiting.  Genitourinary:  Negative for dysuria, frequency and hematuria.  Musculoskeletal:  Negative for back pain, joint swelling, myalgias and neck stiffness.  Skin:  Negative for rash.  Neurological:  Negative for dizziness, speech difficulty and light-headedness.  Hematological:  Negative for adenopathy. Does not bruise/bleed easily.  Psychiatric/Behavioral:  Negative for behavioral problems and confusion. The patient is not nervous/anxious.     Past Medical History:  Diagnosis Date   Acute blood loss anemia 01/06/2016   Anemia    Diabetes mellitus without complication (HCC) 2015   Gestational diabetes only.   Dysrhythmia    Gallstones    Gestational diabetes    2nd preg only   Headache(784.0)    otc med prn   Heartburn in pregnancy    IBS (irritable bowel syndrome)    Maternal iron  deficiency anemia 01/06/2016   MS (multiple sclerosis) (HCC)    Postpartum care following cesarean delivery (5/11) 01/05/2016   Postpartum hypertension 03/07/2019   POTS (postural orthostatic tachycardia syndrome)    SVT (supraventricular  tachycardia) (HCC)    No problems since 10/2012 - no med needed   Vaginal Pap smear, abnormal    Vitamin deficiency      Social History   Socioeconomic History   Marital status: Married    Spouse name: Elonna Mcfarlane   Number of children: 4   Years of education: Not on file   Highest education level: Bachelor's degree (e.g., BA, AB, BS)  Occupational History   Occupation: [employer] nurse   Occupation: RN  Tobacco Use   Smoking status: Never   Smokeless tobacco: Never  Vaping Use   Vaping status: Never Used  Substance and Sexual Activity   Alcohol use: No   Drug use: No   Sexual activity: Yes    Birth control/protection: Pill    Comment: pregnant  Other Topics Concern   Not on file  Social History Narrative   Soda daily   Right handed    Lives with husband and 4 kids   She is ER nurse       Social Drivers of Health   Financial Resource Strain: Low Risk  (04/24/2024)   Overall Financial Resource Strain (CARDIA)    Difficulty of Paying Living Expenses: Not hard at all  Food Insecurity: No Food Insecurity (04/24/2024)   Hunger Vital Sign    Worried About Running Out of Food in the Last Year: Never true    Ran Out of Food in the Last Year: Never true  Transportation Needs: No Transportation Needs (04/24/2024)  PRAPARE - Administrator, Civil Service (Medical): No    Lack of Transportation (Non-Medical): No  Physical Activity: Insufficiently Active (04/24/2024)   Exercise Vital Sign    Days of Exercise per Week: 1 day    Minutes of Exercise per Session: 20 min  Stress: No Stress Concern Present (04/24/2024)   [employer] of Occupational Health - Occupational Stress Questionnaire    Feeling of Stress: Only a little  Social Connections: Moderately Isolated (04/24/2024)   Social Connection and Isolation Panel    Frequency of Communication with Friends and Family: Twice a week    Frequency of Social Gatherings with Friends and Family: Once a week     Attends Religious Services: Never    Database administrator or Organizations: No    Attends Engineer, structural: Not on file    Marital Status: Married  Catering manager Violence: Not on file    Past Surgical History:  Procedure Laterality Date   ATRIAL ABLATION SURGERY  2003   FAILED   CESAREAN SECTION  2006   Breech   CESAREAN SECTION  2006   breech   CESAREAN SECTION N/A 12/19/2013   Procedure: REPEAT CESAREAN SECTION;  Surgeon: Charlie JINNY Flowers, MD;  Location: [facility];  Service: Obstetrics;  Laterality: N/A;  EDD: 12/25/13   CESAREAN SECTION N/A 01/05/2016   Procedure: Repeat CESAREAN SECTION;  Surgeon: Charlie Flowers, MD;  Location: [facility];  Service: Obstetrics;  Laterality: N/A;  EDD: 01/12/16    CESAREAN SECTION WITH BILATERAL TUBAL LIGATION Bilateral 02/25/2019   Procedure: Repeat CESAREAN SECTION WITH BILATERAL TUBAL LIGATION;  Surgeon: Flowers Charlie, MD;  Location: [facility];  Service: Obstetrics;  Laterality: Bilateral;  Heather, RNFA  EDD: 03/04/19 Allergy: Adhesive, Paxil, Cipro   CHOLECYSTECTOMY N/A 08/13/2019   Procedure: LAPAROSCOPIC CHOLECYSTECTOMY WITH  CHOLANGIOGRAM;  Surgeon: Tanda Locus, MD;  Location: [facility];  Service: General;  Laterality: N/A;   COLPOSCOPY     WISDOM TOOTH EXTRACTION      Family History  Problem Relation Age of Onset   Multiple sclerosis Mother    Migraines Mother    Emphysema Father    Heart disease Father    Migraines Father    Bladder Cancer Father    Lung cancer Father    Migraines Sister    Spina bifida Brother        half brother   Celiac disease Brother    Diabetes Maternal Grandmother    Colon cancer Maternal Grandmother    Cancer Paternal Grandfather        LUNG   Esophageal cancer Neg Hx    Stomach cancer Neg Hx     Allergies  Allergen Reactions   Ciprofloxacin Rash and Other (See Comments)    Has patient had a PCN reaction causing immediate rash, facial/tongue/throat swelling, SOB or  lightheadedness with hypotension: No Has patient had a PCN reaction causing severe rash involving mucus membranes or skin necrosis: No Has patient had a PCN reaction that required hospitalization No Has patient had a PCN reaction occurring within the last 10 years: Yes If all of the above answers are NO, then may proceed with Cephalosporin use.    Current Outpatient Medications on File Prior to Visit  Medication Sig Dispense Refill   acetaminophen  (TYLENOL ) 500 MG tablet Take 1,000 mg by mouth every 6 (six) hours as needed for moderate pain or headache.     amphetamine -dextroamphetamine  (ADDERALL) 10 MG tablet Take 1  tablet (10 mg total) by mouth every morning and then take 1 tablet (10 mg total) by mouth 4 hours later. 60 tablet 0   baclofen  (LIORESAL ) 10 MG tablet Take 0.5-1 tablets (5-10 mg total) by mouth 3 (three) times daily. 90 each 2   Diroximel Fumarate  (VUMERITY ) 231 MG CPDR Take 231 mg by mouth in the morning and at bedtime. 60 capsule 0   ibuprofen  (ADVIL ) 200 MG tablet Take 800 mg by mouth every 6 (six) hours as needed for headache or mild pain.     LORazepam  (ATIVAN ) 0.5 MG tablet Take 1 tablet (0.5 mg total) by mouth every 8 (eight) hours as needed for anxiety. 4 tablet 0   meloxicam  (MOBIC ) 7.5 MG tablet Take 1 tablet (7.5 mg total) by mouth daily. 30 tablet 0   nitrofurantoin , macrocrystal-monohydrate, (MACROBID ) 100 MG capsule Take 1 capsule (100 mg total) by mouth at bedtime. 10 capsule 2   ondansetron  (ZOFRAN ) 4 MG tablet Take 1-2 tablets (4-8 mg total) by mouth every 8 (eight) hours as needed for nausea or vomiting. 30 tablet 0   traZODone  (DESYREL ) 50 MG tablet Take 1 tablet (50 mg total) by mouth at bedtime. 30 tablet 7   Vitamin D , Ergocalciferol , (DRISDOL ) 1.25 MG (50000 UNIT) CAPS capsule Take 1 capsule (50,000 Units total) by mouth every 7 (seven) days. 13 capsule 1   VUMERITY  231 MG CPDR TAKE 2 CAPSULES BY MOUTH 2 TIMES A DAY 120 capsule 3   No current  facility-administered medications on file prior to visit.    BP 118/80   Pulse 69   Temp 98.3 F (36.8 C) (Oral)   Resp 16   Ht 5' 10 (1.778 m)   LMP 03/31/2024   SpO2 99%   Breastfeeding No   BMI 22.01 kg/m        Objective:   Physical Exam   General Mental Status- Alert. General Appearance- Not in acute distress.   Skin General: Color- Normal Color. Moisture- Normal Moisture.  Neck Carotid Arteries- Normal color. Moisture- Normal Moisture. No carotid bruits. No JVD.  Chest and Lung Exam Auscultation: Breath Sounds:-Normal.  Cardiovascular Auscultation:Rythm- Regular. Murmurs & Other Heart Sounds:Auscultation of the heart reveals- No Murmurs.  Abdomen Inspection:-Inspeection Normal. Palpation/Percussion:Note:No mass. Palpation and Percussion of the abdomen reveal- Non Tender, Non Distended + BS, no rebound or guarding.   Neurologic Cranial Nerve exam:- CN III-XII intact(No nystagmus), symmetric smile. Strength:- 5/5 equal and symmetric strength both upper and lower extremities.      Assessment & Plan:   For you wellness exam today I have ordered cbc, cmp, hep b surface antibody, hep c antibody, b12, vit d and lipid panel.  Flu vaccine thru work.  Recommend exercise and healthy diet.  We will let you know lab results as they come in.  Follow up date appointment will be determined after lab review.    Dhruv Christina, PA-C

## 2024-04-25 ENCOUNTER — Ambulatory Visit: Payer: Self-pay | Admitting: Medical

## 2024-04-25 LAB — HEPATITIS B SURFACE ANTIBODY, QUANTITATIVE: Hep B S AB Quant (Post): 13 m[IU]/mL (ref >=10)

## 2024-04-25 LAB — HEPATITIS C ANTIBODY: Hepatitis C Ab: NONREACTIVE

## 2024-04-25 MED ORDER — VITAMIN D (ERGOCALCIFEROL) 1.25 MG (50000 UNIT) PO CAPS
50000.0000 [IU] | ORAL_CAPSULE | ORAL | 0 refills | Status: AC
  Filled 2024-04-25 – 2024-06-17 (×2): qty 8, 56d supply, fill #0

## 2024-04-25 NOTE — Addendum Note (Signed)
 Addended by: DORINA DALLAS HERO on: 04/25/2024 09:04 AM   Modules accepted: Orders

## 2024-04-26 ENCOUNTER — Other Ambulatory Visit (HOSPITAL_BASED_OUTPATIENT_CLINIC_OR_DEPARTMENT_OTHER): Payer: Self-pay

## 2024-04-27 ENCOUNTER — Other Ambulatory Visit (HOSPITAL_BASED_OUTPATIENT_CLINIC_OR_DEPARTMENT_OTHER): Payer: Self-pay

## 2024-06-17 ENCOUNTER — Other Ambulatory Visit (HOSPITAL_BASED_OUTPATIENT_CLINIC_OR_DEPARTMENT_OTHER): Payer: Self-pay

## 2024-06-22 ENCOUNTER — Other Ambulatory Visit: Payer: Self-pay | Admitting: Neurology

## 2024-06-22 ENCOUNTER — Encounter: Payer: Self-pay | Admitting: Neurology

## 2024-06-22 ENCOUNTER — Other Ambulatory Visit (HOSPITAL_BASED_OUTPATIENT_CLINIC_OR_DEPARTMENT_OTHER): Payer: Self-pay

## 2024-06-22 DIAGNOSIS — G35D Multiple sclerosis, unspecified: Secondary | ICD-10-CM

## 2024-06-22 DIAGNOSIS — G35A Relapsing-remitting multiple sclerosis: Secondary | ICD-10-CM

## 2024-06-22 DIAGNOSIS — R208 Other disturbances of skin sensation: Secondary | ICD-10-CM

## 2024-06-22 MED ORDER — GABAPENTIN 300 MG PO CAPS
300.0000 mg | ORAL_CAPSULE | Freq: Three times a day (TID) | ORAL | 3 refills | Status: AC
  Filled 2024-06-22: qty 90, 30d supply, fill #0
  Filled 2024-09-09: qty 90, 30d supply, fill #1

## 2024-06-24 ENCOUNTER — Other Ambulatory Visit

## 2024-06-24 DIAGNOSIS — R208 Other disturbances of skin sensation: Secondary | ICD-10-CM

## 2024-06-24 DIAGNOSIS — G35D Multiple sclerosis, unspecified: Secondary | ICD-10-CM

## 2024-06-24 DIAGNOSIS — G35A Relapsing-remitting multiple sclerosis: Secondary | ICD-10-CM | POA: Diagnosis not present

## 2024-06-24 MED ORDER — GADOBENATE DIMEGLUMINE 529 MG/ML IV SOLN
15.0000 mL | Freq: Once | INTRAVENOUS | Status: AC | PRN
  Administered 2024-06-24: 15 mL via INTRAVENOUS

## 2024-06-26 ENCOUNTER — Ambulatory Visit: Payer: Self-pay | Admitting: Neurology

## 2024-07-03 ENCOUNTER — Other Ambulatory Visit: Payer: Self-pay | Admitting: Neurology

## 2024-07-06 NOTE — Telephone Encounter (Signed)
 Last seen on 11/28/23 Follow up scheduled on 07/07/24

## 2024-07-07 ENCOUNTER — Encounter: Payer: Self-pay | Admitting: Neurology

## 2024-07-07 ENCOUNTER — Ambulatory Visit: Admitting: Neurology

## 2024-07-07 VITALS — BP 116/83 | HR 76 | Ht 70.0 in | Wt 163.0 lb

## 2024-07-07 DIAGNOSIS — N399 Disorder of urinary system, unspecified: Secondary | ICD-10-CM

## 2024-07-07 DIAGNOSIS — G35A Relapsing-remitting multiple sclerosis: Secondary | ICD-10-CM

## 2024-07-07 DIAGNOSIS — R208 Other disturbances of skin sensation: Secondary | ICD-10-CM | POA: Diagnosis not present

## 2024-07-07 DIAGNOSIS — F988 Other specified behavioral and emotional disorders with onset usually occurring in childhood and adolescence: Secondary | ICD-10-CM

## 2024-07-07 DIAGNOSIS — Z79899 Other long term (current) drug therapy: Secondary | ICD-10-CM | POA: Diagnosis not present

## 2024-07-07 DIAGNOSIS — R200 Anesthesia of skin: Secondary | ICD-10-CM | POA: Diagnosis not present

## 2024-07-07 NOTE — Progress Notes (Signed)
 [facility]  PATIENT: Karen Powers DOB: 01-02-1981  REFERRING DOCTOR OR PCP: Dallas Maxwell, PA-C SOURCE: Patient, notes from neurology, imaging and laboratory reports, MRI images personally reviewed.  _________________________________   HISTORICAL  CHIEF COMPLAINT:  Chief Complaint  Patient presents with   Follow-up    Pt in room 11. Alone. Here for MS follow up.    HISTORY OF PRESENT ILLNESS:  Karen Powers is a 43 y.o. woman with multiple sclerosis and treatment options.  Update 07/07/2024 She has had neuropathic type pain in the left flank.  It is uncomfortable with hyperpathia.  No rash occurred.    In the past sensory symptoms would usually last < 5 days (left shoulder, right hip region).  This one started 3 weeks ago and is just now starting to get better.  Distribution is about T5-T9.   We repeated imaging studies and no new foci in brain or cervical or thoracic spine  She is on Vumerity  and tolrates it well.  However, she sometimes misses doses and notes that she had missed quite a few doses before the current episode she started January 2023.   No GI upset though she has some flushing, especially the dose after a forgotten dose.      She denies other new neurologic issues.    She has had more spasms in her legs, often after she has been more active and then rests.  Baclofen  has helped and does not make her too sleepy.  She works night shifts.    MRI 4/232024 showed no new lesions  Gait is doing well.   She will use the bannister on stairs but can do without.  She also has knee issues.  No falls.   No weakness .     She has no urinary hesitancy but is not sure completely empties.  She has had a few UTIs    Sincee starting Azo-cranberry this is better.      Vision is ok though she has trouble focusing on words at time.    Her OD vision is generally worse than OS (even before MS)  Color vision is symmetric.  She notes some dysphagia and EGD  showed mild changes likely due to GERD.   A PPI was started.      She has more fatigue and some mild mental fog.    She has difficulty with focus/attention and Adderall helps.  She ofte takes before work Insomnia is better with trazodone ..     She is a engineer, civil (consulting).   She denies significant depression but occasionally feels down.       She works as Nutritional Therapist.  She has 4 kids.    MS Hstory She was diagnosed with MS in 2009 at age 54.   In September 2009, she was experiencing numbness in her right leg,   Her mother had MS prompting an evaluation with an MRI showing several periventricular T2 hyperintense foci and a focus in the left cerebral peduncle.    CSF was analyzed and was also consistent with MS,   She was diagnosed by Dr. Vinetta and then started to see Dr. Juliane in [place]   She was started on Copaxone  but only stayed on a couple years.   She was starting a family and had several more prognosis over the next 10 years..  She was seen [facility] Clarice at [facility].  In 2018, she had some visual changes and initiation of a disease modifying therapy was recommended  though she did not start due to insurance issues., She had additional neurologic symptoms January or February 2019.   She noted that when she would turn her head to the right, she would get a wet sensation in her right leg.    She then had an episode of severe vertigo a week or two later that persisted a couple weeks.  MRI 10/22/2017 showed several enhancing lesions in the cervical spine and no new lesions in the brain.  She received a few days of IV Solumedrol and was better 1-2 weeks later and symptoms resolved a week or two after that.     She was planning an additional pregnancy so held off a treatment   In December 2022, she felt more off balanced but MRI brain and spine showed no new lesions.   She has since improved and is back at baseline.  She started Vumerity  January 2023.  She has 4 children born 2006, 2015, 2017, and 2020.   She is not planning  additional children  Imaging:  MRI of the brain 06/24/2024 showed no new lesions.  Riot the cervical and thoracic spine 06/24/2024 showed T2 hyperintense foci to the left at C4 and a possible focus laterally to the right adjacent to C2-C3.  They were present on the MRI from 2022 as well.  Mild spinal stenosis at C5-C6 but no nerve root compression.  MRI of the brain 12/18/2022 showed Scattered T2/FLAIR hypertense foci in the cerebral hemispheres in a pattern consistent with chronic demyelinating plaque associated with multiple sclerosis. None of the foci appeared acute. They did not enhance. Compared to the MRI from 08/04/2021, there were no new lesions.   MRI of the brain 08/04/2021 showed scattered T2/FLAIR hyperintense foci predominantly in the periventricular white matter.  None of the foci enhance or appear to be acute.  No new lesions compared to 10/22/2017  MRI of the cervical and thoracic spine 08/04/2021 showed subtle T2 hyperintense foci in the upper cervical spine, with improved appearance compared to the 2019 MRI.  No enhancing lesions.  No additional lesions.  The MRI of the thoracic spine did not show any MS lesions.  MRI of the cervical spine 10/22/2017 showed several enhancing lesions in the cervical spine, to the left adjacent to C2, anteriorly adjacent to C2 and to the right adjacent to C2-C3.  An additional nonenhancing lesion is noted to the left adjacent to C4.  None of these foci were present on the MRI from 07/13/2017 (normal spinal cord)  MRI of the brain 10/22/2017 showed scattered T2/FLAIR hyperintense foci in the hemispheres, predominantly in the periventricular white matter.  None of the foci enhance.  No definite new lesions compared to 07/13/2017.  MRI of the brain 07/13/2017 showed scattered T2/FLAIR hyperintense foci.  Dominantly in the periventricular white matter with a subtle focus in the left cerebral peduncle.  There has been mild progression compared to the 10/20/2010  MRI.  MRI of the brain 10/20/2010 and 05/17/2008 showed T2/FLAIR hyperintense foci predominantly in the periventricular white matter.  These 2 studies were stable.  REVIEW OF SYSTEMS: Constitutional: No fevers, chills, sweats, or change in appetite Eyes: No visual changes, double vision, eye pain Ear, nose and throat: No hearing loss, ear pain, nasal congestion, sore throat Cardiovascular: No chest pain, palpitations Respiratory:  No shortness of breath at rest or with exertion.   No wheezes GastrointestinaI: No nausea, vomiting, diarrhea, abdominal pain, fecal incontinence Genitourinary:  No dysuria, urinary retention or frequency.  No  nocturia. Musculoskeletal:  No neck pain, back pain Integumentary: No rash, pruritus, skin lesions Neurological: as above Psychiatric: No depression at this time.  No anxiety Endocrine: No palpitations, diaphoresis, change in appetite, change in weigh or increased thirst Hematologic/Lymphatic:  No anemia, purpura, petechiae. Allergic/Immunologic: No itchy/runny eyes, nasal congestion, recent allergic reactions, rashes  ALLERGIES: Allergies  Allergen Reactions   Ciprofloxacin Rash and Other (See Comments)    Has patient had a PCN reaction causing immediate rash, facial/tongue/throat swelling, SOB or lightheadedness with hypotension: No Has patient had a PCN reaction causing severe rash involving mucus membranes or skin necrosis: No Has patient had a PCN reaction that required hospitalization No Has patient had a PCN reaction occurring within the last 10 years: Yes If all of the above answers are NO, then may proceed with Cephalosporin use.    HOME MEDICATIONS:  Current Outpatient Medications:    acetaminophen  (TYLENOL ) 500 MG tablet, Take 1,000 mg by mouth every 6 (six) hours as needed for moderate pain or headache., Disp: , Rfl:    amphetamine -dextroamphetamine  (ADDERALL) 10 MG tablet, Take 1 tablet (10 mg total) by mouth every morning and then take  1 tablet (10 mg total) by mouth 4 hours later., Disp: 60 tablet, Rfl: 0   baclofen  (LIORESAL ) 10 MG tablet, Take 0.5-1 tablets (5-10 mg total) by mouth 3 (three) times daily., Disp: 90 each, Rfl: 2   Diroximel Fumarate  (VUMERITY ) 231 MG CPDR, Take 231 mg by mouth in the morning and at bedtime., Disp: 60 capsule, Rfl: 0   gabapentin  (NEURONTIN ) 300 MG capsule, Take 1 capsule (300 mg total) by mouth 3 (three) times daily., Disp: 90 capsule, Rfl: 3   ibuprofen  (ADVIL ) 200 MG tablet, Take 800 mg by mouth every 6 (six) hours as needed for headache or mild pain., Disp: , Rfl:    LORazepam  (ATIVAN ) 0.5 MG tablet, Take 1 tablet (0.5 mg total) by mouth every 8 (eight) hours as needed for anxiety., Disp: 4 tablet, Rfl: 0   meloxicam  (MOBIC ) 7.5 MG tablet, Take 1 tablet (7.5 mg total) by mouth daily., Disp: 30 tablet, Rfl: 0   nitrofurantoin , macrocrystal-monohydrate, (MACROBID ) 100 MG capsule, Take 1 capsule (100 mg total) by mouth at bedtime., Disp: 10 capsule, Rfl: 2   ondansetron  (ZOFRAN ) 4 MG tablet, Take 1-2 tablets (4-8 mg total) by mouth every 8 (eight) hours as needed for nausea or vomiting., Disp: 30 tablet, Rfl: 0   traZODone  (DESYREL ) 50 MG tablet, Take 1 tablet (50 mg total) by mouth at bedtime., Disp: 30 tablet, Rfl: 7   Vitamin D , Ergocalciferol , (DRISDOL ) 1.25 MG (50000 UNIT) CAPS capsule, Take 1 capsule (50,000 Units total) by mouth every 7 (seven) days., Disp: 8 capsule, Rfl: 0   VUMERITY  231 MG CPDR, TAKE 2 CAPSULES BY MOUTH 2 TIMES A DAY, Disp: 120 capsule, Rfl: 3  PAST MEDICAL HISTORY: Past Medical History:  Diagnosis Date   Acute blood loss anemia 01/06/2016   Anemia    Diabetes mellitus without complication (HCC) 2015   Gestational diabetes only.   Dysrhythmia    Gallstones    Gestational diabetes    2nd preg only   Headache(784.0)    otc med prn   Heartburn in pregnancy    IBS (irritable bowel syndrome)    Maternal iron  deficiency anemia 01/06/2016   MS (multiple sclerosis)     Postpartum care following cesarean delivery (5/11) 01/05/2016   Postpartum hypertension 03/07/2019   POTS (postural orthostatic tachycardia syndrome)    SVT (  supraventricular tachycardia)    No problems since 10/2012 - no med needed   Vaginal Pap smear, abnormal    Vitamin deficiency     PAST SURGICAL HISTORY: Past Surgical History:  Procedure Laterality Date   ATRIAL ABLATION SURGERY  2003   FAILED   CESAREAN SECTION  2006   Breech   CESAREAN SECTION  2006   breech   CESAREAN SECTION N/A 12/19/2013   Procedure: REPEAT CESAREAN SECTION;  Surgeon: Charlie JINNY Flowers, MD;  Location: [facility];  Service: Obstetrics;  Laterality: N/A;  EDD: 12/25/13   CESAREAN SECTION N/A 01/05/2016   Procedure: Repeat CESAREAN SECTION;  Surgeon: Charlie Flowers, MD;  Location: [facility];  Service: Obstetrics;  Laterality: N/A;  EDD: 01/12/16    CESAREAN SECTION WITH BILATERAL TUBAL LIGATION Bilateral 02/25/2019   Procedure: Repeat CESAREAN SECTION WITH BILATERAL TUBAL LIGATION;  Surgeon: Flowers Charlie, MD;  Location: [facility];  Service: Obstetrics;  Laterality: Bilateral;  Heather, RNFA  EDD: 03/04/19 Allergy: Adhesive, Paxil, Cipro   CHOLECYSTECTOMY N/A 08/13/2019   Procedure: LAPAROSCOPIC CHOLECYSTECTOMY WITH  CHOLANGIOGRAM;  Surgeon: Tanda Locus, MD;  Location: [facility];  Service: General;  Laterality: N/A;   COLPOSCOPY     WISDOM TOOTH EXTRACTION      FAMILY HISTORY: Family History  Problem Relation Age of Onset   Multiple sclerosis Mother    Migraines Mother    Emphysema Father    Heart disease Father    Migraines Father    Bladder Cancer Father    Lung cancer Father    Migraines Sister    Spina bifida Brother        half brother   Celiac disease Brother    Diabetes Maternal Grandmother    Colon cancer Maternal Grandmother    Cancer Paternal Grandfather        LUNG   Esophageal cancer Neg Hx    Stomach cancer Neg Hx     SOCIAL HISTORY:  Social History   Socioeconomic History    Marital status: Married    Spouse name: Maziyah Vessel   Number of children: 4   Years of education: Not on file   Highest education level: Bachelor's degree (e.g., BA, AB, BS)  Occupational History   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   Occupation: RN  Tobacco Use   Smoking status: Never   Smokeless tobacco: Never  Vaping Use   Vaping status: Never Used  Substance and Sexual Activity   Alcohol use: No   Drug use: No   Sexual activity: Yes    Birth control/protection: Pill    Comment: pregnant  Other Topics Concern   Not on file  Social History Narrative   Soda daily   Right handed    Lives with husband and 4 kids   She is ER nurse       Social Drivers of Health   Financial Resource Strain: Low Risk  (04/24/2024)   Overall Financial Resource Strain (CARDIA)    Difficulty of Paying Living Expenses: Not hard at all  Food Insecurity: No Food Insecurity (04/24/2024)   Hunger Vital Sign    Worried About Running Out of Food in the Last Year: Never true    Ran Out of Food in the Last Year: Never true  Transportation Needs: No Transportation Needs (04/24/2024)   PRAPARE - Administrator, Civil Service (Medical): No    Lack of Transportation (Non-Medical): No  Physical Activity: Insufficiently Active (04/24/2024)   Exercise  Vital Sign    Days of Exercise per Week: 1 day    Minutes of Exercise per Session: 20 min  Stress: No Stress Concern Present (04/24/2024)   [employer] of Occupational Health - Occupational Stress Questionnaire    Feeling of Stress: Only a little  Social Connections: Moderately Isolated (04/24/2024)   Social Connection and Isolation Panel    Frequency of Communication with Friends and Family: Twice a week    Frequency of Social Gatherings with Friends and Family: Once a week    Attends Religious Services: Never    Database Administrator or Organizations: No    Attends Engineer, Structural: Not on file    Marital Status: Married   Catering Manager Violence: Not on file     PHYSICAL EXAM  Vitals:   07/07/24 1011  BP: 116/83  Pulse: 76  Weight: 163 lb (73.9 kg)  Height: 5' 10 (1.778 m)    Body mass index is 23.39 kg/m.   General: The patient is well-developed and well-nourished and in no acute distress  HEENT:  Head is [place]/AT.  Sclera are anicteric.    Skin: Extremities are without rash or  edema.  Musculoskeletal:  Back is nontender  Neurologic Exam  Mental status: The patient is alert and oriented x 3 at the time of the examination. The patient has apparent normal recent and remote memory, with an apparently normal attention span and concentration ability.   Speech is normal.  Cranial nerves: Extraocular movements are full.  Facial strength and sensation was normal .  No dysarthria is noted.  . No obvious hearing deficits are noted.  Motor:  Muscle bulk is normal.   Tone is normal. Strength is  5 / 5 in all 4 extremities.   Sensory: She has a stretch of altered sensation with hyperpathia in the T5-T9 dermatomes on the left.  Sensory testing is intact to pinprick, soft touch and vibration sensation in all 4 extremities.  Coordination: Cerebellar testing reveals good finger-nose-finger and heel-to-shin bilaterally.  Gait and station: Station is normal.   Gait is normal. Tandem gait is slightly wide. Romberg is negative.   Reflexes: Deep tendon reflexes are symmetric and normal bilaterally.       DIAGNOSTIC DATA (LABS, IMAGING, TESTING) - I reviewed patient records, labs, notes, testing and imaging myself where available.  Lab Results  Component Value Date   WBC 6.5 04/24/2024   HGB 12.7 04/24/2024   HCT 36.8 04/24/2024   MCV 92.6 04/24/2024   PLT 179.0 04/24/2024      Component Value Date/Time   NA 138 04/24/2024 1144   NA 139 11/28/2023 0955   K 4.0 04/24/2024 1144   CL 101 04/24/2024 1144   CO2 28 04/24/2024 1144   GLUCOSE 92 04/24/2024 1144   BUN 7 04/24/2024 1144   BUN 9  11/28/2023 0955   CREATININE 0.61 04/24/2024 1144   CREATININE 0.78 03/30/2020 0838   CALCIUM 9.1 04/24/2024 1144   PROT 6.8 04/24/2024 1144   PROT 6.1 11/28/2023 0955   ALBUMIN 4.6 04/24/2024 1144   ALBUMIN 4.2 11/28/2023 0955   AST 15 04/24/2024 1144   ALT 11 04/24/2024 1144   ALKPHOS 46 04/24/2024 1144   BILITOT 0.9 04/24/2024 1144   BILITOT 0.5 11/28/2023 0955   GFRNONAA >60 08/03/2021 2215   GFRAA >60 08/13/2019 0420   Lab Results  Component Value Date   CHOL 176 04/24/2024   HDL 65.00 04/24/2024   LDLCALC 100 (H)  04/24/2024   TRIG 54.0 04/24/2024   CHOLHDL 3 04/24/2024   No results found for: HGBA1C Lab Results  Component Value Date   VITAMINB12 166 (L) 04/24/2024   Lab Results  Component Value Date   TSH 0.97 09/19/2021       ASSESSMENT AND PLAN  Relapsing remitting multiple sclerosis  Dysesthesia  High risk medication use  Attention deficit disorder (ADD) in adult  Urinary disorder  Numbness   She is experience increased sensory symptoms that likely represent a small MS exacerbation.  Of note, though she is on Vumerity  she has missed notable dosages.  We discussed options and she is going to try to be more compliant with the medication.  If another event like this occurs, however, I would recommend that we switch to a different disease modifying therapy.   Check labs  Continue Adderall 10 mg po bid for MS related ADD as needed Stay active and exercise Rtc 6 months or sooner if new or worsening symptoms.   Ticia Virgo A. Vear, MD, [facility] 07/07/2024, 1:55 PM Certified in Neurology, Clinical Neurophysiology, Sleep Medicine and Neuroimaging  [facility] [address], [address] [place], [place] [address] [phone]

## 2024-07-15 ENCOUNTER — Other Ambulatory Visit: Payer: Self-pay | Admitting: Medical

## 2024-07-16 ENCOUNTER — Other Ambulatory Visit (HOSPITAL_BASED_OUTPATIENT_CLINIC_OR_DEPARTMENT_OTHER): Payer: Self-pay

## 2024-07-16 MED ORDER — LORAZEPAM 0.5 MG PO TABS
0.5000 mg | ORAL_TABLET | Freq: Three times a day (TID) | ORAL | 0 refills | Status: AC | PRN
  Filled 2024-07-16: qty 4, 2d supply, fill #0

## 2024-07-16 MED ORDER — NITROFURANTOIN MONOHYD MACRO 100 MG PO CAPS
100.0000 mg | ORAL_CAPSULE | Freq: Every day | ORAL | 2 refills | Status: AC
  Filled 2024-07-16: qty 10, 10d supply, fill #0
  Filled 2024-09-09: qty 10, 10d supply, fill #1

## 2024-07-16 NOTE — Telephone Encounter (Signed)
Rx refill sent to pt pharmacy 

## 2025-02-03 ENCOUNTER — Ambulatory Visit: Admitting: Neurology
